# Patient Record
Sex: Female | Born: 1975 | Race: White | Hispanic: No | Marital: Married | Smoking: Never smoker
Health system: Southern US, Community
[De-identification: ages and names within clinical notes are randomized; demographics above are authoritative.]

## PROBLEM LIST (undated history)

## (undated) DIAGNOSIS — J349 Unspecified disorder of nose and nasal sinuses: Secondary | ICD-10-CM

## (undated) DIAGNOSIS — R112 Nausea with vomiting, unspecified: Secondary | ICD-10-CM

## (undated) DIAGNOSIS — R519 Headache, unspecified: Secondary | ICD-10-CM

## (undated) DIAGNOSIS — F419 Anxiety disorder, unspecified: Secondary | ICD-10-CM

## (undated) DIAGNOSIS — Z9889 Other specified postprocedural states: Secondary | ICD-10-CM

## (undated) DIAGNOSIS — R42 Dizziness and giddiness: Secondary | ICD-10-CM

## (undated) DIAGNOSIS — N200 Calculus of kidney: Secondary | ICD-10-CM

## (undated) DIAGNOSIS — K219 Gastro-esophageal reflux disease without esophagitis: Secondary | ICD-10-CM

## (undated) DIAGNOSIS — R55 Syncope and collapse: Secondary | ICD-10-CM

## (undated) DIAGNOSIS — R51 Headache: Secondary | ICD-10-CM

## (undated) DIAGNOSIS — K625 Hemorrhage of anus and rectum: Secondary | ICD-10-CM

## (undated) HISTORY — DX: Headache: R51

## (undated) HISTORY — DX: Syncope and collapse: R55

## (undated) HISTORY — DX: Dizziness and giddiness: R42

## (undated) HISTORY — PX: DILATION AND CURETTAGE OF UTERUS: SHX78

## (undated) HISTORY — DX: Nausea with vomiting, unspecified: R11.2

## (undated) HISTORY — DX: Unspecified disorder of nose and nasal sinuses: J34.9

## (undated) HISTORY — PX: APPENDECTOMY: SHX54

## (undated) HISTORY — DX: Gastro-esophageal reflux disease without esophagitis: K21.9

## (undated) HISTORY — DX: Anxiety disorder, unspecified: F41.9

## (undated) HISTORY — DX: Headache, unspecified: R51.9

## (undated) HISTORY — DX: Hemorrhage of anus and rectum: K62.5

---

## 2001-11-18 ENCOUNTER — Encounter: Payer: Self-pay | Admitting: Emergency Medicine

## 2001-11-18 ENCOUNTER — Emergency Department (HOSPITAL_COMMUNITY): Admission: EM | Admit: 2001-11-18 | Discharge: 2001-11-18 | Payer: Self-pay | Admitting: Emergency Medicine

## 2002-03-21 ENCOUNTER — Ambulatory Visit (HOSPITAL_COMMUNITY): Admission: AD | Admit: 2002-03-21 | Discharge: 2002-03-21 | Payer: Self-pay | Admitting: Obstetrics and Gynecology

## 2002-03-22 ENCOUNTER — Encounter (INDEPENDENT_AMBULATORY_CARE_PROVIDER_SITE_OTHER): Payer: Self-pay

## 2002-03-22 ENCOUNTER — Encounter: Payer: Self-pay | Admitting: Obstetrics and Gynecology

## 2002-09-02 ENCOUNTER — Inpatient Hospital Stay (HOSPITAL_COMMUNITY): Admission: AD | Admit: 2002-09-02 | Discharge: 2002-09-02 | Payer: Self-pay | Admitting: Obstetrics and Gynecology

## 2002-09-03 ENCOUNTER — Encounter: Payer: Self-pay | Admitting: Obstetrics and Gynecology

## 2002-10-04 ENCOUNTER — Observation Stay (HOSPITAL_COMMUNITY): Admission: AD | Admit: 2002-10-04 | Discharge: 2002-10-05 | Payer: Self-pay | Admitting: Obstetrics and Gynecology

## 2002-10-05 ENCOUNTER — Other Ambulatory Visit: Admission: RE | Admit: 2002-10-05 | Discharge: 2002-10-05 | Payer: Self-pay | Admitting: Obstetrics and Gynecology

## 2002-11-14 ENCOUNTER — Ambulatory Visit (HOSPITAL_COMMUNITY): Admission: RE | Admit: 2002-11-14 | Discharge: 2002-11-14 | Payer: Self-pay | Admitting: Obstetrics and Gynecology

## 2002-11-14 ENCOUNTER — Encounter: Payer: Self-pay | Admitting: Obstetrics and Gynecology

## 2002-12-12 ENCOUNTER — Ambulatory Visit (HOSPITAL_COMMUNITY): Admission: RE | Admit: 2002-12-12 | Discharge: 2002-12-12 | Payer: Self-pay | Admitting: Obstetrics and Gynecology

## 2002-12-12 ENCOUNTER — Encounter: Payer: Self-pay | Admitting: Obstetrics and Gynecology

## 2003-04-06 ENCOUNTER — Inpatient Hospital Stay (HOSPITAL_COMMUNITY): Admission: AD | Admit: 2003-04-06 | Discharge: 2003-04-08 | Payer: Self-pay | Admitting: Obstetrics and Gynecology

## 2003-08-30 ENCOUNTER — Ambulatory Visit (HOSPITAL_COMMUNITY): Admission: RE | Admit: 2003-08-30 | Discharge: 2003-08-30 | Payer: Self-pay | Admitting: Family Medicine

## 2004-06-04 ENCOUNTER — Ambulatory Visit (HOSPITAL_COMMUNITY): Admission: RE | Admit: 2004-06-04 | Discharge: 2004-06-04 | Payer: Self-pay | Admitting: Otolaryngology

## 2006-05-21 ENCOUNTER — Emergency Department (HOSPITAL_COMMUNITY): Admission: EM | Admit: 2006-05-21 | Discharge: 2006-05-21 | Payer: Self-pay | Admitting: Emergency Medicine

## 2006-05-25 ENCOUNTER — Encounter (HOSPITAL_COMMUNITY): Admission: RE | Admit: 2006-05-25 | Discharge: 2006-06-24 | Payer: Self-pay | Admitting: Internal Medicine

## 2007-03-30 ENCOUNTER — Ambulatory Visit: Payer: Self-pay | Admitting: Internal Medicine

## 2007-03-31 ENCOUNTER — Ambulatory Visit (HOSPITAL_COMMUNITY): Admission: RE | Admit: 2007-03-31 | Discharge: 2007-03-31 | Payer: Self-pay | Admitting: Internal Medicine

## 2007-04-20 ENCOUNTER — Ambulatory Visit: Payer: Self-pay | Admitting: Internal Medicine

## 2007-10-07 DIAGNOSIS — K219 Gastro-esophageal reflux disease without esophagitis: Secondary | ICD-10-CM

## 2007-10-07 DIAGNOSIS — F319 Bipolar disorder, unspecified: Secondary | ICD-10-CM

## 2007-10-07 DIAGNOSIS — R197 Diarrhea, unspecified: Secondary | ICD-10-CM

## 2007-10-10 DIAGNOSIS — R1012 Left upper quadrant pain: Secondary | ICD-10-CM

## 2008-03-22 ENCOUNTER — Emergency Department (HOSPITAL_COMMUNITY): Admission: EM | Admit: 2008-03-22 | Discharge: 2008-03-23 | Payer: Self-pay | Admitting: Emergency Medicine

## 2010-01-03 ENCOUNTER — Inpatient Hospital Stay (HOSPITAL_COMMUNITY): Admission: AD | Admit: 2010-01-03 | Discharge: 2010-01-03 | Payer: Self-pay | Admitting: Obstetrics and Gynecology

## 2010-02-20 ENCOUNTER — Inpatient Hospital Stay (HOSPITAL_COMMUNITY)
Admission: AD | Admit: 2010-02-20 | Discharge: 2010-02-20 | Payer: Self-pay | Source: Home / Self Care | Attending: Obstetrics and Gynecology | Admitting: Obstetrics and Gynecology

## 2010-03-05 ENCOUNTER — Emergency Department (HOSPITAL_COMMUNITY)
Admission: EM | Admit: 2010-03-05 | Discharge: 2010-03-05 | Payer: Self-pay | Source: Home / Self Care | Admitting: Emergency Medicine

## 2010-03-30 ENCOUNTER — Encounter: Payer: Self-pay | Admitting: Gastroenterology

## 2010-03-30 ENCOUNTER — Encounter: Payer: Self-pay | Admitting: Otolaryngology

## 2010-05-19 LAB — BASIC METABOLIC PANEL
BUN: 5 mg/dL — ABNORMAL LOW (ref 6–23)
Calcium: 9 mg/dL (ref 8.4–10.5)
GFR calc non Af Amer: >60 mL/min (ref >60)
Potassium: 3.9 mEq/L (ref 3.5–5.1)
Sodium: 135 mEq/L (ref 135–145)

## 2010-05-19 LAB — URINALYSIS, ROUTINE W REFLEX MICROSCOPIC
Nitrite: NEGATIVE
Nitrite: NEGATIVE
Specific Gravity, Urine: 1.025 (ref 1.005–1.030)
Specific Gravity, Urine: 1.02 (ref 1.005–1.030)
Urobilinogen, UA: 0.2 mg/dL (ref 0.0–1.0)
pH: 6.5 (ref 5.0–8.0)

## 2010-05-19 LAB — CBC
Hemoglobin: 12.8 g/dL (ref 12.0–15.0)
MCHC: 34.9 g/dL (ref 30.0–36.0)
Platelets: 210 10*3/uL (ref 150–400)
RBC: 4.42 MIL/uL (ref 3.87–5.11)

## 2010-05-19 LAB — DIFFERENTIAL
Basophils Relative: 0 % (ref 0–1)
Monocytes Relative: 6 % (ref 3–12)
Neutro Abs: 5.7 10*3/uL (ref 1.7–7.7)
Neutrophils Relative %: 70 % (ref 43–77)

## 2010-05-21 LAB — URINALYSIS, ROUTINE W REFLEX MICROSCOPIC
Hgb urine dipstick: NEGATIVE
Nitrite: NEGATIVE
Protein, ur: NEGATIVE mg/dL
Specific Gravity, Urine: 1.02 (ref 1.005–1.030)
Urobilinogen, UA: 0.2 mg/dL (ref 0.0–1.0)

## 2010-05-21 LAB — POCT PREGNANCY, URINE

## 2010-06-23 LAB — PREGNANCY, URINE: Preg Test, Ur: NEGATIVE

## 2010-06-23 LAB — URINALYSIS, ROUTINE W REFLEX MICROSCOPIC
Bilirubin Urine: NEGATIVE
Hgb urine dipstick: NEGATIVE
Nitrite: NEGATIVE
Protein, ur: NEGATIVE mg/dL
Specific Gravity, Urine: 1.02 (ref 1.005–1.030)
Urobilinogen, UA: 0.2 mg/dL (ref 0.0–1.0)

## 2010-07-21 ENCOUNTER — Inpatient Hospital Stay (HOSPITAL_COMMUNITY): Payer: BC Managed Care – PPO

## 2010-07-21 ENCOUNTER — Inpatient Hospital Stay (HOSPITAL_COMMUNITY)
Admission: AD | Admit: 2010-07-21 | Discharge: 2010-07-21 | Disposition: A | Discharge status: Home / Self Care | Payer: BC Managed Care – PPO | Source: Ambulatory Visit | Attending: Obstetrics and Gynecology | Admitting: Obstetrics and Gynecology

## 2010-07-21 DIAGNOSIS — R109 Unspecified abdominal pain: Secondary | ICD-10-CM

## 2010-07-21 DIAGNOSIS — O9989 Other specified diseases and conditions complicating pregnancy, childbirth and the puerperium: Secondary | ICD-10-CM

## 2010-07-21 DIAGNOSIS — W1809XA Striking against other object with subsequent fall, initial encounter: Secondary | ICD-10-CM | POA: Insufficient documentation

## 2010-07-21 DIAGNOSIS — O99891 Other specified diseases and conditions complicating pregnancy: Secondary | ICD-10-CM | POA: Insufficient documentation

## 2010-07-21 LAB — URINALYSIS, ROUTINE W REFLEX MICROSCOPIC
Bilirubin Urine: NEGATIVE
Glucose, UA: NEGATIVE mg/dL
Hgb urine dipstick: NEGATIVE
Ketones, ur: NEGATIVE mg/dL
Protein, ur: NEGATIVE mg/dL
Urobilinogen, UA: 0.2 mg/dL (ref 0.0–1.0)

## 2010-07-21 LAB — CBC
Hemoglobin: 10.8 g/dL — ABNORMAL LOW (ref 12.0–15.0)
MCH: 26.5 pg (ref 26.0–34.0)
MCV: 82.6 fL (ref 78.0–100.0)
Platelets: 189 10*3/uL (ref 150–400)
RBC: 4.08 MIL/uL (ref 3.87–5.11)
WBC: 8.2 10*3/uL (ref 4.0–10.5)

## 2010-07-22 NOTE — Assessment & Plan Note (Signed)
NAME:  [name], [name]                  CHART#:  [id number]   DATE:  03/30/2007                       DOB:  [birth year]   REASON FOR CONSULTATION:  Abdominal pain, chronic diarrhea.   PHYSICIAN REQUESTING CONSULTATION:  Kirk Ruths, M.D.   PHYSICIAN COSIGNING NOTE:  Roetta Sessions, M.D.   HISTORY OF PRESENT ILLNESS:  [name] is a 35 year old Caucasian female who  presents today for further evaluation of abdominal pain.  She also has  chronic diarrhea.  She states over the last one year she has had  worsening bowel movements.  She is having three and four loose stools a  day, sometimes more.  She complains of nocturnal diarrhea as well.  Every time she eats she gets bloated.  Certain foods seem to trigger it.  She has abdominal discomfort and fecal urgency.  Sometimes she has  diarrhea almost immediately postprandially.  She has had mucus in the  stool, but denies any blood or melena.  She notes that if she consumes  any alcoholic beverages, she will have a lot of abdominal pain and  diarrhea.  She really has not consumed any alcohol in over a year  because of this.  She has never been a heavy drinker however.  If she  eats greasy foods, the symptoms seem to be worse.  This is also  associated with post prandial left upper quadrant abdominal pain, which  may last for 30 minutes at a time and is intermittent and colicky.   Workup this far included an abdominal ultrasound last year, which  revealed a 9 mm splenic cyst.  She had a HIDA scan, which was  unremarkable.  She has not had any recent labs, but she did have a  normal TSH in August 2008, normal CBC in March 2008.  Her LFT's were  normal except for an ALT of 70.  Her amylase 20, lipase less than 10.  She denies any heartburn but does state she has a lot of indigestion.  She has tried Nexium, Tagamet and Prilosec OTC for abdominal discomfort  without any relief.  She never has any solid stools.  She says as long  as she can  remember, she has always had abdominal issues and loose  stools.  It really has been worse in the last one year.   CURRENT MEDICATIONS:  1. Xanax occasionally, approximately once or twice a month.  2. Tylenol p.r.n.  3. Tums t.i.d. p.r.n.   ALLERGIES:  No known drug allergies.   PAST MEDICAL HISTORY:  1. Gastroesophageal reflux disease.  2. She states she has been diagnosed with bipolar disorder      approximately four years ago after her daughter was born.  She has      seen a psychiatrist.  She was on Depakote and Lithium for years,      but has been off for about one year and seems to be doing well.      Medications were weaned because of all of her GI issues.  3. She has had an appendectomy and D&C.   FAMILY HISTORY:  Mother is 21, has hypertension, hypercholesterolemia.  Father is 92 and healthy.  She has a sister with IBS.  Her maternal aunt  was reportedly diagnosed with Crohn's disease around the age of 10.  However,  was eventually given diagnosis of ulcerative colitis instead.  In her early 20's she developed colorectal cancer and has required a  colostomy.  She had some sort of complications in her mid 20's requiring  a liver transplant.  She ended up with kidney failure related to her  medications.  This  required one kidney transplant and is in the process  of being worked up for a second kidney transplant at this time.   SOCIAL HISTORY:  She is married.  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  She has never been a smoker.  Occasional alcohol use,  but really none in the last one year.   REVIEW OF SYSTEMS:  See HPI for GI.   CONSTITUTIONAL:  She reports a 100 pound weight gain in about eight  months a couple of years ago when she started taking antidepressants and  Depakote.  Denies any recent weight loss.  CARDIOPULMONARY:  Denies chest pain or shortness of breath.  GENITOURINARY:  Denies any dysuria, hematuria.   PHYSICAL EXAMINATION:  Weight:   275.  Height:  5 feet 7 inches.  Temperature:  98.  Blood pressure:  102/80.  Pulse:  60,  GENERAL:  Pleasant, morbidly obese Caucasian female in no acute  distress.  SKIN:  Warm and dry.  No jaundice.  HEENT:  Sclerae nonicteric.  Oropharyngeal:  Mucosa moist and pink.  No  lesions, erythema  or exudate.  No lymphadenopathy or thyromegaly.  CHEST:  Lungs clear to auscultation.  CARDIAC:  Reveals regular rate and rhythm.  Normal S1 and S2.  No  murmurs, rubs or gallops.  ABDOMEN:  Positive bowel sounds.  Obese but symmetrical.  Soft.  She has  mild lower abdominal tenderness more so on the right lower quadrant to  deep palpation.  No rebound, tenderness or guarding.  No abdominal  bruits or hernias.  No hepatosplenomegaly or masses appreciated but  limited due to body habitus.  LOWER EXTREMITIES:  No edema.   On February 11, 2007 a urine culture was negative.   IMPRESSION:  [name] is a 35 year old lady with multiple gastrointestinal  symptoms.  She complains mostly of approximately a one year history of  chronic diarrhea with nocturnal diarrhea as well.  This is associated  with lower abdominal discomfort and pain.  Symptoms are often  postprandial.  Cannot exclude possibility of irritable bowel syndrome,  however nocturnal diarrhea would be a red flag.  She also has a vague  left upper quadrant abdominal pain postprandially, which may be due to  colonic spasm.  I doubt secondary to pancreatitis.  Gall bladder  appeared normal on the ultrasound last year.  She had mildly elevated  transaminases, which is most likely due to fatty liver but will be  further evaluated.  Previously did have thrombocytopenia during a workup  in the emergency department.  We rechecked this as well.  Family history  is quite intriguing with the aunt who had a history of ulcerative  colitis and colorectal cancer at early age and a liver transplantation  as well.   PLAN:  1. CBC, C-MET, amylase, lipase,  celiac disease, metabolic panel.  2. Hemoccult stool x3.  3. CT abdomen and pelvis with IV and oral contrast.  4. Irritable bowel syndrome literature provided.  5. IBS Advantage 1 daily #30.  Samples provided.  6. If CT and labs are unremarkable, would consider pursuing      colonoscopy as  next step.   I would like to thank Dr. Yetta Numbers for allowing [place] to take part in  the care of this patient.       Tana Coast, P.A.  Electronically Signed    ______________________________  Onnie Boer, M.D.    LL/MEDQ  D:  03/30/2007  T:  03/30/2007  Job:  [id number]   cc:   Kirk Ruths, M.D.

## 2010-07-25 NOTE — H&P (Signed)
NAME:  [name], [name]                           ACCOUNT NO.:  [id number]   MEDICAL RECORD NO.:  [id number]                   PATIENT TYPE:  OBV   LOCATION:  9129                                 FACILITY:  [facility]   PHYSICIAN:  Zenaida Niece, M.D.             DATE OF BIRTH:  07-Oct-1975   DATE OF ADMISSION:  10/04/2002  DATE OF DISCHARGE:                                HISTORY & PHYSICAL   CHIEF COMPLAINT:  Nausea and vomiting with pregnancy.   HISTORY OF PRESENT ILLNESS:  This is a 35 year old white female gravida 2,  para 0-0-1-0 who is approximately [redacted] weeks pregnant with a due date of  April 12, 2002, by a nine week ultrasound who calls with the complaint of  persistent nausea and vomiting, unable to keep down fluids.  She has had  nausea and vomiting off and one which has been treated with Zofran and  Phenergan.  She says the Phenergan pills are not helping at all and at that  point she was not taking Zofran and she felt like she was significantly  dehydrated.  She was sent to maternity admissions for IV fluids.  At the end  of her IV fluids, she did feel better; however, she discussed the situation  with Dr. Ambrose Mantle and they agreed to observe her overnight and continue IV  fluids.  Prenatal care has also been complicated by a visit to maternity  admissions on September 03, 2002, for nausea and vomiting and right upper  quadrant pain that resolved with IV fluids and antiemetics as well as  Pepcid.   MEDICATIONS:  1. Zofran p.r.n.  2. Phenergan p.r.n.  3. Luvox.   ALLERGIES:  No known drug allergies.   PAST OBSTETRICAL HISTORY:  One spontaneous abortion.   PAST MEDICAL HISTORY:  1. History of obsessive compulsive disorder.  2. Obesity.   PAST SURGICAL HISTORY:  1. D&C.  2. Appendectomy.   SOCIAL HISTORY:  The patient is married and denies alcohol, tobacco, or drug  use.   PHYSICAL EXAMINATION:  VITAL SIGNS:  She was afebrile with stable vital  signs.  NECK:  Supple  without lymphadenopathy or thyromegaly.  LUNGS:  Clear to auscultation.  CARDIOVASCULAR:  Regular rate and rhythm without murmur.  ABDOMEN:  Soft, nontender and nondistended without palpable masses.  PELVIC:  Examination is deferred.    ASSESSMENT:  Intrauterine pregnancy at approximately 12 weeks with  persistent nausea and vomiting of pregnancy and mild dehydration.  The  patient has had IV fluids and feels that she would benefit from continued  observation.   PLAN:  Admit the patient for observation and continue IV fluids and  antiemetics and reevaluate in the morning.  Zenaida Niece, M.D.    TDM/MEDQ  D:  10/05/2002  T:  10/05/2002  Job:  [id number]

## 2010-07-25 NOTE — Op Note (Signed)
   NAME:  [name], [name] NO.:  [id number]   MEDICAL RECORD NO.:  [id number]                   PATIENT TYPE:  AMB   LOCATION:  [facility]                                 FACILITY:  [facility]   PHYSICIAN:  Zenaida Niece, M.D.             DATE OF BIRTH:  Aug 24, 1975   DATE OF PROCEDURE:  03/22/2002  DATE OF DISCHARGE:                                 OPERATIVE REPORT   PREOPERATIVE DIAGNOSIS:  Incomplete abortion.   POSTOPERATIVE DIAGNOSIS:  Incomplete abortion.   PROCEDURE:  Dilatation and curettage.   SURGEON:  Zenaida Niece, M.D.   ANESTHESIA:  Monitored anesthesia care and paracervical block.   ESTIMATED BLOOD LOSS:  50 cc.   FINDINGS:  A moderate amount of products of conception obtained.   PROCEDURE IN DETAIL:  The patient was taken to the operating room and placed  in a dorsal supine position.  She was given IV sedation and placed in mobile  stirrups. The perineum and vagina were prepped and draped in the usual  sterile fashion and her bladder drained with a red rubber catheter.  A  bivalved speculum was inserted into the vagina and the anterior cervix  grasped with a single tooth tenaculum.  A paracervical block was then  performed with a total of 16 cc of 2% lidocaine plain.  The uterus was  sounded to 8 cm.  The cervix was immediately dilated to a size 31 dilator.  Suction curettage was performed with a size 8 curved curette, which returned  moderate products of conception.  Sharp curettage revealed good uterine cry  in all quadrants and no further tissue. Suction curettage revealed a small  amount of blood.  The single tooth tenaculum was removed and bleeding  controlled with pressure.  All instruments were then removed from the  vagina.  The patient tolerated the procedure well and was taken to the  recovery room in a stable condition.  Counts were correct.                                               Zenaida Niece, M.D.    TDM/MEDQ  D:  03/22/2002  T:  03/22/2002  Job:  [id number]

## 2010-07-25 NOTE — Discharge Summary (Signed)
NAME:  [name], [name]                           ACCOUNT NO.:  [id number]   MEDICAL RECORD NO.:  [id number]                   PATIENT TYPE:  INP   LOCATION:  9145                                 [facility]ACILITY:  [facility]   PHYSICIAN:  Malachi Pro. Ambrose Mantle, M.D.              DATE OF BIRTH:  Apr 04, 1975   DATE OF ADMISSION:  04/06/2003  DATE OF DISCHARGE:                                 DISCHARGE SUMMARY   REASON FOR ADMISSION:  This is a white female Para 0-0-1-0, Gravida II, last  period Jul 10, 2002, [facility] April 13, 2003 by ultrasound admitted with  ruptured membranes.  Blood group and type O positive.  Negative antibody.  Non-reactive serology.  Rubella non-immune. Hepatitis B surface antigen  negative.  GC and Chlamydia negative.  AFB borderline increased at 2.45  multiples of the media.  An ultrasound was normal at 18 and 22 weeks.  One  hour Glucola 122.  Group B Strep negative.  Vaginal ultrasound on September 12, 2002:  Crown rumpling at 2.79 cm, 9 weeks 4 days, [facility] April 13, 2003.  She  was treated with Phenergan, Zofran and Reglan in early pregnancy for nausea  and vomiting.  She also had Xanax, Benadryl and Ambien in early pregnancy.  She used ferrous sulfate for anemia and Darvocet for pain.  She had ruptured  membranes on the day of admission at approximately 7 a.m.  She came to  maternity admission and ruptured membranes was confirmed.  An ultrasound on  February 20, 2003 with size greater than dates showed an estimated fetal  weight of 2394 grams, 95th percentile, 32 weeks.   PAST MEDICAL HISTORY:  No known allergies.   PAST SURGICAL HISTORY:  1. In 1992 she had an appendectomy.   ILLNESSES:  1. Obsessive compulsive disorder.  2. Migraines.   HABITS:  Alcohol, tobacco and drugs - none.   FAMILY HISTORY:  Mother with thyroid dysfunction and migraines.  Maternal  grandfather with high blood pressure.  Paternal grandfather with diabetes.   PHYSICAL EXAMINATION ON ADMISSION:  VITAL  SIGNS:  Normal except for a pulse  of 115.  HEART:  Normal sinus tachycardia with no murmurs.  LUNGS:  Clear to percussion and auscultation.  ABDOMEN:  Soft.  Fundal height had been 40 cm on April 01, 2003.  Fetal  heart tones were normal.  There were no decelerations.  PELVIC:  Cervix was 2 to 3 cm, 70%, vertex and -2.   HOSPITAL COURSE:  The patient was placed on Pitocin at 6:15 p.m. and she had  received an epidural at 3 p.m. with the cervix 4 to 5 cm.  At 4:45 p.m., the  nurse felt the cervix was 4 cm.  Pitocin was at 14 milliunits a minute and  at 6:15 p.m. the cervix was 5 to 6 cm.  The patient became fully dilated and  pushed well, but had  variable decelerations with good recovery.  The vertex  reached the perineum and the patient delivered spontaneously OA over a left  sulcus, right vaginal perineal and bilateral labial lacerations a living  female weighing 8 pounds 7 ounces, Apgars of 9 at one and 9 at five minutes.  Dr. Ambrose Mantle was in attendance. The placenta was intact.  I could not reach  the fundus of the uterus.  The rectum was negative. All lacerations were  repaired with 3-0 Vicryl.  Blood loss was about 500 mL.  There was one loop  of nuchal cord.  Postpartum the patient did well and was discharged on the  second postpartum day.   LABORATORY DATA:  TSH done because of a persistent tachycardia was 2.581.  Initial hemoglobin was 10.8 with a follow-up of 9.6, white count 9700 with a  follow-up of 15,700 and a normal platelet count.  Non-reactive serology.   Over the last eight hours the patient's pulses have been recorded as 92 and  94.   FINAL DIAGNOSES:  1. Intrauterine pregnancy at 39 weeks, delivered OA.   PROCEDURES:  1. Spontaneous delivery, OA.  2. Repair of vaginal, perineal and labial lacerations.   FINAL CONDITION:  Improved.   DISCHARGE INSTRUCTIONS:  Regular discharge instruction booklet.   FOLLOW UP:  The patient is advised to return to the office  in six weeks with  follow-up examination.  She is given a printed discharge instruction  booklet.   DISCHARGE MEDICATIONS:  1. Prescription for Percocet 5/325 mg, 20 tablets, one every four to six     hours as needed for pain.                                               Malachi Pro. Ambrose Mantle, M.D.    TFH/MEDQ  D:  04/08/2003  T:  04/08/2003  Job:  [id number]

## 2010-07-25 NOTE — Discharge Summary (Signed)
   NAME:  [name], [name]                           ACCOUNT NO.:  [id number]   MEDICAL RECORD NO.:  [id number]                   PATIENT TYPE:  OBV   LOCATION:  9129                                 [facility]ILITY:  [facility]   PHYSICIAN:  Zenaida Niece, M.D.             DATE OF BIRTH:  Sep 03, 1975   DATE OF ADMISSION:  10/04/2002  DATE OF DISCHARGE:  10/05/2002                                 DISCHARGE SUMMARY   ADMISSION DIAGNOSES:  1. Intrauterine pregnancy at approximately 12 weeks.  2. Nausea and vomiting with pregnancy with dehydration.   DISCHARGE DIAGNOSES:  1. Intrauterine pregnancy at approximately 12 weeks.  2. Nausea and vomiting with pregnancy with dehydration.   PROCEDURE:  Intravenous fluids and antiemetics.   HISTORY AND PHYSICAL:  Please see chart for full history and physical.  Briefly this is a 35 year old white female, gravida 2, para 0-0-1-0 with an  EGA of approximately 12 weeks who presents with persistent nausea and  vomiting despite prescription medications at home.  Prenatal care has  otherwise been uncomplicated, and she is due for her new OB workup at the  office.   PHYSICAL EXAMINATION:  Unremarkable.   HOSPITAL COURSE:  The patient received IV fluids and maternity admission,  but then discussed with Dr. Ambrose Mantle and they agreed to monitor her overnight.  Overnight she continued to receive IV fluids and phenergan suppositories.  Phenergan suppositories were significantly helpful.  On the morning on  10/05/2002 she was able to tolerate food and keep down liquids and felt that  she would be okay with Phenergan suppositories.   DISCHARGE DIET:  As tolerated.   DISCHARGE ACTIVITIES:  As tolerated.   FOLLOWUP:  Followup later on the afternoon of discharge for her OB workup.   MEDICATIONS:  Phenergan suppositories 25 mg #20 1 per rectum q.6h. p.r.n.  with two refills.                                                Zenaida Niece, M.D.    TDM/MEDQ  D:   10/05/2002  T:  10/05/2002  Job:  [id number]

## 2010-08-03 ENCOUNTER — Inpatient Hospital Stay (HOSPITAL_COMMUNITY)
Admission: AD | Admit: 2010-08-03 | Discharge: 2010-08-03 | Disposition: A | Discharge status: Home / Self Care | Payer: BC Managed Care – PPO | Source: Ambulatory Visit | Attending: Obstetrics and Gynecology | Admitting: Obstetrics and Gynecology

## 2010-08-03 DIAGNOSIS — O99891 Other specified diseases and conditions complicating pregnancy: Secondary | ICD-10-CM | POA: Insufficient documentation

## 2010-08-03 DIAGNOSIS — R197 Diarrhea, unspecified: Secondary | ICD-10-CM

## 2010-08-03 DIAGNOSIS — O9989 Other specified diseases and conditions complicating pregnancy, childbirth and the puerperium: Secondary | ICD-10-CM

## 2010-08-03 LAB — URINALYSIS, ROUTINE W REFLEX MICROSCOPIC
Bilirubin Urine: NEGATIVE
Glucose, UA: NEGATIVE mg/dL
Ketones, ur: NEGATIVE mg/dL
Nitrite: NEGATIVE
Specific Gravity, Urine: 1.01 (ref 1.005–1.030)
pH: 7.5 (ref 5.0–8.0)

## 2010-08-12 ENCOUNTER — Inpatient Hospital Stay (HOSPITAL_COMMUNITY)
Admission: AD | Admit: 2010-08-12 | Discharge: 2010-08-12 | Disposition: A | Discharge status: Home / Self Care | Payer: BC Managed Care – PPO | Source: Ambulatory Visit | Attending: Obstetrics and Gynecology | Admitting: Obstetrics and Gynecology

## 2010-08-12 DIAGNOSIS — O479 False labor, unspecified: Secondary | ICD-10-CM | POA: Insufficient documentation

## 2010-08-12 DIAGNOSIS — O368190 Decreased fetal movements, unspecified trimester, not applicable or unspecified: Secondary | ICD-10-CM | POA: Insufficient documentation

## 2010-08-13 ENCOUNTER — Inpatient Hospital Stay (HOSPITAL_COMMUNITY)
Admission: AD | Admit: 2010-08-13 | Discharge: 2010-08-14 | Disposition: A | Discharge status: Home / Self Care | Payer: BC Managed Care – PPO | Source: Ambulatory Visit | Attending: Obstetrics and Gynecology | Admitting: Obstetrics and Gynecology

## 2010-08-13 DIAGNOSIS — R109 Unspecified abdominal pain: Secondary | ICD-10-CM | POA: Insufficient documentation

## 2010-08-13 DIAGNOSIS — O99891 Other specified diseases and conditions complicating pregnancy: Secondary | ICD-10-CM | POA: Insufficient documentation

## 2010-08-13 DIAGNOSIS — R51 Headache: Secondary | ICD-10-CM | POA: Insufficient documentation

## 2010-08-19 ENCOUNTER — Inpatient Hospital Stay (HOSPITAL_COMMUNITY)
Admission: AD | Admit: 2010-08-19 | Payer: BC Managed Care – PPO | Source: Ambulatory Visit | Admitting: Obstetrics and Gynecology

## 2010-08-19 ENCOUNTER — Encounter (HOSPITAL_COMMUNITY): Payer: Self-pay | Admitting: *Deleted

## 2010-08-20 ENCOUNTER — Inpatient Hospital Stay (HOSPITAL_COMMUNITY)
Admission: RE | Admit: 2010-08-20 | Discharge: 2010-08-22 | DRG: 372 | Disposition: A | Discharge status: Home / Self Care | Payer: BC Managed Care – PPO | Source: Ambulatory Visit | Attending: Obstetrics and Gynecology | Admitting: Obstetrics and Gynecology

## 2010-08-20 DIAGNOSIS — O3660X0 Maternal care for excessive fetal growth, unspecified trimester, not applicable or unspecified: Principal | ICD-10-CM | POA: Diagnosis present

## 2010-08-20 LAB — CBC
HCT: 36.3 % (ref 36.0–46.0)
Hemoglobin: 11.8 g/dL — ABNORMAL LOW (ref 12.0–15.0)
MCHC: 32.5 g/dL (ref 30.0–36.0)
RBC: 4.49 MIL/uL (ref 3.87–5.11)

## 2010-08-20 LAB — RPR: RPR Ser Ql: NONREACTIVE

## 2010-08-21 LAB — CBC
HCT: 31 % — ABNORMAL LOW (ref 36.0–46.0)
Hemoglobin: 10.1 g/dL — ABNORMAL LOW (ref 12.0–15.0)
RBC: 3.86 MIL/uL — ABNORMAL LOW (ref 3.87–5.11)

## 2010-08-21 LAB — ABO/RH: ABO/RH(D): O POS

## 2010-11-05 ENCOUNTER — Encounter (HOSPITAL_COMMUNITY): Payer: Self-pay

## 2010-11-05 ENCOUNTER — Emergency Department (HOSPITAL_COMMUNITY)
Admission: EM | Admit: 2010-11-05 | Discharge: 2010-11-05 | Disposition: A | Discharge status: Home / Self Care | Payer: BC Managed Care – PPO | Attending: Emergency Medicine | Admitting: Emergency Medicine

## 2010-11-05 DIAGNOSIS — R000 Tachycardia, unspecified: Secondary | ICD-10-CM | POA: Insufficient documentation

## 2010-11-05 DIAGNOSIS — B9789 Other viral agents as the cause of diseases classified elsewhere: Secondary | ICD-10-CM | POA: Insufficient documentation

## 2010-11-05 DIAGNOSIS — R509 Fever, unspecified: Secondary | ICD-10-CM | POA: Insufficient documentation

## 2010-11-05 DIAGNOSIS — Z87442 Personal history of urinary calculi: Secondary | ICD-10-CM | POA: Insufficient documentation

## 2010-11-05 DIAGNOSIS — B349 Viral infection, unspecified: Secondary | ICD-10-CM

## 2010-11-05 DIAGNOSIS — R51 Headache: Secondary | ICD-10-CM

## 2010-11-05 HISTORY — DX: Calculus of kidney: N20.0

## 2010-11-05 LAB — URINALYSIS, ROUTINE W REFLEX MICROSCOPIC
Glucose, UA: NEGATIVE mg/dL
Hgb urine dipstick: NEGATIVE
Leukocytes, UA: NEGATIVE
Specific Gravity, Urine: 1.02 (ref 1.005–1.030)
pH: 6 (ref 5.0–8.0)

## 2010-11-05 LAB — PREGNANCY, URINE: Preg Test, Ur: NEGATIVE

## 2010-11-05 LAB — POCT PREGNANCY, URINE: Preg Test, Ur: NEGATIVE

## 2010-11-05 MED ORDER — OXYCODONE-ACETAMINOPHEN 5-325 MG PO TABS: 1.0000 | ORAL_TABLET | ORAL | Status: AC | PRN

## 2010-11-05 MED ORDER — IBUPROFEN 800 MG PO TABS: 800.0000 mg | ORAL_TABLET | Freq: Three times a day (TID) | ORAL | Status: AC

## 2010-11-05 MED ORDER — SODIUM CHLORIDE 0.9 % IV BOLUS (SEPSIS)
1000.0000 mL | Freq: Once | INTRAVENOUS | Status: AC
  Administered 2010-11-05: 1000 mL via INTRAVENOUS

## 2010-11-05 MED ORDER — ONDANSETRON 4 MG PO TBDP: 4.0000 mg | ORAL_TABLET | Freq: Three times a day (TID) | ORAL | Status: AC | PRN

## 2010-11-05 MED ORDER — ONDANSETRON HCL 4 MG/2ML IJ SOLN
4.0000 mg | Freq: Once | INTRAMUSCULAR | Status: AC
  Administered 2010-11-05: 4 mg via INTRAVENOUS
  Filled 2010-11-05: qty 2

## 2010-11-05 MED ORDER — KETOROLAC TROMETHAMINE 30 MG/ML IJ SOLN
30.0000 mg | Freq: Once | INTRAMUSCULAR | Status: AC
  Administered 2010-11-05: 30 mg via INTRAVENOUS
  Filled 2010-11-05: qty 1

## 2010-11-05 MED ORDER — OXYCODONE-ACETAMINOPHEN 5-325 MG PO TABS
2.0000 | ORAL_TABLET | Freq: Once | ORAL | Status: AC
  Administered 2010-11-05: 2 via ORAL
  Filled 2010-11-05: qty 2

## 2010-11-05 NOTE — ED Notes (Signed)
C/o rt lower back pain for 3 days along with urinary frequency and new onset of fever today.

## 2010-11-05 NOTE — ED Provider Notes (Signed)
History   Scribed for Vida Roller, MD, the patient was seen in room APA08/APA08. This chart was scribed by Clarita Crane. This patient's care was started at 11:48AM.   CSN: [id number] Arrival date & time: 11/05/2010 11:39 AM  Chief Complaint  Patient presents with  . Back Pain  . Nausea   HPI [name] is a 35 y.o. female who presents to the Emergency Department complaining of constant lower back pain to bilateral sides of back onset 3 days ago and persistent since with associated nausea, multiple episodes of vomiting, sinus pressure, decreased appetite and fever measured 102.7 at its highest. Patient rates back pain an 8/10 currently and states it is aggravated with movement and lifting. Denies cough, sore throat, chest pain, abdominal pain, dysuria, hematuria. Patient also notes she was bitten by a mosquito several days ago prior to onset of symptoms. H/o UTI's.   Past Medical History  Diagnosis Date  . Kidney stones     Past Surgical History  Procedure Date  . Appendectomy   . Dilation and curettage of uterus     No family history on file.  History  Substance Use Topics  . Smoking status: Never Smoker   . Smokeless tobacco: Not on file  . Alcohol Use: No    OB History    Grav Para Term Preterm Abortions TAB SAB Ect Mult Living   1               Review of Systems 10 Systems reviewed and are negative for acute change except as noted in the HPI.  Physical Exam  BP 137/68  Pulse 138  Temp(Src) 100.3 F (37.9 C) (Oral)  Resp 18  Ht 5\' 7"  (1.702 m)  Wt 225 lb (102.059 kg)  BMI 35.24 kg/m2  SpO2 98%  LMP 11/01/2010  Breastfeeding? Unknown  Physical Exam  Nursing note and vitals reviewed. Constitutional: She is oriented to person, place, and time. She appears well-developed and well-nourished. No distress.  HENT:  Head: Normocephalic and atraumatic.  Mouth/Throat: Oropharynx is clear and moist.       Frontal sinus tenderness.   Eyes: EOM are normal.    Neck: Neck supple.       No meningeal signs.   Cardiovascular: Regular rhythm.  Tachycardia present.  Exam reveals no gallop and no friction rub.   No murmur heard.      DP pulses intact.   Pulmonary/Chest: Effort normal and breath sounds normal. She has no wheezes.  Abdominal: Soft. Bowel sounds are normal. She exhibits no distension. There is tenderness (mild) in the suprapubic area. There is no CVA tenderness.  Musculoskeletal: Normal range of motion. She exhibits no edema.       Generalized muscular tenderness. No midline spinal tenderness.   Lymphadenopathy:    She has no cervical adenopathy.  Neurological: She is alert and oriented to person, place, and time. No sensory deficit.  Skin: Skin is warm and dry.  Psychiatric: She has a normal mood and affect. Her behavior is normal.    ED Course  Procedures  1:11PM- Patient informed of lab results and likely cause of back pain and associated symptoms. Informed of intent to d/c home. Patient agrees with plan set forth at this time.   MDM Likely viral syndrome - no neck stiffness.   Lab Results Results for orders placed during the hospital encounter of 11/05/10  URINALYSIS, ROUTINE W REFLEX MICROSCOPIC      Component Value Range  Color, Urine YELLOW  YELLOW    Appearance CLEAR  CLEAR    Specific Gravity, Urine 1.020  1.005 - 1.030    pH 6.0  5.0 - 8.0    Glucose, UA NEGATIVE  NEGATIVE (mg/dL)   Hgb urine dipstick NEGATIVE  NEGATIVE    Bilirubin Urine NEGATIVE  NEGATIVE    Ketones, ur NEGATIVE  NEGATIVE (mg/dL)   Protein, ur NEGATIVE  NEGATIVE (mg/dL)   Urobilinogen, UA 0.2  0.0 - 1.0 (mg/dL)   Nitrite NEGATIVE  NEGATIVE    Leukocytes, UA NEGATIVE  NEGATIVE   PREGNANCY, URINE      Component Value Range   Preg Test, Ur NEGATIVE    POCT PREGNANCY, URINE      Component Value Range   Preg Test, Ur NEGATIVE     Patient states that nausea is gone, pain is improved but still has mild headache and back pain. No neck pain.  Pulse has reduced to 105 and temperature is down to 99.2. Blood pressure is normal. Patient is overall well appearing with a urinalysis showing no signs of infection and she is not pregnant. Likely has a viral syndrome. I will give her precautions for worsening symptoms and indications for return Percocet given prior to discharge for ongoing headache.  I personally performed the services described in this documentation, which was scribed in my presence. The recorded information has been reviewed and considered. Vida Roller, MD    Vida Roller, MD 11/05/10 [phone]

## 2010-11-05 NOTE — ED Notes (Signed)
Patient reports nausea again with dizziness with ambulation to bathroom. EDP made aware, orders given. Patient instructed per EDP that she should not drive home, that she should call for ride. Patient verbalized understanding.

## 2010-11-05 NOTE — ED Notes (Signed)
Patient is resting comfortably. 

## 2010-11-05 NOTE — ED Notes (Signed)
Pt reports lower back pain x 3 or 4 days.  REports nausea with vomiting x 2 today and fever last night.

## 2010-11-09 ENCOUNTER — Encounter (HOSPITAL_COMMUNITY): Payer: Self-pay | Admitting: *Deleted

## 2010-11-09 ENCOUNTER — Emergency Department (HOSPITAL_COMMUNITY)
Admission: EM | Admit: 2010-11-09 | Discharge: 2010-11-09 | Disposition: A | Discharge status: Home / Self Care | Payer: BC Managed Care – PPO | Attending: Emergency Medicine | Admitting: Emergency Medicine

## 2010-11-09 ENCOUNTER — Emergency Department (HOSPITAL_COMMUNITY): Payer: BC Managed Care – PPO

## 2010-11-09 DIAGNOSIS — B349 Viral infection, unspecified: Secondary | ICD-10-CM

## 2010-11-09 DIAGNOSIS — R1110 Vomiting, unspecified: Secondary | ICD-10-CM | POA: Insufficient documentation

## 2010-11-09 DIAGNOSIS — R51 Headache: Secondary | ICD-10-CM | POA: Insufficient documentation

## 2010-11-09 DIAGNOSIS — R509 Fever, unspecified: Secondary | ICD-10-CM | POA: Insufficient documentation

## 2010-11-09 DIAGNOSIS — R197 Diarrhea, unspecified: Secondary | ICD-10-CM | POA: Insufficient documentation

## 2010-11-09 DIAGNOSIS — R112 Nausea with vomiting, unspecified: Secondary | ICD-10-CM | POA: Insufficient documentation

## 2010-11-09 LAB — CBC
MCH: 26.1 pg (ref 26.0–34.0)
Platelets: 254 10*3/uL (ref 150–400)
RBC: 4.82 MIL/uL (ref 3.87–5.11)
RDW: 14 % (ref 11.5–15.5)
WBC: 4.7 10*3/uL (ref 4.0–10.5)

## 2010-11-09 LAB — DIFFERENTIAL
Basophils Absolute: 0 10*3/uL (ref 0.0–0.1)
Eosinophils Absolute: 0 10*3/uL (ref 0.0–0.7)
Eosinophils Relative: 1 % (ref 0–5)
Lymphs Abs: 1.4 10*3/uL (ref 0.7–4.0)
Neutrophils Relative %: 65 % (ref 43–77)

## 2010-11-09 LAB — BASIC METABOLIC PANEL
Calcium: 9.1 mg/dL (ref 8.4–10.5)
GFR calc Af Amer: >60 mL/min (ref >60)
GFR calc non Af Amer: >60 mL/min (ref >60)
Glucose, Bld: 99 mg/dL (ref 70–99)
Potassium: 3.5 mEq/L (ref 3.5–5.1)
Sodium: 140 mEq/L (ref 135–145)

## 2010-11-09 LAB — URINALYSIS, ROUTINE W REFLEX MICROSCOPIC
Hgb urine dipstick: NEGATIVE
Leukocytes, UA: NEGATIVE
Nitrite: NEGATIVE
Protein, ur: NEGATIVE mg/dL
Specific Gravity, Urine: 1.025 (ref 1.005–1.030)
Urobilinogen, UA: 0.2 mg/dL (ref 0.0–1.0)

## 2010-11-09 NOTE — ED Notes (Signed)
Headache and diarrhea since Wednesday. Pt also c/o abdominal pain and nausea. Pt states that she has not been able to eat or drink since Wednesday and feels like she is dehydrated.

## 2010-11-09 NOTE — ED Notes (Signed)
Dr. Zackowski at bedside  

## 2010-11-09 NOTE — ED Provider Notes (Signed)
Scribed for Bridget Jakes, MD, the patient was seen in room APA09/APA09. This chart was scribed by [employer]. The patient's care started at 10:48  CSN: [id number] Arrival date & time: 11/09/2010 10:44 AM  Chief Complaint  Patient presents with  . Headache   HPI [name] is a 35 y.o. female who presents to the Emergency Department complaining of headache, onset Tuesday 11/04/2010. Associated symptoms include fever, diarrhea, facial pressure and vomiting. Patient localized headache to the top of her head. Denies blood in stool or vomit. Reports a change in appetite and denies a history of migraines. PCP is Dr Phillips Odor. There are no other associated symptoms and no other alleviating or aggravating factors.    Past Medical History  Diagnosis Date  . Kidney stones    MEDICATIONS:  Previous Medications   ACETAMINOPHEN (TYLENOL) 500 MG TABLET    Take 1,000 mg by mouth every 6 (six) hours as needed. Headache    HYDROCODONE-ACETAMINOPHEN (VICODIN) 5-500 MG PER TABLET    Take 0.5 tablets by mouth every 6 (six) hours as needed. Back Pain    IBUPROFEN (ADVIL,MOTRIN) 800 MG TABLET    Take 1 tablet (800 mg total) by mouth 3 (three) times daily.   LORATADINE (CLARITIN) 10 MG TABLET    Take 10 mg by mouth daily.     NUTRITIONAL SUPPLEMENTS (WELLNESS ESSENTIALS PO)    Take 1 tablet by mouth daily.     ONDANSETRON (ZOFRAN ODT) 4 MG DISINTEGRATING TABLET    Take 1 tablet (4 mg total) by mouth every 8 (eight) hours as needed for nausea.   OXYCODONE-ACETAMINOPHEN (PERCOCET) 5-325 MG PER TABLET    Take 1 tablet by mouth every 4 (four) hours as needed for pain. May take 2 tablets PO q 6 hours for severe pain - Do not take with Tylenol as this tablet already contains tylenol     ALLERGIES:  Allergies as of 11/09/2010 - Review Complete 11/09/2010  Allergen Reaction Noted  . Promethazine Other (See Comments) 11/05/2010      Past Surgical History  Procedure Date  . Appendectomy   . Dilation and  curettage of uterus     History reviewed. No pertinent family history.  History  Substance Use Topics  . Smoking status: Never Smoker   . Smokeless tobacco: Not on file  . Alcohol Use: No    OB History    Grav Para Term Preterm Abortions TAB SAB Ect Mult Living   1               Review of Systems  Constitutional: Positive for fever.  Gastrointestinal: Positive for nausea, vomiting and diarrhea. Negative for blood in stool.  Neurological: Positive for headaches.  All other systems reviewed and are negative.    Physical Exam  BP 121/63  Pulse 93  Temp(Src) 98.5 F (36.9 C) (Oral)  Resp 16  Ht 5\' 7"  (1.702 m)  Wt 250 lb 1 oz (113.428 kg)  BMI 39.17 kg/m2  SpO2 98%  LMP 11/01/2010  Breastfeeding? No  Physical Exam  Nursing note and vitals reviewed. Constitutional: She is oriented to person, place, and time. She appears well-developed and well-nourished. No distress.  HENT:  Head: Normocephalic and atraumatic.  Mouth/Throat: Oropharynx is clear and moist. No oropharyngeal exudate.  Eyes: Conjunctivae and EOM are normal. Pupils are equal, round, and reactive to light.  Neck: Normal range of motion.  Cardiovascular: Normal rate, regular rhythm and normal heart sounds.   No murmur heard.  Pulmonary/Chest: Effort normal and breath sounds normal. No respiratory distress. She has no wheezes. She has no rales.  Abdominal: Soft. Bowel sounds are normal. She exhibits no distension. There is no tenderness. There is no rebound and no guarding.  Musculoskeletal: Normal range of motion. She exhibits no edema and no tenderness.  Neurological: She is alert and oriented to person, place, and time. No cranial nerve deficit.  Skin: Skin is warm and dry. No rash noted. She is not diaphoretic. No erythema.  Psychiatric: She has a normal mood and affect. Her behavior is normal.     ED Course  Procedures  OTHER DATA REVIEWED: Nursing notes, vital signs, and past medical records  reviewed.    DIAGNOSTIC STUDIES: Oxygen Saturation is 98% on room air, normal by my interpretation.    LABS / RADIOLOGY:  Results for orders placed during the hospital encounter of 11/09/10  CBC      Component Value Range   WBC 4.7  4.0 - 10.5 (K/uL)   RBC 4.82  3.87 - 5.11 (MIL/uL)   Hemoglobin 12.6  12.0 - 15.0 (g/dL)   HCT 56.2  13.0 - 86.5 (%)   MCV 79.7  78.0 - 100.0 (fL)   MCH 26.1  26.0 - 34.0 (pg)   MCHC 32.8  30.0 - 36.0 (g/dL)   RDW 78.4  69.6 - 29.5 (%)   Platelets 254  150 - 400 (K/uL)  DIFFERENTIAL      Component Value Range   Neutrophils Relative 65  43 - 77 (%)   Neutro Abs 3.0  1.7 - 7.7 (K/uL)   Lymphocytes Relative 29  12 - 46 (%)   Lymphs Abs 1.4  0.7 - 4.0 (K/uL)   Monocytes Relative 5  3 - 12 (%)   Monocytes Absolute 0.2  0.1 - 1.0 (K/uL)   Eosinophils Relative 1  0 - 5 (%)   Eosinophils Absolute 0.0  0.0 - 0.7 (K/uL)   Basophils Relative 0  0 - 1 (%)   Basophils Absolute 0.0  0.0 - 0.1 (K/uL)  BASIC METABOLIC PANEL      Component Value Range   Sodium 140  135 - 145 (mEq/L)   Potassium 3.5  3.5 - 5.1 (mEq/L)   Chloride 106  96 - 112 (mEq/L)   CO2 25  19 - 32 (mEq/L)   Glucose, Bld 99  70 - 99 (mg/dL)   BUN 10  6 - 23 (mg/dL)   Creatinine, Ser 2.84  0.50 - 1.10 (mg/dL)   Calcium 9.1  8.4 - 13.2 (mg/dL)   GFR calc non Af Amer >60  >60 (mL/min)   GFR calc Af Amer >60  >60 (mL/min)  URINALYSIS, ROUTINE W REFLEX MICROSCOPIC      Component Value Range   Color, Urine YELLOW  YELLOW    Appearance CLOUDY (*) CLEAR    Specific Gravity, Urine 1.025  1.005 - 1.030    pH 6.5  5.0 - 8.0    Glucose, UA NEGATIVE  NEGATIVE (mg/dL)   Hgb urine dipstick NEGATIVE  NEGATIVE    Bilirubin Urine NEGATIVE  NEGATIVE    Ketones, ur TRACE (*) NEGATIVE (mg/dL)   Protein, ur NEGATIVE  NEGATIVE (mg/dL)   Urobilinogen, UA 0.2  0.0 - 1.0 (mg/dL)   Nitrite NEGATIVE  NEGATIVE    Leukocytes, UA NEGATIVE  NEGATIVE     Ct Head Wo Contrast  11/09/2010  *RADIOLOGY REPORT*   Clinical Data: Headache  CT HEAD WITHOUT CONTRAST  Technique:  Contiguous axial images were obtained from the base of the skull through the vertex without contrast.  Comparison: None.  Findings: No skull fracture is noted.  Paranasal sinuses and mastoid air cells are unremarkable.  No intracranial hemorrhage, mass effect or midline shift.  No hydrocephalus.  No acute infarction.  No mass lesion is noted on this unenhanced scan.  No intra or extra-axial fluid collection.  The gray and white matter differentiation is preserved.  IMPRESSION: No acute intracranial abnormality.  Original Report Authenticated By: Natasha Mead, M.D.    ED COURSE / COORDINATION OF CARE: 11:15 - EDMD examined patient and ordered the following Orders Placed This Encounter  Procedures  . CT Head Wo Contrast  . CBC  . Differential  . Basic metabolic panel  . Urinalysis with microscopic  11:23 - EDMD discussed lab and Radiology results with patient.    MDM:   NOT TOXIC WITH ONSET OF VOMITIGN AND NOW PERSISTENT DIARRHEA SUPECT VIRUS. HEAD CT NEG FOR SINUSITIS. CERTIANLY NOT BACTERIAL MENNIGITIS.   IMPRESSION: Diagnoses that have been ruled out:  Diagnoses that are still under consideration:  Final diagnoses:    PLAN:   The patient is to return the emergency department if there is any worsening of symptoms. I have reviewed the discharge instructions with the PATIENT   CONDITION ON DISCHARGE: STABLE  MEDICATIONS GIVEN IN THE E.D. Medications - No data to display  DISCHARGE MEDICATIONS: New Prescriptions   No medications on file    SCRIBE ATTESTATION:  I personally performed the services described in this documentation, which was scribed in my presence. The recorded information has been reviewed and considered. Bridget Jakes, MD       Bridget Jakes, MD 11/09/10 1200

## 2010-11-10 ENCOUNTER — Emergency Department (HOSPITAL_COMMUNITY): Payer: BC Managed Care – PPO

## 2010-11-10 ENCOUNTER — Emergency Department (HOSPITAL_COMMUNITY)
Admission: EM | Admit: 2010-11-10 | Discharge: 2010-11-10 | Disposition: A | Discharge status: Home / Self Care | Payer: BC Managed Care – PPO | Attending: Emergency Medicine | Admitting: Emergency Medicine

## 2010-11-10 DIAGNOSIS — R51 Headache: Secondary | ICD-10-CM | POA: Insufficient documentation

## 2010-11-10 DIAGNOSIS — F329 Major depressive disorder, single episode, unspecified: Secondary | ICD-10-CM | POA: Insufficient documentation

## 2010-11-10 DIAGNOSIS — F3289 Other specified depressive episodes: Secondary | ICD-10-CM | POA: Insufficient documentation

## 2010-12-06 ENCOUNTER — Emergency Department (HOSPITAL_COMMUNITY)
Admission: EM | Admit: 2010-12-06 | Discharge: 2010-12-06 | Disposition: A | Discharge status: Home / Self Care | Payer: BC Managed Care – PPO | Attending: Emergency Medicine | Admitting: Emergency Medicine

## 2010-12-06 ENCOUNTER — Other Ambulatory Visit: Payer: Self-pay

## 2010-12-06 ENCOUNTER — Encounter (HOSPITAL_COMMUNITY): Payer: Self-pay | Admitting: *Deleted

## 2010-12-06 DIAGNOSIS — Z79899 Other long term (current) drug therapy: Secondary | ICD-10-CM | POA: Insufficient documentation

## 2010-12-06 DIAGNOSIS — R197 Diarrhea, unspecified: Secondary | ICD-10-CM | POA: Insufficient documentation

## 2010-12-06 DIAGNOSIS — R002 Palpitations: Secondary | ICD-10-CM | POA: Insufficient documentation

## 2010-12-06 DIAGNOSIS — R51 Headache: Secondary | ICD-10-CM | POA: Insufficient documentation

## 2010-12-06 DIAGNOSIS — R5381 Other malaise: Secondary | ICD-10-CM | POA: Insufficient documentation

## 2010-12-06 DIAGNOSIS — R079 Chest pain, unspecified: Secondary | ICD-10-CM | POA: Insufficient documentation

## 2010-12-06 DIAGNOSIS — R42 Dizziness and giddiness: Secondary | ICD-10-CM | POA: Insufficient documentation

## 2010-12-06 LAB — CBC
MCV: 81.6 fL (ref 78.0–100.0)
Platelets: 263 10*3/uL (ref 150–400)
RBC: 4.72 MIL/uL (ref 3.87–5.11)
RDW: 14 % (ref 11.5–15.5)
WBC: 5.6 10*3/uL (ref 4.0–10.5)

## 2010-12-06 LAB — BASIC METABOLIC PANEL
CO2: 27 mEq/L (ref 19–32)
Chloride: 103 mEq/L (ref 96–112)
Creatinine, Ser: 0.8 mg/dL (ref 0.50–1.10)
GFR calc Af Amer: >60 mL/min (ref >60)
Sodium: 138 mEq/L (ref 135–145)

## 2010-12-06 LAB — TSH: TSH: 1.197 u[IU]/mL (ref 0.350–4.500)

## 2010-12-06 MED ORDER — HYDROCODONE-ACETAMINOPHEN 5-325 MG PO TABS
1.0000 | ORAL_TABLET | Freq: Once | ORAL | Status: AC
  Administered 2010-12-06: 1 via ORAL
  Filled 2010-12-06: qty 1

## 2010-12-06 MED ORDER — OXYCODONE-ACETAMINOPHEN 5-325 MG PO TABS
1.0000 | ORAL_TABLET | Freq: Once | ORAL | Status: DC
  Filled 2010-12-06: qty 1

## 2010-12-06 NOTE — ED Provider Notes (Signed)
History     CSN: [id number] Arrival date & time: 12/06/2010 11:59 AM  Chief Complaint  Patient presents with  . Chest Pain    (Consider location/radiation/quality/duration/timing/severity/associated sxs/prior treatment) HPI Comments: Pt states that feeling her heart racing/fluttering started just over a week ago.  Almost always present first thing in the morning, associated w/ HA (back of neck around to forehead), diaphoresis, occ dizziness, and occ tingling down left arm, NOT associated w/ chest pain or pressure, N/V, syncope, SOB) and then happens throughout the day, often better at night. Nothing seems to bring episodes on or make them go away, just happen randomly.  Tries to lay down during them but this doesn't seem to change their course. Saw her PCP on Thursday who saw some irregular beat on EKG and thought maybe she heard a murmur and referred pt to cards.  Pt has an appt w/ [facility] this coming Monday (in 2 days) in the afternoon.  Pt reports her mother having h/o hypothyroidism and heart palpitations for which she is on atenolol. Does not think her mother has a fib. Has also had some fatigue and decreased appetite since this started. No N/V, some diarrhea. Of note, pt did give birth 3.5 months ago. Is not breast feeding. No longer taking PNV, just started Vitamin B complex ~2-3 weeks ago b/c she was feeling tired.   Patient is a 35 y.o. female presenting with palpitations. The history is provided by the patient. No language interpreter was used.  Palpitations  This is a new problem. The current episode started more than 1 week ago. The problem occurs daily. The problem has been gradually worsening. Associated symptoms include diaphoresis, malaise/fatigue, headaches and dizziness. Pertinent negatives include no fever, no numbness, no chest pain, no chest pressure, no claudication, no exertional chest pressure, no near-syncope, no orthopnea, no PND, no syncope, no abdominal pain, no nausea, no  vomiting, no back pain, no leg pain, no lower extremity edema, no weakness, no cough and no shortness of breath. She has tried deep relaxation, bed rest and breathing exercises for the symptoms. The treatment provided no relief. Risk factors include obesity and family history. Her past medical history does not include anemia, heart disease, hyperthyroidism or valve disorder.    Past Medical History  Diagnosis Date  . Kidney stones     Past Surgical History  Procedure Date  . Appendectomy   . Dilation and curettage of uterus     History reviewed. No pertinent family history.  History  Substance Use Topics  . Smoking status: Never Smoker   . Smokeless tobacco: Not on file  . Alcohol Use: No    OB History    Grav Para Term Preterm Abortions TAB SAB Ect Mult Living   2 2        2       Review of Systems  Constitutional: Positive for malaise/fatigue, diaphoresis, activity change and appetite change. Negative for fever and chills.  HENT: Negative for ear pain, congestion, rhinorrhea, sneezing, neck pain, neck stiffness and postnasal drip.   Eyes: Negative for visual disturbance.  Respiratory: Negative for apnea, cough, choking, chest tightness and shortness of breath.   Cardiovascular: Positive for palpitations. Negative for chest pain, orthopnea, claudication, leg swelling, syncope, PND and near-syncope.  Gastrointestinal: Positive for diarrhea. Negative for nausea, vomiting, abdominal pain and abdominal distention.  Genitourinary: Negative for dysuria and flank pain.  Musculoskeletal: Negative for myalgias and back pain.  Skin: Negative for pallor and rash.  Neurological:  Positive for dizziness and headaches. Negative for syncope, weakness and numbness.  Hematological: Negative for adenopathy.    Allergies  Percocet and Promethazine  Home Medications   Current Outpatient Rx  Name Route Sig Dispense Refill  . ACETAMINOPHEN 500 MG PO TABS Oral Take 1,000 mg by mouth every 6  (six) hours as needed. Headache     . BC HEADACHE POWDER PO Oral Take 1 Package by mouth 2 (two) times daily as needed. For headaches     . B COMPLEX-C PO TABS Oral Take 1 tablet by mouth daily.      Marland Kitchen HYDROCODONE-ACETAMINOPHEN 5-500 MG PO TABS Oral Take 0.5 tablets by mouth every 6 (six) hours as needed. Back Pain    . LORATADINE 10 MG PO TABS Oral Take 10 mg by mouth daily.      Joan Flores ESSENTIALS PO Oral Take 1 tablet by mouth daily.        BP 101/72  Pulse 85  Resp 18  Ht 5\' 7"  (1.702 m)  Wt 239 lb (108.41 kg)  BMI 37.43 kg/m2  SpO2 99%  LMP 11/26/2010  Breastfeeding? Unknown  Physical Exam  Constitutional: She is oriented to person, place, and time. She appears well-developed and well-nourished. No distress.  HENT:  Head: Normocephalic and atraumatic.  Mouth/Throat: No oropharyngeal exudate.  Eyes: Conjunctivae and EOM are normal. Pupils are equal, round, and reactive to light.  Neck: Normal range of motion. Neck supple. No thyromegaly present.  Cardiovascular: Normal rate, regular rhythm and normal heart sounds.   No murmur heard. Pulmonary/Chest: Effort normal and breath sounds normal. No respiratory distress. She has no wheezes. She exhibits no tenderness.  Abdominal: Soft. Bowel sounds are normal. She exhibits no distension. There is no tenderness.  Musculoskeletal: Normal range of motion. She exhibits no edema and no tenderness.  Lymphadenopathy:    She has no cervical adenopathy.  Neurological: She is alert and oriented to person, place, and time.  Skin: Skin is warm and dry. No rash noted. No pallor.  Psychiatric: She has a normal mood and affect.    ED Course  Procedures (including critical care time)   Labs Reviewed  CBC  BASIC METABOLIC PANEL  TSH   No results found.   No diagnosis found.    MDM  Likely not a life threatening arrythmia. EKG done when pt not having palpitations. No palpitations during my time in the room (>10 minutes). No murmur  appreciated on exam. Ddx includes PACs vs PVCs vs other benign arrythmia vs anemia vs hypothyroidism.  Will check BMET for electrolyte abnormalities, CBC for hemoglobin, TSH for thyroid. Pt already w/ appt w/ [facility] in 2 days, pt advised to keep this appt for possible need for Holter monitoring.   Orthostatic VS WNL, CBC and BMET normal. TSH pending. No obvious organic cause for palpitations. No episodes while in the ED. Pt is safe to f/u w/ [facility] in 2 days as previously scheduled.  Will have PCP f/u TSH.      Date: 12/06/2010  Rate: 75  Rhythm: normal sinus rhythm  QRS Axis: normal  Intervals: normal  ST/T Wave abnormalities: normal  Conduction Disutrbances:none  Narrative Interpretation: normal sinus EKG, no apparent life threatening event, evaluated by Dr. Bebe Shaggy.  Old EKG Reviewed: none available     Angelly Spearing Resident 12/06/10 1401

## 2010-12-06 NOTE — ED Provider Notes (Addendum)
Medical screening examination/treatment/procedure(s) were conducted as a shared visit with resident and myself.  I personally evaluated the patient during the encounter  I have reviewed EKG and agree with findings documented Pt well appearing, no distress, no CP/syncope reported Already has f/u arranged  Joya Gaskins, MD 12/06/10 1225  Joya Gaskins, MD 12/06/10 [phone]

## 2010-12-06 NOTE — ED Notes (Signed)
Pt a/ox4. Resp even and unlabored. NAD at this time. D/C instructions reviewed with pt. Pt verbalized understanding. Pt ambulated with steady gate to POV. 

## 2010-12-06 NOTE — ED Notes (Signed)
Pt refused Percocet. Pt states "It makes me nauseated". Pt did not list Percocet as an intolerance during triage. Dr Bebe Shaggy and Dr Fara Boros notified. New orders received and carried out.

## 2010-12-06 NOTE — ED Notes (Signed)
Pt c/o persistent c/p on left side of chest. Pt states she occasionally has numbness in her left hand and arm. Pt states she was seen by her pcp and told she has something going on with her heart and was referred to cardiologist.

## 2010-12-07 NOTE — ED Provider Notes (Signed)
Medical screening examination/treatment/procedure(s) were conducted as a shared visit with resident and myself.  I personally evaluated the patient during the encounter  I personally reviewed ekg and agree with findings   Joya Gaskins, MD 12/07/10 [phone]

## 2010-12-08 HISTORY — PX: CHOLECYSTECTOMY: SHX55

## 2010-12-20 ENCOUNTER — Emergency Department (HOSPITAL_COMMUNITY): Payer: BC Managed Care – PPO

## 2010-12-20 ENCOUNTER — Emergency Department (HOSPITAL_COMMUNITY)
Admission: EM | Admit: 2010-12-20 | Discharge: 2010-12-20 | Disposition: A | Discharge status: Home / Self Care | Payer: BC Managed Care – PPO | Attending: Emergency Medicine | Admitting: Emergency Medicine

## 2010-12-20 DIAGNOSIS — R002 Palpitations: Secondary | ICD-10-CM | POA: Insufficient documentation

## 2010-12-20 DIAGNOSIS — F3289 Other specified depressive episodes: Secondary | ICD-10-CM | POA: Insufficient documentation

## 2010-12-20 DIAGNOSIS — R42 Dizziness and giddiness: Secondary | ICD-10-CM | POA: Insufficient documentation

## 2010-12-20 DIAGNOSIS — M940 Chondrocostal junction syndrome [Tietze]: Secondary | ICD-10-CM | POA: Insufficient documentation

## 2010-12-20 DIAGNOSIS — Z79899 Other long term (current) drug therapy: Secondary | ICD-10-CM | POA: Insufficient documentation

## 2010-12-20 DIAGNOSIS — F329 Major depressive disorder, single episode, unspecified: Secondary | ICD-10-CM | POA: Insufficient documentation

## 2010-12-20 DIAGNOSIS — R0789 Other chest pain: Secondary | ICD-10-CM | POA: Insufficient documentation

## 2010-12-20 LAB — BASIC METABOLIC PANEL
CO2: 24 mEq/L (ref 19–32)
Chloride: 103 mEq/L (ref 96–112)
GFR calc Af Amer: >90 mL/min (ref >90)
Potassium: 3.6 mEq/L (ref 3.5–5.1)
Sodium: 138 mEq/L (ref 135–145)

## 2010-12-20 LAB — DIFFERENTIAL
Basophils Absolute: 0 10*3/uL (ref 0.0–0.1)
Basophils Relative: 0 % (ref 0–1)
Eosinophils Absolute: 0 10*3/uL (ref 0.0–0.7)
Monocytes Absolute: 0.4 10*3/uL (ref 0.1–1.0)
Monocytes Relative: 6 % (ref 3–12)
Neutro Abs: 4.9 10*3/uL (ref 1.7–7.7)
Neutrophils Relative %: 68 % (ref 43–77)

## 2010-12-20 LAB — CBC
MCV: 79.7 fL (ref 78.0–100.0)
Platelets: 260 10*3/uL (ref 150–400)
RBC: 4.83 MIL/uL (ref 3.87–5.11)
RDW: 13.9 % (ref 11.5–15.5)
WBC: 7.1 10*3/uL (ref 4.0–10.5)

## 2010-12-20 LAB — POCT I-STAT TROPONIN I: Troponin i, poc: 0 ng/mL (ref 0.00–0.08)

## 2010-12-21 ENCOUNTER — Emergency Department (HOSPITAL_COMMUNITY)
Admission: EM | Admit: 2010-12-21 | Discharge: 2010-12-21 | Disposition: A | Discharge status: Home / Self Care | Payer: BC Managed Care – PPO | Attending: Emergency Medicine | Admitting: Emergency Medicine

## 2010-12-21 ENCOUNTER — Other Ambulatory Visit: Payer: Self-pay

## 2010-12-21 ENCOUNTER — Encounter (HOSPITAL_COMMUNITY): Payer: Self-pay | Admitting: *Deleted

## 2010-12-21 DIAGNOSIS — R109 Unspecified abdominal pain: Secondary | ICD-10-CM | POA: Insufficient documentation

## 2010-12-21 DIAGNOSIS — R000 Tachycardia, unspecified: Secondary | ICD-10-CM | POA: Insufficient documentation

## 2010-12-21 DIAGNOSIS — Z87442 Personal history of urinary calculi: Secondary | ICD-10-CM | POA: Insufficient documentation

## 2010-12-21 DIAGNOSIS — R079 Chest pain, unspecified: Secondary | ICD-10-CM | POA: Insufficient documentation

## 2010-12-21 DIAGNOSIS — R61 Generalized hyperhidrosis: Secondary | ICD-10-CM | POA: Insufficient documentation

## 2010-12-21 LAB — TROPONIN I: Troponin I: <0.3 ng/mL (ref <0.30)

## 2010-12-21 NOTE — ED Notes (Signed)
Pt states intermittent, squeezing pain to left chest which began yesterday. Seen at [facility] ER yesterday and dx with costochondritis. Pt states pain continues and now is having pain to left shoulder and neck.

## 2010-12-21 NOTE — ED Provider Notes (Signed)
History  Scribed for Dr. Rubin Payor, the patient was seen in room APA12. The chart was scribed by Gilman Schmidt. The patients care was started at 1815. CSN: [id number] Arrival date & time: 12/21/2010  5:53 PM  Chief Complaint  Patient presents with  . Chest Pain   HPI [name] is a 35 y.o. female who presents to the Emergenc'y Department complaining of intermittent chest pain. Pt was seen at [facility] ED yesterday and dx with costochondritis. Pt states "squeezing" pain continues and is now radiating to left shoulder and neck. Pain is exacerbated by laying down. Additionally notes abdominal pain, palpitations and diaphoresis. Denies any cough. There are no other associated symptoms and no other alleviating or aggravating factors.    Past Medical History  Diagnosis Date  . Kidney stones     Past Surgical History  Procedure Date  . Appendectomy   . Dilation and curettage of uterus     No family history on file.  History  Substance Use Topics  . Smoking status: Never Smoker   . Smokeless tobacco: Not on file  . Alcohol Use: No    OB History    Grav Para Term Preterm Abortions TAB SAB Ect Mult Living   2 2        2       Review of Systems  Constitutional: Positive for diaphoresis.  Respiratory: Negative for cough.   Cardiovascular: Positive for chest pain and palpitations.  Gastrointestinal: Positive for abdominal pain.  Musculoskeletal:       Neck Pain Shoulder Pain     Allergies  Percocet and Promethazine  Home Medications   Current Outpatient Rx  Name Route Sig Dispense Refill  . ACETAMINOPHEN 500 MG PO TABS Oral Take 1,000 mg by mouth every 6 (six) hours as needed. Headache     . BC HEADACHE POWDER PO Oral Take 1 Package by mouth 2 (two) times daily as needed. For headaches     . B COMPLEX-C PO TABS Oral Take 1 tablet by mouth daily.      Marland Kitchen HYDROCODONE-ACETAMINOPHEN 5-500 MG PO TABS Oral Take 0.5 tablets by mouth every 6 (six) hours as needed. Back Pain    .  LORATADINE 10 MG PO TABS Oral Take 10 mg by mouth daily.      Joan Flores ESSENTIALS PO Oral Take 1 tablet by mouth daily.        BP 120/80  Pulse 95  Temp(Src) 98.5 F (36.9 C) (Oral)  Resp 20  Ht 5\' 7"  (1.702 m)  Wt 240 lb (108.863 kg)  BMI 37.59 kg/m2  SpO2 97%  LMP 11/26/2010  Breastfeeding? Unknown  Physical Exam  Constitutional: She is oriented to person, place, and time. She appears well-developed and well-nourished.  Non-toxic appearance. She does not have a sickly appearance.  HENT:  Head: Normocephalic and atraumatic.  Eyes: Conjunctivae, EOM and lids are normal. Pupils are equal, round, and reactive to light. No scleral icterus.  Neck: Trachea normal and normal range of motion. Neck supple.  Cardiovascular: Normal rate, regular rhythm and normal heart sounds.   Pulmonary/Chest: Effort normal and breath sounds normal.       Left anterior chest wall tenderness  Abdominal: Soft. Normal appearance and bowel sounds are normal. She exhibits no mass. There is no tenderness. There is no rebound, no guarding and no CVA tenderness.  Musculoskeletal: Normal range of motion.  Neurological: She is alert and oriented to person, place, and time. She has normal strength.  Skin: Skin is warm, dry and intact. No rash noted.    ED Course  Procedures  DIAGNOSTIC STUDIES:    Date: 12/21/2010  Rate: 83  Rhythm: normal sinus rhythm  QRS Axis: normal  Intervals: normal  ST/T Wave abnormalities: normal  Conduction Disutrbances:none  Narrative Interpretation:   Old EKG Reviewed: unchanged   COORDINATION OF CARE: 1815:  - Patient evaluated by ED physician, Troponin, EKG ordered  Dg Chest 2 View  12/20/2010  *RADIOLOGY REPORT*  Clinical Data: Chest pain and tightness, palpitations, dizziness  CHEST - 2 VIEW  Comparison: None  Findings: Upper-normal size of cardiac silhouette. Mediastinal contours and pulmonary vascularity normal. Lungs clear. No pleural effusion or pneumothorax.  Bones unremarkable.  IMPRESSION: No acute abnormalities.  Original Report Authenticated By: Lollie Marrow, M.D.     MDM  Patient has had left-sided chest pain since yesterday but for a few days before that to. She was seen at [facility] yesterday for the same thing. She states it is worse with moving or laying back. No cough. No dyspnea. No fevers. No recent travel. I doubt cardiac cause of this. I doubt abdominal cause of this it does not appear to be PE. Patient has cardiology followup. She will be discharged.   I personally performed the services described in this documentation, which was scribed in my presence. The recorded information has been reviewed and considered. Juliet Rude. Rubin Payor, MD      Juliet Rude. Rubin Payor, MD 12/21/10 1946

## 2010-12-22 ENCOUNTER — Emergency Department (HOSPITAL_COMMUNITY): Payer: BC Managed Care – PPO

## 2010-12-22 ENCOUNTER — Emergency Department (HOSPITAL_COMMUNITY)
Admission: EM | Admit: 2010-12-22 | Discharge: 2010-12-22 | Disposition: A | Discharge status: Home / Self Care | Payer: BC Managed Care – PPO | Attending: Emergency Medicine | Admitting: Emergency Medicine

## 2010-12-22 DIAGNOSIS — F329 Major depressive disorder, single episode, unspecified: Secondary | ICD-10-CM | POA: Insufficient documentation

## 2010-12-22 DIAGNOSIS — R109 Unspecified abdominal pain: Secondary | ICD-10-CM | POA: Insufficient documentation

## 2010-12-22 DIAGNOSIS — K8020 Calculus of gallbladder without cholecystitis without obstruction: Secondary | ICD-10-CM | POA: Insufficient documentation

## 2010-12-22 DIAGNOSIS — R079 Chest pain, unspecified: Secondary | ICD-10-CM | POA: Insufficient documentation

## 2010-12-22 DIAGNOSIS — N390 Urinary tract infection, site not specified: Secondary | ICD-10-CM | POA: Insufficient documentation

## 2010-12-22 DIAGNOSIS — F3289 Other specified depressive episodes: Secondary | ICD-10-CM | POA: Insufficient documentation

## 2010-12-22 LAB — URINALYSIS, ROUTINE W REFLEX MICROSCOPIC
Glucose, UA: NEGATIVE mg/dL
Nitrite: NEGATIVE
Specific Gravity, Urine: 1.024 (ref 1.005–1.030)
pH: 6.5 (ref 5.0–8.0)

## 2010-12-22 LAB — CBC
Hemoglobin: 13 g/dL (ref 12.0–15.0)
MCH: 26.7 pg (ref 26.0–34.0)
MCHC: 33.2 g/dL (ref 30.0–36.0)
Platelets: 262 10*3/uL (ref 150–400)
RBC: 4.87 MIL/uL (ref 3.87–5.11)

## 2010-12-22 LAB — COMPREHENSIVE METABOLIC PANEL
AST: 14 U/L (ref 0–37)
Albumin: 4.1 g/dL (ref 3.5–5.2)
BUN: 10 mg/dL (ref 6–23)
Chloride: 105 mEq/L (ref 96–112)
Creatinine, Ser: 0.77 mg/dL (ref 0.50–1.10)
Potassium: 4.3 mEq/L (ref 3.5–5.1)
Total Protein: 7.2 g/dL (ref 6.0–8.3)

## 2010-12-22 LAB — D-DIMER, QUANTITATIVE: D-Dimer, Quant: 0.33 ug/mL-FEU (ref 0.00–0.48)

## 2010-12-22 LAB — POCT I-STAT TROPONIN I: Troponin i, poc: 0 ng/mL (ref 0.00–0.08)

## 2010-12-22 LAB — DIFFERENTIAL
Basophils Absolute: 0 10*3/uL (ref 0.0–0.1)
Basophils Relative: 0 % (ref 0–1)
Eosinophils Absolute: 0 10*3/uL (ref 0.0–0.7)
Monocytes Relative: 5 % (ref 3–12)
Neutro Abs: 4.8 10*3/uL (ref 1.7–7.7)
Neutrophils Relative %: 72 % (ref 43–77)

## 2010-12-22 LAB — URINE MICROSCOPIC-ADD ON

## 2010-12-22 LAB — LIPASE, BLOOD: Lipase: 16 U/L (ref 11–59)

## 2010-12-22 LAB — POCT PREGNANCY, URINE: Preg Test, Ur: NEGATIVE

## 2010-12-23 LAB — URINE CULTURE

## 2010-12-24 ENCOUNTER — Inpatient Hospital Stay (HOSPITAL_COMMUNITY)
Admission: AD | Admit: 2010-12-24 | Discharge: 2010-12-27 | DRG: 494 | Disposition: A | Discharge status: Home / Self Care | Payer: BC Managed Care – PPO | Source: Ambulatory Visit | Attending: Surgery | Admitting: Surgery

## 2010-12-24 ENCOUNTER — Encounter (INDEPENDENT_AMBULATORY_CARE_PROVIDER_SITE_OTHER): Payer: Self-pay | Admitting: Surgery

## 2010-12-24 ENCOUNTER — Telehealth (INDEPENDENT_AMBULATORY_CARE_PROVIDER_SITE_OTHER): Payer: Self-pay

## 2010-12-24 ENCOUNTER — Inpatient Hospital Stay (HOSPITAL_COMMUNITY): Payer: BC Managed Care – PPO

## 2010-12-24 ENCOUNTER — Ambulatory Visit (INDEPENDENT_AMBULATORY_CARE_PROVIDER_SITE_OTHER): Payer: BC Managed Care – PPO | Admitting: Surgery

## 2010-12-24 VITALS — BP 144/94 | HR 72 | Temp 97.8°F | Resp 16 | Ht 67.0 in | Wt 240.2 lb

## 2010-12-24 DIAGNOSIS — K8010 Calculus of gallbladder with chronic cholecystitis without obstruction: Secondary | ICD-10-CM

## 2010-12-24 DIAGNOSIS — N200 Calculus of kidney: Secondary | ICD-10-CM | POA: Diagnosis present

## 2010-12-24 DIAGNOSIS — R6883 Chills (without fever): Secondary | ICD-10-CM | POA: Insufficient documentation

## 2010-12-24 DIAGNOSIS — R1011 Right upper quadrant pain: Secondary | ICD-10-CM

## 2010-12-24 DIAGNOSIS — Z87442 Personal history of urinary calculi: Secondary | ICD-10-CM

## 2010-12-24 LAB — COMPREHENSIVE METABOLIC PANEL
ALT: 27 U/L (ref 0–35)
Albumin: 3.4 g/dL — ABNORMAL LOW (ref 3.5–5.2)
Calcium: 8.6 mg/dL (ref 8.4–10.5)
GFR calc Af Amer: >90 mL/min (ref >90)
Glucose, Bld: 80 mg/dL (ref 70–99)
Sodium: 137 mEq/L (ref 135–145)
Total Protein: 6.4 g/dL (ref 6.0–8.3)

## 2010-12-24 LAB — URINE MICROSCOPIC-ADD ON

## 2010-12-24 LAB — CBC
Hemoglobin: 12.2 g/dL (ref 12.0–15.0)
MCH: 26.5 pg (ref 26.0–34.0)
MCHC: 32.6 g/dL (ref 30.0–36.0)
RDW: 13.9 % (ref 11.5–15.5)

## 2010-12-24 LAB — URINALYSIS, ROUTINE W REFLEX MICROSCOPIC
Bilirubin Urine: NEGATIVE
Leukocytes, UA: NEGATIVE
Nitrite: NEGATIVE
Specific Gravity, Urine: 1.028 (ref 1.005–1.030)
Urobilinogen, UA: 1 mg/dL (ref 0.0–1.0)
pH: 6.5 (ref 5.0–8.0)

## 2010-12-24 NOTE — Telephone Encounter (Signed)
Patient called in and said she talked to someone this morning and was given an urgent office appointment this afternoon. She was wondering if she had anything sooner because she was in a lot of pain. I told her no, that was the soonest open appointment. She wondered if she should go to hospital. I told her she should if she felt her pain got worse or to where she couldn't bare it. She is keeping her appointment for now.

## 2010-12-24 NOTE — Progress Notes (Signed)
Subjective:     Patient ID: [name], female   DOB: [birth year], 35 y.o.   MRN: [id number]  HPI  Right upper quadrant abdominal pain nausea and vomiting. Please see dictated history and physical on the Hospital system Review of Systems     Objective:   Physical Exam    Please see history and physical on the Hospital system from admission history and physical Assessment:     Right upper quadrant abdominal pain nausea and vomiting    Plan:     Patient is being admitted to the hospital.

## 2010-12-25 ENCOUNTER — Other Ambulatory Visit (INDEPENDENT_AMBULATORY_CARE_PROVIDER_SITE_OTHER): Payer: Self-pay | Admitting: Surgery

## 2010-12-25 ENCOUNTER — Inpatient Hospital Stay (HOSPITAL_COMMUNITY): Payer: BC Managed Care – PPO

## 2010-12-26 LAB — URINALYSIS, ROUTINE W REFLEX MICROSCOPIC
Bilirubin Urine: NEGATIVE
Glucose, UA: NEGATIVE mg/dL
Ketones, ur: NEGATIVE mg/dL
Nitrite: NEGATIVE
Protein, ur: NEGATIVE mg/dL
Specific Gravity, Urine: 1.006 (ref 1.005–1.030)

## 2010-12-26 LAB — URINE MICROSCOPIC-ADD ON

## 2010-12-27 LAB — COMPREHENSIVE METABOLIC PANEL
ALT: 60 U/L — ABNORMAL HIGH (ref 0–35)
Alkaline Phosphatase: 87 U/L (ref 39–117)
CO2: 29 mEq/L (ref 19–32)
GFR calc Af Amer: >90 mL/min (ref >90)
GFR calc non Af Amer: 88 mL/min — ABNORMAL LOW (ref >90)
Glucose, Bld: 89 mg/dL (ref 70–99)
Potassium: 3.2 mEq/L — ABNORMAL LOW (ref 3.5–5.1)
Sodium: 139 mEq/L (ref 135–145)
Total Bilirubin: 0.4 mg/dL (ref 0.3–1.2)

## 2010-12-27 LAB — CBC
Hemoglobin: 11.2 g/dL — ABNORMAL LOW (ref 12.0–15.0)
RBC: 4.22 MIL/uL (ref 3.87–5.11)

## 2010-12-27 NOTE — Op Note (Signed)
[name], [name] NO.:  [id number]  MEDICAL RECORD NO.:  [id number]  LOCATION:  1528                         FACILITY:  [facility]  PHYSICIAN:  Wilmon Arms. Corliss Skains, M.D. DATE OF BIRTH:  08-Jun-1975  DATE OF PROCEDURE:  12/25/2010 DATE OF DISCHARGE:                              OPERATIVE REPORT   PREOPERATIVE DIAGNOSIS:  Cholecystitis.  POSTOP DIAGNOSIS:  Cholecystitis.  PROCEDURE:  Laparoscopic cholecystectomy with intraoperative cholangiogram.  SURGEON:  Wilmon Arms. Treasure Ingrum, M.D.  ANESTHESIA:  General.  INDICATIONS:  This is a 35 year old female who presents 4 months' postpartum with several days of severe right upper quadrant abdominal pain, nausea, and vomiting.  She was found to have a small gallstone on ultrasound, but no sign of cholecystitis.  She also has kidney stones, but this pain feels much different than her typical kidney stone pain. She presents now for cholecystectomy.  DESCRIPTION OF PROCEDURE:  The patient was brought to the operating room, placed in the supine position on the operating room table.  After an adequate level of general anesthesia was obtained, the patient's abdomen was prepped with ChloraPrep and draped in sterile fashion.  Time- out was taken to ensure the proper patient and proper procedure.  We infiltrated with infiltrated the area below the umbilicus with 0.25% Marcaine with epinephrine.  We made a transverse incision through her old scar.  Dissection was carried down the fascia.  We opened the fascia vertically.  We entered the peritoneal cavity bluntly.  A stay suture of 0-Vicryl was placed around the fascial opening.  The Hasson cannula was inserted secured to stay suture.  Pneumoperitoneum was obtained by insufflating CO2 maintaining maximal pressure of 15 mmHg.  The laparoscope was inserted and the patient was positioned in reverse Trendelenburg tilted to her left.  An 11 mm port was placed in the subxiphoid position.   Two 5 mm ports placed in the right upper quadrant. The gallbladder was fairly distended, but had minimal thickening and inflammation.  We opened the peritoneum around the hilum of gallbladder. We bluntly dissected around the cystic duct and cystic artery.  The cystic duct was ligated with a clip distally.  The cystic artery was ligated with 3 clips.  A small opening was created on the cystic duct. A Cook cholangiogram catheter was inserted through a stab incision and threaded in the cystic duct.  A cholangiogram was then obtained, which showed good flow posturing distally in biliary tree with no sign of filling defect.  Contrast flowed easily into the duodenum.  The catheter was removed and the cystic duct was ligated clips and divided.  The cystic artery was divided.  The gallbladder was then dissected free from the liver.  The gallbladder was placed in an EndoCatch sac.  We thoroughly cauterized the gallbladder fossa for hemostasis.  We packed Surgicel snow into the gallbladder fossa.  We then removed the gallbladder EndoCatch sac through the umbilical port site.  We closed the umbilical fascia with the purse-string suture.  We then irrigated and suctioned dry the right upper quadrant.  Hemostasis was good. Pneumoperitoneum was then released as removed the trocars.  4-0 Monocryl was used to close  skin incisions.  Steri- Strips and clean dressings were applied.  The patient was extubated and brought to recovery room in stable condition.  All sponge, instrument, and needle counts correct.     Wilmon Arms. Corliss Skains, M.D.     MKT/MEDQ  D:  12/25/2010  T:  12/26/2010  Job:  [id number]  Electronically Signed by Manus Rudd M.D. on 12/27/2010 03:29:41 PM

## 2010-12-29 ENCOUNTER — Ambulatory Visit (INDEPENDENT_AMBULATORY_CARE_PROVIDER_SITE_OTHER): Payer: BC Managed Care – PPO | Admitting: General Surgery

## 2010-12-31 ENCOUNTER — Emergency Department (HOSPITAL_COMMUNITY): Payer: BC Managed Care – PPO

## 2010-12-31 ENCOUNTER — Emergency Department (HOSPITAL_COMMUNITY)
Admission: EM | Admit: 2010-12-31 | Discharge: 2010-12-31 | Disposition: A | Discharge status: Home / Self Care | Payer: BC Managed Care – PPO | Attending: Emergency Medicine | Admitting: Emergency Medicine

## 2010-12-31 DIAGNOSIS — M7989 Other specified soft tissue disorders: Secondary | ICD-10-CM | POA: Insufficient documentation

## 2010-12-31 DIAGNOSIS — M79609 Pain in unspecified limb: Secondary | ICD-10-CM | POA: Insufficient documentation

## 2010-12-31 DIAGNOSIS — F329 Major depressive disorder, single episode, unspecified: Secondary | ICD-10-CM | POA: Insufficient documentation

## 2010-12-31 DIAGNOSIS — R0609 Other forms of dyspnea: Secondary | ICD-10-CM | POA: Insufficient documentation

## 2010-12-31 DIAGNOSIS — E669 Obesity, unspecified: Secondary | ICD-10-CM | POA: Insufficient documentation

## 2010-12-31 DIAGNOSIS — F411 Generalized anxiety disorder: Secondary | ICD-10-CM | POA: Insufficient documentation

## 2010-12-31 DIAGNOSIS — F3289 Other specified depressive episodes: Secondary | ICD-10-CM | POA: Insufficient documentation

## 2010-12-31 DIAGNOSIS — R0989 Other specified symptoms and signs involving the circulatory and respiratory systems: Secondary | ICD-10-CM | POA: Insufficient documentation

## 2010-12-31 LAB — POCT I-STAT, CHEM 8
Glucose, Bld: 83 mg/dL (ref 70–99)
HCT: 42 % (ref 36.0–46.0)
Hemoglobin: 14.3 g/dL (ref 12.0–15.0)
Potassium: 3.9 mEq/L (ref 3.5–5.1)
Sodium: 138 mEq/L (ref 135–145)

## 2010-12-31 LAB — CBC
HCT: 40.2 % (ref 36.0–46.0)
MCHC: 32.6 g/dL (ref 30.0–36.0)
RDW: 13.8 % (ref 11.5–15.5)

## 2010-12-31 LAB — DIFFERENTIAL
Basophils Absolute: 0 10*3/uL (ref 0.0–0.1)
Basophils Relative: 0 % (ref 0–1)
Eosinophils Relative: 1 % (ref 0–5)
Monocytes Absolute: 0.5 10*3/uL (ref 0.1–1.0)

## 2010-12-31 MED ORDER — IOHEXOL 300 MG/ML  SOLN
100.0000 mL | Freq: Once | INTRAMUSCULAR | Status: AC | PRN
  Administered 2010-12-31: 100 mL via INTRAVENOUS

## 2011-01-01 ENCOUNTER — Telehealth (INDEPENDENT_AMBULATORY_CARE_PROVIDER_SITE_OTHER): Payer: Self-pay | Admitting: General Surgery

## 2011-01-01 NOTE — H&P (Signed)
[name], [name] NO.:  [id number]  MEDICAL RECORD NO.:  [id number]  LOCATION:  1528                         FACILITY:  [facility]  PHYSICIAN:  [name], M.D. DATE OF BIRTH:  04/08/1975  DATE OF ADMISSION:  12/24/2010 DATE OF DISCHARGE:                             HISTORY & PHYSICAL   CHIEF COMPLAINT:  Right upper quadrant abdominal pain, nausea, and vomiting.  HISTORY:  This is a 35 year old female referred by the emergency department.  She has been having a several-month history of pain in her chest that now goes to her right upper quadrant and back.  She has had nausea and vomiting with this.  It appears worse with fatty meals. Today, it has even gone to the left upper quadrant.  She has been in and out of emergency department several times and has not been admitted. She has also found recently to have urinary tract infection.  She reports some fevers and occasional chills.  Her bowel movements have been normal.  PAST MEDICAL HISTORY:  Significant for history of kidney stones.  She has also had an appendectomy and dilation and curettage.  MEDICATIONS:  Please see universal medical reconciliation form.  She is on no chronic medications.  FAMILY HISTORY:  Significant for hypertension.  SOCIALLY:  She does not smoke and does not drink alcohol.  REVIEW OF SYSTEMS:  Listed as above, otherwise is unremarkable.  She has no problems from a cardiopulmonary standpoint.  PHYSICAL EXAMINATION:  GENERAL:  This is an ill-appearing female in obvious discomfort. VITAL SIGNS:  Temperature 97.8, pulse 72, respiratory rate 16, blood pressure is 144/94. EYES:  Anicteric.  Pupils reactive bilaterally. ENT:  External ears and nose normal.  Hearing is normal.  Oropharynx is clear. NECK:  Supple.  Trachea is midline.  No thyromegaly. LUNGS:  Clear to auscultation bilaterally with normal respiratory effort. CARDIOVASCULAR:  Regular rate and rhythm.  There are no  murmurs.  No peripheral edema. ABDOMEN:  Soft.  There is tenderness with guarding in the right upper quadrant.  The rest of the abdomen is soft and nontender.  There are no hernias.  There is no organomegaly.  EXTREMITIES:  Warm and well perfused.  No edema, clubbing, or cyanosis.  Peripheral pulses are intact in all 4 extremities. SKIN:  Shows no rashes.  No jaundice.  DATA REVIEW:  The patient's ultrasound from October 15th shows a possible 8 mm gallbladder stone.  There is no gallbladder wall thickening.  The bile ducts are normal.  There is no pericholecystic fluid.  I have her laboratory data, which shows she has a normal white blood count.  Normal liver function tests and normal creatinine and normal lipase.  Her urinalysis showed a large amount of hemoglobin, small amount of bilirubin, negative nitrates, positive leukocyte esterase, 7-10 white blood cells, too numerous to count red blood cells, and many bacteria.  She does report she was on her period during this collection.  IMPRESSION:  This is a patient with right upper quadrant pain, nausea, and vomiting.  I suspect this may be biliary in origin, although given her urinalysis it is difficult to tell all of her symptoms do  seem consistent with gallbladder.  At this point, she is going to be admitted to the hospital.  We will check another urinalysis, repeat her laboratory data.  I will also get a CAT scan noncontrasted of the abdomen and pelvis to rule out  nephrolithiasis.  She then may need either a laparoscopic cholecystectomy versus HIDA scan if the diagnosis remains in doubt.     [name], M.D.     DB/MEDQ  D:  12/24/2010  T:  12/24/2010  Job:  [id number]  Electronically Signed by [name] M.D. on 01/01/2011 12:35:51 PM

## 2011-01-01 NOTE — Telephone Encounter (Signed)
MS Gluth CALLED TO SAY SHE HAD SWOLLEN "LYMPH NODES UNDER ARM AND ACROSS CHEST" PER PT'S MOTHER. SHE HAD EXPERIENCED CHEST PAIN YESTERDAY AND WENT TO EMERGENCY ROOM. CT ANGIOGRAM WAS DONE AND LAB WORK. SHE ALSO STATED SHE FEELS WEAK, WITH SOME NAUSEA. THE PAIN SHE EXPERIENCED BEFORE SURGERY WAS BETTER AFTER GALLBLADDER WAS REMOVED. SHE CALLED TO SEE IF SHE COULD BE SEEN SOON. I REVIEWED SCAN AND LABS WITH DR. D. NEWMAN AND HE RECOMMENDED SHE BE SEEN IN URGENT OFFICE TOMORROW. PT NOTIFIED OF APPT WITH DR. Elveria Rising TOMORROW. IF SHE FEELS WORSE AGAIN SHE WILL GO TO ER.

## 2011-01-02 ENCOUNTER — Other Ambulatory Visit: Payer: Self-pay | Admitting: Family Medicine

## 2011-01-02 ENCOUNTER — Other Ambulatory Visit (HOSPITAL_COMMUNITY): Payer: Self-pay | Admitting: Family Medicine

## 2011-01-02 ENCOUNTER — Encounter (INDEPENDENT_AMBULATORY_CARE_PROVIDER_SITE_OTHER): Payer: BC Managed Care – PPO | Admitting: General Surgery

## 2011-01-02 DIAGNOSIS — N644 Mastodynia: Secondary | ICD-10-CM

## 2011-01-04 ENCOUNTER — Emergency Department (HOSPITAL_COMMUNITY): Payer: BC Managed Care – PPO

## 2011-01-04 ENCOUNTER — Emergency Department (HOSPITAL_COMMUNITY)
Admission: EM | Admit: 2011-01-04 | Discharge: 2011-01-04 | Disposition: A | Discharge status: Home / Self Care | Payer: BC Managed Care – PPO | Attending: Emergency Medicine | Admitting: Emergency Medicine

## 2011-01-04 DIAGNOSIS — Y836 Removal of other organ (partial) (total) as the cause of abnormal reaction of the patient, or of later complication, without mention of misadventure at the time of the procedure: Secondary | ICD-10-CM | POA: Insufficient documentation

## 2011-01-04 DIAGNOSIS — T8140XA Infection following a procedure, unspecified, initial encounter: Secondary | ICD-10-CM | POA: Insufficient documentation

## 2011-01-04 DIAGNOSIS — IMO0001 Reserved for inherently not codable concepts without codable children: Secondary | ICD-10-CM | POA: Insufficient documentation

## 2011-01-04 DIAGNOSIS — R079 Chest pain, unspecified: Secondary | ICD-10-CM | POA: Insufficient documentation

## 2011-01-04 LAB — DIFFERENTIAL
Basophils Absolute: 0 10*3/uL (ref 0.0–0.1)
Basophils Relative: 0 % (ref 0–1)
Eosinophils Absolute: 0.1 10*3/uL (ref 0.0–0.7)
Eosinophils Relative: 2 % (ref 0–5)
Lymphocytes Relative: 28 % (ref 12–46)
Lymphs Abs: 1.6 10*3/uL (ref 0.7–4.0)
Monocytes Absolute: 0.4 10*3/uL (ref 0.1–1.0)
Monocytes Relative: 7 % (ref 3–12)
Neutro Abs: 3.5 10*3/uL (ref 1.7–7.7)
Neutrophils Relative %: 62 % (ref 43–77)

## 2011-01-04 LAB — CBC
Platelets: 267 10*3/uL (ref 150–400)
RBC: 4.95 MIL/uL (ref 3.87–5.11)
WBC: 5.6 10*3/uL (ref 4.0–10.5)

## 2011-01-04 LAB — POCT I-STAT, CHEM 8
BUN: 8 mg/dL (ref 6–23)
Calcium, Ion: 1.19 mmol/L (ref 1.12–1.32)
Chloride: 102 mEq/L (ref 96–112)
Creatinine, Ser: 0.9 mg/dL (ref 0.50–1.10)
Glucose, Bld: 83 mg/dL (ref 70–99)
HCT: 43 % (ref 36.0–46.0)
Hemoglobin: 14.6 g/dL (ref 12.0–15.0)
Potassium: 3.4 mEq/L — ABNORMAL LOW (ref 3.5–5.1)
Sodium: 139 mEq/L (ref 135–145)
TCO2: 24 mmol/L (ref 0–100)

## 2011-01-06 ENCOUNTER — Inpatient Hospital Stay: Admission: RE | Admit: 2011-01-06 | Payer: BC Managed Care – PPO | Source: Ambulatory Visit

## 2011-01-06 ENCOUNTER — Encounter (INDEPENDENT_AMBULATORY_CARE_PROVIDER_SITE_OTHER): Payer: Self-pay | Admitting: General Surgery

## 2011-01-06 ENCOUNTER — Ambulatory Visit (INDEPENDENT_AMBULATORY_CARE_PROVIDER_SITE_OTHER): Payer: BC Managed Care – PPO | Admitting: General Surgery

## 2011-01-06 ENCOUNTER — Ambulatory Visit
Admission: RE | Admit: 2011-01-06 | Discharge: 2011-01-06 | Disposition: A | Discharge status: Home / Self Care | Payer: BC Managed Care – PPO | Source: Ambulatory Visit | Attending: General Surgery | Admitting: General Surgery

## 2011-01-06 VITALS — BP 136/84 | HR 72 | Temp 98.3°F | Resp 18 | Ht 67.0 in | Wt 235.6 lb

## 2011-01-06 DIAGNOSIS — Z9889 Other specified postprocedural states: Secondary | ICD-10-CM

## 2011-01-06 DIAGNOSIS — G8918 Other acute postprocedural pain: Secondary | ICD-10-CM

## 2011-01-06 DIAGNOSIS — K8010 Calculus of gallbladder with chronic cholecystitis without obstruction: Secondary | ICD-10-CM

## 2011-01-06 LAB — COMPREHENSIVE METABOLIC PANEL
AST: 12 U/L (ref 0–37)
Albumin: 4.5 g/dL (ref 3.5–5.2)
BUN: 9 mg/dL (ref 6–23)
Calcium: 9.5 mg/dL (ref 8.4–10.5)
Chloride: 101 mEq/L (ref 96–112)
Glucose, Bld: 78 mg/dL (ref 70–99)
Potassium: 3.7 mEq/L (ref 3.5–5.3)
Sodium: 134 mEq/L — ABNORMAL LOW (ref 135–145)
Total Protein: 6.8 g/dL (ref 6.0–8.3)

## 2011-01-06 LAB — CBC WITH DIFFERENTIAL/PLATELET
HCT: 39.6 % (ref 36.0–46.0)
Hemoglobin: 13.2 g/dL (ref 12.0–15.0)
Lymphocytes Relative: 34 % (ref 12–46)
Monocytes Absolute: 0.4 10*3/uL (ref 0.1–1.0)
Monocytes Relative: 7 % (ref 3–12)
Neutro Abs: 3.2 10*3/uL (ref 1.7–7.7)
RBC: 5 MIL/uL (ref 3.87–5.11)
WBC: 5.9 10*3/uL (ref 4.0–10.5)

## 2011-01-06 MED ORDER — IOHEXOL 300 MG/ML  SOLN
100.0000 mL | Freq: Once | INTRAMUSCULAR | Status: AC | PRN
  Administered 2011-01-06: 100 mL via INTRAVENOUS

## 2011-01-06 NOTE — Progress Notes (Signed)
[name] 06/26/1975 [id number] 01/06/2011   [name] is a 35 y.o. female who had a laparoscopic cholecystectomy with intraoperative cholangiogram.  The pathology report confirmed chronic cholecystitis and cholelithiasis.  The patient reports she has not been feeling well over the last several days.  She is having a decrease in her appetite and some nausea.  She complains of mid chest pain.  She complains that her lymph nodes are swollen and she has generalized malaise.  She states her bowel movements are mostly diarrhea and "bright yellow" in color.  She denies any fevers, but admits to chills.  She was seen by her PCP who recommended follow-up with the surgeons.  She saw the ED secondary to these complaints.  Her labs were normal along with an abdominal and chest x-ray.  Physical examination - Incisions appear well-healed except her umbilical incision that has slightly opened with a minimal amount of serous drainage.  Her abdomen is mildly tender diffusely, EXCEPT in her right upper quadrant.  He has somewhat hypoactive bowel sounds, ND.  No peritoneal signs or hernias  Impression:  s/p laparoscopic cholecystectomy with IOC 2. Post-op pain  Plan:  We will send her for CMET, CBC with diff, and a CT of her abdomen/Pelvis to rule out any post operative complications.  If this is all negative, I recommended to the patient she return to her PCP for further work-up as her symptoms are not common with post-op complications.  We will call her with her results once they have come in.  At this point, she will not need follow-up if all is negative; however, if she has any complications further plans will be made after her diagnostic tests.

## 2011-01-06 NOTE — Patient Instructions (Signed)
We will call you tomorrow with lab and CT scan results

## 2011-01-07 ENCOUNTER — Telehealth (INDEPENDENT_AMBULATORY_CARE_PROVIDER_SITE_OTHER): Payer: Self-pay

## 2011-01-07 NOTE — Telephone Encounter (Signed)
Called patient with lab and Ct results. Told her they were normal and to follow up with PCP.

## 2011-01-09 ENCOUNTER — Ambulatory Visit
Admission: RE | Admit: 2011-01-09 | Discharge: 2011-01-09 | Disposition: A | Discharge status: Home / Self Care | Payer: BC Managed Care – PPO | Source: Ambulatory Visit | Attending: Family Medicine | Admitting: Family Medicine

## 2011-01-09 ENCOUNTER — Other Ambulatory Visit: Payer: BC Managed Care – PPO

## 2011-01-09 DIAGNOSIS — N644 Mastodynia: Secondary | ICD-10-CM

## 2011-01-13 NOTE — Discharge Summary (Signed)
  NAMECYRSTAL, [name] NO.:  [id number]  MEDICAL RECORD NO.:  [id number]  LOCATION:  1528                         FACILITY:  [facility]  PHYSICIAN:  Wilmon Arms. Corliss Skains, M.D. DATE OF BIRTH:  Oct 08, 1975  DATE OF ADMISSION:  12/24/2010 DATE OF DISCHARGE:  12/27/2010                              DISCHARGE SUMMARY   ADMISSION DIAGNOSIS:  Cholecystitis.  DISCHARGE DIAGNOSIS:  Cholecystitis.  PROCEDURE:  Laparoscopic cholecystectomy with intraoperative cholangiogram, December 25, 2010, Dr. Manus Rudd.  BRIEF HISTORY:  The patient is a 35 year old female who was postpartum 7 days of severe right upper quadrant pain, nausea, and vomiting.  She was found to have small gallstone on ultrasound, but no signs of cholecystitis.  She also has kidney stones, but this pain feels much different than her typical kidney stone.  She was evaluated after admission by Dr. Magnus Ivan and Dr. Lynann Bologna saw her on the morning of December 25, 2010 and offered cholecystectomy.  The risks and benefits were discussed.  Informed consent was obtained.  She was taken to the OR on December 25, 2010,  and underwent laparoscopic cholecystectomy.  She tolerated the procedure well.  She was mobilized over the next 24 hours and was subsequently discharged on December 27, 2010.  At this point, incisions were healing well.  She was eating a regular diet making good progress.  DISCHARGE MEDICATIONS: 1. She was given hydrocodone APAP 5/325 p.r.n. for pain. 2. Ibuprofen 600 mg every 6 hours p.r.n. for pain. 3. Aspirin 81 mg daily. 4. Claritin 1 tablet daily. 5. Tylenol p.r.n. for pain. 6. Xanax 0.25 mg as needed.  FOLLOWUP:  She will follow up in 2 weeks in our office.  CONDITION ON DISCHARGE:  Improved.     Eber Hong, P.A.   ______________________________ Wilmon Arms. Fynn Adel, M.D.    WDJ/MEDQ  D:  01/08/2011  T:  01/08/2011  Job:  [id number]  Electronically Signed by Sherrie George P.A. on  01/09/2011 06:51:24 PM Electronically Signed by Manus Rudd M.D. on 01/13/2011 09:40:54 PM

## 2011-01-14 ENCOUNTER — Other Ambulatory Visit: Payer: BC Managed Care – PPO

## 2011-01-28 ENCOUNTER — Encounter (HOSPITAL_COMMUNITY): Payer: BC Managed Care – PPO

## 2011-02-08 ENCOUNTER — Emergency Department (HOSPITAL_COMMUNITY): Payer: BC Managed Care – PPO

## 2011-02-08 ENCOUNTER — Encounter (HOSPITAL_COMMUNITY): Payer: Self-pay | Admitting: *Deleted

## 2011-02-08 ENCOUNTER — Emergency Department (HOSPITAL_COMMUNITY)
Admission: EM | Admit: 2011-02-08 | Discharge: 2011-02-08 | Disposition: A | Discharge status: Home / Self Care | Payer: BC Managed Care – PPO | Attending: Emergency Medicine | Admitting: Emergency Medicine

## 2011-02-08 DIAGNOSIS — Z87442 Personal history of urinary calculi: Secondary | ICD-10-CM | POA: Insufficient documentation

## 2011-02-08 DIAGNOSIS — J40 Bronchitis, not specified as acute or chronic: Secondary | ICD-10-CM | POA: Insufficient documentation

## 2011-02-08 LAB — BASIC METABOLIC PANEL
BUN: 10 mg/dL (ref 6–23)
CO2: 27 mEq/L (ref 19–32)
Calcium: 9.2 mg/dL (ref 8.4–10.5)
Glucose, Bld: 97 mg/dL (ref 70–99)
Potassium: 3.7 mEq/L (ref 3.5–5.1)
Sodium: 137 mEq/L (ref 135–145)

## 2011-02-08 LAB — CBC
HCT: 38.1 % (ref 36.0–46.0)
Hemoglobin: 12.6 g/dL (ref 12.0–15.0)
MCH: 27 pg (ref 26.0–34.0)
MCHC: 33.1 g/dL (ref 30.0–36.0)
RBC: 4.66 MIL/uL (ref 3.87–5.11)

## 2011-02-08 MED ORDER — PREDNISONE 10 MG PO TABS: ORAL_TABLET | ORAL | Status: DC

## 2011-02-08 MED ORDER — HYDROCOD POLST-CHLORPHEN POLST 10-8 MG/5ML PO LQCR: 5.0000 mL | Freq: Two times a day (BID) | ORAL | Status: DC | PRN
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
## 2011-02-09 ENCOUNTER — Emergency Department (HOSPITAL_COMMUNITY)
Admission: EM | Admit: 2011-02-09 | Discharge: 2011-02-09 | Disposition: A | Discharge status: Home / Self Care | Payer: BC Managed Care – PPO | Attending: Emergency Medicine | Admitting: Emergency Medicine

## 2011-02-09 ENCOUNTER — Emergency Department (HOSPITAL_COMMUNITY): Payer: BC Managed Care – PPO

## 2011-02-09 ENCOUNTER — Other Ambulatory Visit: Payer: Self-pay

## 2011-02-09 ENCOUNTER — Encounter (HOSPITAL_COMMUNITY): Payer: Self-pay | Admitting: *Deleted

## 2011-02-09 DIAGNOSIS — R109 Unspecified abdominal pain: Secondary | ICD-10-CM | POA: Insufficient documentation

## 2011-02-09 DIAGNOSIS — R51 Headache: Secondary | ICD-10-CM | POA: Insufficient documentation

## 2011-02-09 DIAGNOSIS — R42 Dizziness and giddiness: Secondary | ICD-10-CM | POA: Insufficient documentation

## 2011-02-09 DIAGNOSIS — IMO0001 Reserved for inherently not codable concepts without codable children: Secondary | ICD-10-CM | POA: Insufficient documentation

## 2011-02-09 DIAGNOSIS — R05 Cough: Secondary | ICD-10-CM

## 2011-02-09 DIAGNOSIS — R112 Nausea with vomiting, unspecified: Secondary | ICD-10-CM | POA: Insufficient documentation

## 2011-02-09 DIAGNOSIS — R059 Cough, unspecified: Secondary | ICD-10-CM | POA: Insufficient documentation

## 2011-02-09 DIAGNOSIS — R509 Fever, unspecified: Secondary | ICD-10-CM | POA: Insufficient documentation

## 2011-02-09 DIAGNOSIS — R079 Chest pain, unspecified: Secondary | ICD-10-CM | POA: Insufficient documentation

## 2011-02-09 DIAGNOSIS — Z87442 Personal history of urinary calculi: Secondary | ICD-10-CM | POA: Insufficient documentation

## 2011-02-09 LAB — COMPREHENSIVE METABOLIC PANEL
Alkaline Phosphatase: 85 U/L (ref 39–117)
BUN: 9 mg/dL (ref 6–23)
Chloride: 103 mEq/L (ref 96–112)
Creatinine, Ser: 0.92 mg/dL (ref 0.50–1.10)
GFR calc Af Amer: >90 mL/min (ref >90)
Glucose, Bld: 99 mg/dL (ref 70–99)
Potassium: 3.4 mEq/L — ABNORMAL LOW (ref 3.5–5.1)
Total Bilirubin: 0.2 mg/dL — ABNORMAL LOW (ref 0.3–1.2)

## 2011-02-09 LAB — URINALYSIS, ROUTINE W REFLEX MICROSCOPIC
Bilirubin Urine: NEGATIVE
Leukocytes, UA: NEGATIVE
Nitrite: NEGATIVE
Specific Gravity, Urine: 1.025 (ref 1.005–1.030)
Urobilinogen, UA: 0.2 mg/dL (ref 0.0–1.0)

## 2011-02-09 LAB — RAPID STREP SCREEN (MED CTR MEBANE ONLY): Streptococcus, Group A Screen (Direct): NEGATIVE

## 2011-02-09 LAB — DIFFERENTIAL
Basophils Absolute: 0 10*3/uL (ref 0.0–0.1)
Basophils Relative: 0 % (ref 0–1)
Neutro Abs: 3.3 10*3/uL (ref 1.7–7.7)
Neutrophils Relative %: 81 % — ABNORMAL HIGH (ref 43–77)

## 2011-02-09 LAB — URINE MICROSCOPIC-ADD ON

## 2011-02-09 LAB — CBC
MCHC: 33.3 g/dL (ref 30.0–36.0)
Platelets: 217 10*3/uL (ref 150–400)
RDW: 13.9 % (ref 11.5–15.5)
WBC: 4.1 10*3/uL (ref 4.0–10.5)

## 2011-02-09 LAB — LIPASE, BLOOD: Lipase: 12 U/L (ref 11–59)

## 2011-02-09 LAB — POCT I-STAT TROPONIN I: Troponin i, poc: 0 ng/mL (ref 0.00–0.08)

## 2011-02-09 MED ORDER — ONDANSETRON HCL 4 MG PO TABS: 4.0000 mg | ORAL_TABLET | Freq: Four times a day (QID) | ORAL | Status: AC | PRN

## 2011-02-09 MED ORDER — SODIUM CHLORIDE 0.9 % IV SOLN
INTRAVENOUS | Status: DC
  Administered 2011-02-09: 13:00:00 via INTRAVENOUS

## 2011-02-09 MED ORDER — ONDANSETRON HCL 4 MG/2ML IJ SOLN
4.0000 mg | INTRAMUSCULAR | Status: DC | PRN
  Administered 2011-02-09: 4 mg via INTRAVENOUS
  Filled 2011-02-09: qty 2

## 2011-02-09 MED ORDER — POTASSIUM CHLORIDE 20 MEQ PO PACK
40.0000 meq | PACK | Freq: Once | ORAL | Status: AC
  Administered 2011-02-09: 40 meq via ORAL
  Filled 2011-02-09: qty 2

## 2011-02-09 MED ORDER — SODIUM CHLORIDE 0.9 % IV BOLUS (SEPSIS)
1000.0000 mL | Freq: Once | INTRAVENOUS | Status: AC
  Administered 2011-02-09: 1000 mL via INTRAVENOUS

## 2011-02-09 MED ORDER — KETOROLAC TROMETHAMINE 30 MG/ML IJ SOLN
30.0000 mg | Freq: Once | INTRAMUSCULAR | Status: AC
  Administered 2011-02-09: 30 mg via INTRAVENOUS
  Filled 2011-02-09: qty 1

## 2011-02-09 NOTE — ED Notes (Signed)
Patient transported to X-ray 

## 2011-02-09 NOTE — ED Provider Notes (Signed)
History     CSN: [id number] Arrival date & time: 02/09/2011 12:38 PM   Chief Complaint  Patient presents with  . ?syncope/ vomiting/ fever    HPI Pt was seen at 1325.  Per pt, c/o gradual onset and persistence of constant generalized body aches/fatigue, cough, subjective home fevers/chills, vague generalized abd "pain," as well as multiple episodes of N/V since yesterday.  States she has also felt lightheaded and "thinks I might have passed out a little bit" because she "feels so weak" from vomiting. Denies diarrhea, no CP/SOB, no back pain, no black or blood in stools or emesis, no rash.      Past Medical History  Diagnosis Date  . Kidney stones   . Chills   . Night sweats   . Fever   . Abdominal pain   . Nausea & vomiting   . Rectal bleeding   . Dizziness   . Weakness   . Syncope   . Generalized headaches   . Sinus problem   . Poor appetite     Past Surgical History  Procedure Date  . Appendectomy   . Dilation and curettage of uterus   . Cholecystectomy     Family History  Problem Relation Age of Onset  . Hypertension Mother   . Hypertension Father     History  Substance Use Topics  . Smoking status: Never Smoker   . Smokeless tobacco: Never Used  . Alcohol Use: No    OB History    Grav Para Term Preterm Abortions TAB SAB Ect Mult Living   2 2        2       Review of Systems ROS: Statement: All systems negative except as marked or noted in the HPI; Constitutional: +subjective home fevers/chills. +generalized body aches/fatigue; ; Eyes: Negative for eye pain, redness and discharge. ; ; ENMT: Negative for ear pain, hoarseness, nasal congestion, sinus pressure and sore throat. ; ; Cardiovascular: Negative for chest pain, palpitations, diaphoresis, dyspnea and peripheral edema. ; ; Respiratory: +cough.  Negative for wheezing and stridor. ; ; Gastrointestinal: +N/V.  Negative for diarrhea and abdominal pain, blood in stool, hematemesis, jaundice and rectal  bleeding. . ; ; Genitourinary: Negative for dysuria, flank pain and hematuria. ; ; Musculoskeletal: Negative for back pain and neck pain. Negative for swelling and trauma.; ; Skin: Negative for pruritus, rash, abrasions, blisters, bruising and skin lesion.; ; Neuro: +lightheadedness.  Negative for headache, and neck stiffness. Negative for altered level of consciousness , altered mental status, extremity weakness, paresthesias, involuntary movement, seizure.     Allergies  Percocet; Promethazine; and Phenergan  Home Medications   Current Outpatient Rx  Name Route Sig Dispense Refill  . ACETAMINOPHEN 500 MG PO TABS Oral Take 1,000 mg by mouth every 6 (six) hours as needed. Headache     . HYDROCOD POLST-CHLORPHEN POLST 10-8 MG/5ML PO LQCR Oral Take 5 mLs by mouth every 12 (twelve) hours as needed. 120 mL 0  . LORATADINE 10 MG PO TABS Oral Take 10 mg by mouth daily.     Marygrace Drought WOMENS FORMULA PO Oral Take 1 tablet by mouth daily.      Marland Kitchen PREDNISONE 10 MG PO TABS  5,4,3,2,1 - take with food 15 tablet 0  . ASPIRIN EC 81 MG PO TBEC Oral Take 81 mg by mouth as needed. For wellness    . LORAZEPAM 0.5 MG PO TABS Oral Take 0.5 mg by mouth every 8 (eight) hours as needed.  For anxiety       BP 113/80  Pulse 113  Temp(Src) 99.2 F (37.3 C) (Oral)  Resp 18  Ht 5\' 7"  (1.702 m)  Wt 230 lb (104.327 kg)  BMI 36.02 kg/m2  SpO2 97%  LMP 02/09/2011  Physical Exam 1330: Physical examination:  Nursing notes reviewed; Vital signs and O2 SAT reviewed;  Constitutional: Well developed, Well nourished, Well hydrated, In no acute distress; Head:  Normocephalic, atraumatic; Eyes: EOMI, PERRL, No scleral icterus; ENMT: +clear fluid levels behind TM's bilat.  +edemetous nasal turbinates bilat with clear rhinorrhea. Mouth and pharynx normal, Mucous membranes moist; Neck: Supple, Full range of motion, No lymphadenopathy; Cardiovascular: Regular rate and rhythm, No murmur, rub, or gallop; Respiratory: Breath sounds  clear & equal bilaterally, No rales, rhonchi, wheezes, or rub, Normal respiratory effort/excursion; Chest: Nontender, Movement normal; Abdomen: Soft, Nontender, Nondistended, Normal bowel sounds; Extremities: Pulses normal, No tenderness, No edema, No calf edema or asymmetry.; Neuro: AA&Ox3, Major CN grossly intact. No facial droop, speech clear.  No gross focal motor or sensory deficits in extremities.; Skin: Color normal, Warm, Dry, no rash, no petechiae.    ED Course  Procedures   MDM  MDM Reviewed: nursing note and vitals Reviewed previous: ECG Interpretation: ECG, labs and x-ray    Date: 02/09/2011  Rate: 99  Rhythm: normal sinus rhythm  QRS Axis: normal  Intervals: normal  ST/T Wave abnormalities: normal  Conduction Disutrbances:none  Narrative Interpretation:   Old EKG Reviewed: unchanged; no significant changes from previous EKG dated 01/04/2011.  Results for orders placed during the hospital encounter of 02/09/11  COMPREHENSIVE METABOLIC PANEL      Component Value Range   Sodium 138  135 - 145 (mEq/L)   Potassium 3.4 (*) 3.5 - 5.1 (mEq/L)   Chloride 103  96 - 112 (mEq/L)   CO2 26  19 - 32 (mEq/L)   Glucose, Bld 99  70 - 99 (mg/dL)   BUN 9  6 - 23 (mg/dL)   Creatinine, Ser 1.61  0.50 - 1.10 (mg/dL)   Calcium 9.5  8.4 - 09.6 (mg/dL)   Total Protein 7.4  6.0 - 8.3 (g/dL)   Albumin 3.8  3.5 - 5.2 (g/dL)   AST 14  0 - 37 (U/L)   ALT 19  0 - 35 (U/L)   Alkaline Phosphatase 85  39 - 117 (U/L)   Total Bilirubin 0.2 (*) 0.3 - 1.2 (mg/dL)   GFR calc non Af Amer 80 (*) >90 (mL/min)   GFR calc Af Amer >90  >90 (mL/min)  LIPASE, BLOOD      Component Value Range   Lipase 12  11 - 59 (U/L)  CBC      Component Value Range   WBC 4.1  4.0 - 10.5 (K/uL)   RBC 4.90  3.87 - 5.11 (MIL/uL)   Hemoglobin 13.3  12.0 - 15.0 (g/dL)   HCT 04.5  40.9 - 81.1 (%)   MCV 81.6  78.0 - 100.0 (fL)   MCH 27.1  26.0 - 34.0 (pg)   MCHC 33.3  30.0 - 36.0 (g/dL)   RDW 91.4  78.2 - 95.6 (%)    Platelets 217  150 - 400 (K/uL)  DIFFERENTIAL      Component Value Range   Neutrophils Relative 81 (*) 43 - 77 (%)   Neutro Abs 3.3  1.7 - 7.7 (K/uL)   Lymphocytes Relative 12  12 - 46 (%)   Lymphs Abs 0.5 (*) 0.7 - 4.0 (K/uL)  Monocytes Relative 7  3 - 12 (%)   Monocytes Absolute 0.3  0.1 - 1.0 (K/uL)   Eosinophils Relative 0  0 - 5 (%)   Eosinophils Absolute 0.0  0.0 - 0.7 (K/uL)   Basophils Relative 0  0 - 1 (%)   Basophils Absolute 0.0  0.0 - 0.1 (K/uL)  URINALYSIS, ROUTINE W REFLEX MICROSCOPIC      Component Value Range   Color, Urine YELLOW  YELLOW    APPearance CLEAR  CLEAR    Specific Gravity, Urine 1.025  1.005 - 1.030    pH 6.5  5.0 - 8.0    Glucose, UA NEGATIVE  NEGATIVE (mg/dL)   Hgb urine dipstick MODERATE (*) NEGATIVE    Bilirubin Urine NEGATIVE  NEGATIVE    Ketones, ur NEGATIVE  NEGATIVE (mg/dL)   Protein, ur NEGATIVE  NEGATIVE (mg/dL)   Urobilinogen, UA 0.2  0.0 - 1.0 (mg/dL)   Nitrite NEGATIVE  NEGATIVE    Leukocytes, UA NEGATIVE  NEGATIVE   RAPID STREP SCREEN      Component Value Range   Streptococcus, Group A Screen (Direct) NEGATIVE  NEGATIVE   POCT I-STAT TROPONIN I      Component Value Range   Troponin i, poc 0.00  0.00 - 0.08 (ng/mL)   Comment 3           POCT PREGNANCY, URINE      Component Value Range   Preg Test, Ur NEGATIVE    URINE MICROSCOPIC-ADD ON      Component Value Range   Squamous Epithelial / LPF FEW (*) RARE    WBC, UA 3-6  <3 (WBC/hpf)   RBC / HPF 0-2  <3 (RBC/hpf)   Bacteria, UA MANY (*) RARE     Ct Head Wo Contrast  02/09/2011  *RADIOLOGY REPORT*  Clinical Data: Syncope.  Headache.  Dizziness.  CT HEAD WITHOUT CONTRAST  Technique:  Contiguous axial images were obtained from the base of the skull through the vertex without contrast.  Comparison: 11/10/2010  Findings: The brain has a normal appearance without evidence of malformation, atrophy, old or acute infarction, mass lesion, hemorrhage, hydrocephalus or extra-axial collection.   The calvarium is unremarkable.  Sinuses, middle ears and mastoids are clear.  IMPRESSION: Normal examination.  Note that this is the third normal head CT in 3 months.  Original Report Authenticated By: Thomasenia Sales, M.D.   Dg Abd Acute W/chest  02/09/2011  *RADIOLOGY REPORT*  Clinical Data: Chest pain.  Abdominal pain.  Fever.  Rule out small bowel obstruction or free intraperitoneal air.  ACUTE ABDOMEN SERIES (ABDOMEN 2 VIEW & CHEST 1 VIEW)  Comparison: CT of 01/06/2011.  Plain film chest of 02/08/2011.  Findings: Frontal view of the chest demonstrates midline trachea. Normal heart size and mediastinal contours. No pleural effusion or pneumothorax.  Clear lungs.  Abominal films demonstrate no free intraperitoneal air or significant air fluid levels.  Minimal S-shaped thoracolumbar spine curvature.  Cholecystectomy clips.  No bowel distention. No abnormal abdominal calcifications.   No appendicolith.  Distal gas.  IMPRESSION: No acute findings.  Original Report Authenticated By: Consuello Bossier, M.D.     5:14 PM:  Has not vomited while in ED.  Has tol PO well.  Not orthostatic.  Appears viral illness at this time.  Pt denies dysuria, UC pending.  Potassium repleted PO.  Dx testing d/w pt.  Questions answered.  Verb understanding, agreeable to d/c home with outpt f/u.  Emara Lichter Allison Quarry, DO 02/11/11 [phone]

## 2011-02-09 NOTE — ED Notes (Signed)
Pt states she was seen here yesterday and dx with bronchitis. Vomiting began after visit yesterday at 1730. Also states fever and possible syncopal episode. Pt states " I fell, may have passed out from throwing up so much" Pt states unknown LOC.

## 2011-02-09 NOTE — ED Provider Notes (Signed)
History     CSN: [id number] Arrival date & time: 02/08/2011  9:08 AM   First MD Initiated Contact with Patient 02/08/11 1111      Chief Complaint  Patient presents with  . Cough    (Consider location/radiation/quality/duration/timing/severity/associated sxs/prior treatment) Patient is a 35 y.o. female presenting with cough. The history is provided by the patient.  Cough This is a new problem. The current episode started yesterday. The problem occurs constantly. The problem has been gradually worsening. The cough is non-productive. There has been no fever. Associated symptoms include rhinorrhea, sore throat and shortness of breath. Pertinent negatives include no chest pain, no chills and no wheezing. Treatments tried: acetaminophen. The treatment provided no relief. She is not a smoker. Her past medical history is significant for bronchitis. Past medical history comments: sinusitis.    Past Medical History  Diagnosis Date  . Kidney stones   . Chills   . Night sweats   . Fever   . Abdominal pain   . Nausea & vomiting   . Rectal bleeding   . Dizziness   . Weakness   . Syncope   . Generalized headaches   . Sinus problem   . Poor appetite     Past Surgical History  Procedure Date  . Appendectomy   . Dilation and curettage of uterus   . Cholecystectomy     Family History  Problem Relation Age of Onset  . Hypertension Mother   . Hypertension Father     History  Substance Use Topics  . Smoking status: Never Smoker   . Smokeless tobacco: Never Used  . Alcohol Use: No    OB History    Grav Para Term Preterm Abortions TAB SAB Ect Mult Living   2 2        2       Review of Systems  Constitutional: Negative for chills and activity change.       All ROS Neg except as noted in HPI  HENT: Positive for sore throat and rhinorrhea. Negative for nosebleeds and neck pain.   Eyes: Negative for photophobia and discharge.  Respiratory: Positive for cough and shortness of  breath. Negative for wheezing.   Cardiovascular: Negative for chest pain and palpitations.  Gastrointestinal: Negative for abdominal pain and blood in stool.  Genitourinary: Negative for dysuria, frequency and hematuria.  Musculoskeletal: Negative for back pain and arthralgias.  Skin: Negative.   Neurological: Negative for dizziness, seizures and speech difficulty.  Psychiatric/Behavioral: Negative for hallucinations and confusion.    Allergies  Percocet; Promethazine; and Phenergan  Home Medications   Current Outpatient Rx  Name Route Sig Dispense Refill  . ACETAMINOPHEN 500 MG PO TABS Oral Take 1,000 mg by mouth every 6 (six) hours as needed. Headache     . ASPIRIN EC 81 MG PO TBEC Oral Take 81 mg by mouth as needed. For wellness    . LORATADINE 10 MG PO TABS Oral Take 10 mg by mouth daily.     Marland Kitchen LORAZEPAM 0.5 MG PO TABS Oral Take 0.5 mg by mouth every 8 (eight) hours as needed. For anxiety     . ONE-A-DAY WOMENS FORMULA PO Oral Take 1 tablet by mouth daily.      Marland Kitchen HYDROCOD POLST-CHLORPHEN POLST 10-8 MG/5ML PO LQCR Oral Take 5 mLs by mouth every 12 (twelve) hours as needed. 120 mL 0  . PREDNISONE 10 MG PO TABS  5,4,3,2,1 - take with food 15 tablet 0    BP  134/64  Pulse 102  Temp(Src) 99.1 F (37.3 C) (Oral)  Resp 20  Ht 5\' 7"  (1.702 m)  Wt 230 lb (104.327 kg)  BMI 36.02 kg/m2  SpO2 99%  LMP 01/15/2011  Physical Exam  Nursing note and vitals reviewed. Constitutional: She is oriented to person, place, and time. She appears well-developed and well-nourished.  Non-toxic appearance.  HENT:  Head: Normocephalic.  Right Ear: Tympanic membrane and external ear normal.  Left Ear: Tympanic membrane and external ear normal.  Eyes: EOM and lids are normal. Pupils are equal, round, and reactive to light.  Neck: Normal range of motion. Neck supple. Carotid bruit is not present.  Cardiovascular: Normal rate, regular rhythm, normal heart sounds, intact distal pulses and normal pulses.    Pulmonary/Chest: Breath sounds normal. No respiratory distress. She has no wheezes.       Few scattered rhonchi  Abdominal: Soft. Bowel sounds are normal. There is no tenderness. There is no guarding.  Musculoskeletal: Normal range of motion.  Lymphadenopathy:       Head (right side): No submandibular adenopathy present.       Head (left side): No submandibular adenopathy present.    She has no cervical adenopathy.  Neurological: She is alert and oriented to person, place, and time. She has normal strength. No cranial nerve deficit or sensory deficit.  Skin: Skin is warm and dry.  Psychiatric: She has a normal mood and affect. Her speech is normal.    ED Course  Procedures (including critical care time)  Labs Reviewed  BASIC METABOLIC PANEL - Abnormal; Notable for the following:    GFR calc non Af Amer 88 (*)    All other components within normal limits  CBC  LAB REPORT - SCANNED   Dg Chest 2 View  02/08/2011  *RADIOLOGY REPORT*  Clinical Data: Cough, chills, congestion.  Chest pain.  CHEST - 2 VIEW  Comparison: 12/15/2010  Findings: Heart and mediastinal contours are within normal limits. No focal opacities or effusions.  No acute bony abnormality.  IMPRESSION: No active cardiopulmonary disease.  Original Report Authenticated By: Cyndie Chime, M.D.   Ct Head Wo Contrast  02/09/2011  *RADIOLOGY REPORT*  Clinical Data: Syncope.  Headache.  Dizziness.  CT HEAD WITHOUT CONTRAST  Technique:  Contiguous axial images were obtained from the base of the skull through the vertex without contrast.  Comparison: 11/10/2010  Findings: The brain has a normal appearance without evidence of malformation, atrophy, old or acute infarction, mass lesion, hemorrhage, hydrocephalus or extra-axial collection.  The calvarium is unremarkable.  Sinuses, middle ears and mastoids are clear.  IMPRESSION: Normal examination.  Note that this is the third normal head CT in 3 months.  Original Report Authenticated  By: Thomasenia Sales, M.D.   Dg Abd Acute W/chest  02/09/2011  *RADIOLOGY REPORT*  Clinical Data: Chest pain.  Abdominal pain.  Fever.  Rule out small bowel obstruction or free intraperitoneal air.  ACUTE ABDOMEN SERIES (ABDOMEN 2 VIEW & CHEST 1 VIEW)  Comparison: CT of 01/06/2011.  Plain film chest of 02/08/2011.  Findings: Frontal view of the chest demonstrates midline trachea. Normal heart size and mediastinal contours. No pleural effusion or pneumothorax.  Clear lungs.  Abominal films demonstrate no free intraperitoneal air or significant air fluid levels.  Minimal S-shaped thoracolumbar spine curvature.  Cholecystectomy clips.  No bowel distention. No abnormal abdominal calcifications.   No appendicolith.  Distal gas.  IMPRESSION: No acute findings.  Original Report Authenticated By: Karn Cassis.  TALBOT, M.D.     1. Bronchitis       MDM  Pulse ox 98% on RA. WNL. Chest xray nonacute. Pt speaks in complete sentences. Color good. No fever in ED. Explained to pt. She may have had irritation from the [place] last night, but no acute problem at this time. Safe to go home.        Kathie Dike, [place] 02/09/11 [phone]

## 2011-02-09 NOTE — ED Notes (Signed)
Pt reports being seen here on yesterday and was diagnosed bronchitis. Pt c/o of body aches. Pt states that she has abdominal pain from vomiting all day yesterday. Pt reports nausea and vomiting at this time. Breath sounds coarse in the bases and clear through out. Positive bowel sounds in all quadrants, abdomen soft and non distended. Pt denies any genitourinary complaints at this time.

## 2011-02-10 LAB — URINE CULTURE: Culture: NO GROWTH

## 2011-02-10 NOTE — ED Provider Notes (Signed)
Medical screening examination/treatment/procedure(s) were performed by non-physician practitioner and as supervising physician I was immediately available for consultation/collaboration.  Flint Melter, MD 02/10/11 [phone]

## 2011-02-16 ENCOUNTER — Encounter: Payer: Self-pay | Admitting: Gastroenterology

## 2011-02-16 ENCOUNTER — Ambulatory Visit (INDEPENDENT_AMBULATORY_CARE_PROVIDER_SITE_OTHER): Payer: BC Managed Care – PPO | Admitting: Gastroenterology

## 2011-02-16 ENCOUNTER — Encounter (HOSPITAL_COMMUNITY): Payer: Self-pay | Admitting: Pharmacy Technician

## 2011-02-16 VITALS — BP 100/70 | HR 72 | Temp 97.6°F | Ht 67.0 in | Wt 232.6 lb

## 2011-02-16 DIAGNOSIS — K625 Hemorrhage of anus and rectum: Secondary | ICD-10-CM

## 2011-02-16 DIAGNOSIS — R197 Diarrhea, unspecified: Secondary | ICD-10-CM

## 2011-02-16 DIAGNOSIS — R7401 Elevation of levels of liver transaminase levels: Secondary | ICD-10-CM | POA: Insufficient documentation

## 2011-02-16 DIAGNOSIS — R1032 Left lower quadrant pain: Secondary | ICD-10-CM | POA: Insufficient documentation

## 2011-02-16 DIAGNOSIS — R112 Nausea with vomiting, unspecified: Secondary | ICD-10-CM | POA: Insufficient documentation

## 2011-02-16 LAB — FERRITIN: Ferritin: 29 ng/mL (ref 10–291)

## 2011-02-16 MED ORDER — DICYCLOMINE HCL 10 MG PO CAPS: 10.0000 mg | ORAL_CAPSULE | ORAL | Status: DC | PRN

## 2011-02-16 MED ORDER — DEXLANSOPRAZOLE 60 MG PO CPDR: 60.0000 mg | DELAYED_RELEASE_CAPSULE | Freq: Every day | ORAL | Status: AC

## 2011-02-16 NOTE — Assessment & Plan Note (Signed)
Intermittently elevated transaminases. Present in 2009 as well. Maternal aunt with h/o UC/PSC. ?fatty liver given obesity but liver okay on u/s and CT. Check viral markers, lfts, iron, ferritin, ceruloplasmin.

## 2011-02-16 NOTE — Assessment & Plan Note (Signed)
Acute on chronic diarrhea. Not mentioned above, mother recently diagnosed with C.Diff although she has not had contact with the patient. Symptoms worse over the past couple of weeks. Patient reports negative C. difficile done through the [facility] ER last night. O+P ordered but results not available. Patient reports significant abd pain, rectal bleeding. Needs colonoscopy with terminal ileoscopy. Maternal aunt h/o UC required colectomy.  I have discussed the risks, alternatives, benefits with regards to but not limited to the risk of reaction to medication, bleeding, infection, perforation and the patient is agreeable to proceed. Written consent to be obtained.  Will recheck CDiff and stool culture. Patient had negative celiac panel in 2009, consider small bowel biopsy.

## 2011-02-16 NOTE — Patient Instructions (Signed)
Please begin Dexilant one before breakfast daily. If you are not able to eat, take it with liquid. Have your lab work done. We have scheduled you for a colonoscopy and egd with Dr. Jena Gauss. See separate instructions. Please try to drink at least one tablespoon of liquid every 15 minutes if you are actively vomiting, This will help maintain hydration. Stick to bland diet right now.

## 2011-02-16 NOTE — Progress Notes (Addendum)
Primary Care Physician:  Colette Ribas, MD  Primary Gastroenterologist:  Roetta Sessions, MD   Chief Complaint  Patient presents with  . Abdominal Pain  . Nausea  . Diarrhea    HPI:  [name] is a 35 y.o. female here for further evaluation of abdominal pain, nausea, vomiting, diarrhea. We last saw the patient in 2009 when she presented with complaints of chronic diarrhea, lower abdominal pain, left upper quadrant pain, mildly elevated transaminases. He scheduled her for colonoscopy at that time but she ended up not having this done stating that she started having improvement in her symptoms. She had negative celiac disease antibody panel in 2009.  She states over the last several years she had done reasonably well. She gave birth to her second child back in June 2012. Her pregnancy was overall uneventful. She reports having to go to ED multiple times prior to October for right upper quadrant abdominal pain vomiting and diarrhea. She was ultimately diagnosed with gallstones and had her gallbladder out in October. She had cholelithiasis and chronic cholecystitis. Symptoms little different then prior to gallbladder surgery. Never really well since GB surgery. Chronically has had more soft to loose stools, but usually once daily. Now having 4-5 loose stools daily. Describes her stools as fatty with grease in them. No heartburn, reflux. Nausea worse with meals/liquids. After eat nausea severe, within one hour diarrhea. Dry heaves. Bronchitis recent (given cipro but could not take it due to vomiting).  Mid-abdomen pain constant, worse with meals. BM 4-5 loose stools with severe abdominal cramps, with intermittent brbpr. No melena. She reports 30 pound weight loss since 10/12, unintentional. She weighed 240 pounds on October 17 to Dr. Eliberto Ivory office. Weighs 232 today.   Cipro only two days, otherwise last antibiotics were in September. C Diff at [facility] negative per patient.  Current Outpatient  Prescriptions  Medication Sig Dispense Refill  . acetaminophen (TYLENOL) 500 MG tablet Take 1,000 mg by mouth every 6 (six) hours as needed. Headache       . LORazepam (ATIVAN) 0.5 MG tablet Take 0.5 mg by mouth every 8 (eight) hours as needed. For anxiety       . ondansetron (ZOFRAN) 4 MG tablet Take 1 tablet (4 mg total) by mouth every 6 (six) hours as needed for nausea.  6 tablet  0  .       Marland Kitchen        Allergies as of 02/16/2011 - Review Complete 02/16/2011  Allergen Reaction Noted  . Percocet (oxycodone-acetaminophen) Nausea And Vomiting 12/06/2010  . Promethazine Other (See Comments) 11/05/2010  . Reglan Palpitations 02/16/2011    Past Medical History  Diagnosis Date  . Kidney stones   . Abdominal pain   . Nausea & vomiting   . Rectal bleeding   . Dizziness   . Syncope   . Generalized headaches   . Sinus problem   . GERD (gastroesophageal reflux disease)   . Bipolar disorder     patient states she was misdiagnosed, has not been on meds in years    Past Surgical History  Procedure Date  . Appendectomy   . Dilation and curettage of uterus   . Cholecystectomy 12/2010    Family History  Problem Relation Age of Onset  . Hypertension Mother   . Hypertension Father   . Ulcerative colitis Maternal Aunt     UC, age 16. Colostomy for severe UC. Liver transplant mid-20s (some sort of complication/Staph infection. H/O Primary Sclerosing Cholangitis.  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   . Irritable bowel syndrome Sister     History   Social History  . Marital Status: Married    Spouse Name: N/A    Number of Children: 2  . Years of Education: N/A   Occupational History  .     Social History Main Topics  . Smoking status: Never Smoker   . Smokeless tobacco: Never Used  . Alcohol Use: No  . Drug Use: No  . Sexually Active: Not on file   Other Topics Concern  . Not on file   Social History Narrative   Two children age 33, age 64 months.       ROS:  General: Positive for anorexia, weight loss. Negative for fever, chills, fatigue, weakness. Eyes: Negative for vision changes.  ENT: Negative for hoarseness, difficulty swallowing , nasal congestion. CV: Negative for chest pain, angina, palpitations, dyspnea on exertion, peripheral edema.  Respiratory: Negative for dyspnea at rest, dyspnea on exertion, cough, sputum, wheezing.  GI: See history of present illness. GU:  Negative for dysuria, hematuria, urinary incontinence, urinary frequency, nocturnal urination.  MS: Negative for joint pain, low back pain.  Derm: Negative for rash or itching.  Neuro: Negative for weakness, abnormal sensation, seizure, frequent headaches, memory loss, confusion.  Psych: Negative for anxiety, depression, suicidal ideation, hallucinations.  Endo: Negative for unusual weight change.  Heme: Negative for bruising or bleeding. Allergy: Negative for rash or hives.    Physical Examination:  BP 100/70  Pulse 72  Temp(Src) 97.6 F (36.4 C) (Temporal)  Ht 5\' 7"  (1.702 m)  Wt 232 lb 9.6 oz (105.507 kg)  BMI 36.43 kg/m2  LMP 02/09/2011  Breastfeeding? No   General: Well-nourished, well-developed in no acute distress. Accompanied by mother. Patient is tearful. Head: Normocephalic, atraumatic.   Eyes: Conjunctiva pink, no icterus. Mouth: Oropharyngeal mucosa moist and pink , no lesions erythema or exudate. Neck: Supple without thyromegaly, masses, or lymphadenopathy.  Lungs: Clear to auscultation bilaterally.  Heart: Regular rate and rhythm, no murmurs rubs or gallops.  Abdomen: Bowel sounds are normal, mild left sided abdominal pain. Abdomen is obese and soft. No hepatosplenomegaly or masses, no abdominal bruits or    hernia , no rebound or guarding.   Rectal: Deferred to time of colonoscopy. Extremities: No lower extremity edema. No clubbing or deformities.  Neuro: Alert and oriented x 4 , grossly normal neurologically.  Skin: Warm and dry, no rash  or jaundice.   Psych: Alert and cooperative, normal mood and affect.  Labs: [facility] 02/15/2011: Sodium 142, potassium 3.6, BUN 9, creatinine 0.83, glucose 81, albumin 3.4, alkaline phosphatase 55, AST 34, ALT 68, H. pylori serology negative, lactic acid 1.4, white blood cell count 4.9, hemoglobin 14.8, platelets 196,000 urinalysis showed specific gravity 1.018, ketones 15, pH 7.0 otherwise negative C. difficile collected but results not available.  Lab Results  Component Value Date   ALT 19 02/09/2011   AST 14 02/09/2011   ALKPHOS 85 02/09/2011   BILITOT 0.2* 02/09/2011   Lab Results  Component Value Date   CREATININE 0.92 02/09/2011   BUN 9 02/09/2011   NA 138 02/09/2011   K 3.4* 02/09/2011   CL 103 02/09/2011   CO2 26 02/09/2011   Lab Results  Component Value Date   WBC 4.1 02/09/2011   HGB 13.3 02/09/2011   HCT 40.0 02/09/2011   MCV 81.6 02/09/2011   PLT 217 02/09/2011    Imaging Studies:  Dg Chest 2 View  02/08/2011  *RADIOLOGY REPORT*  Clinical Data: Cough, chills, congestion.  Chest pain.  CHEST - 2 VIEW  Comparison: 12/15/2010  Findings: Heart and mediastinal contours are within normal limits. No focal opacities or effusions.  No acute bony abnormality.  IMPRESSION: No active cardiopulmonary disease.  Original Report Authenticated By: Cyndie Chime, M.D.   Ct Head Wo Contrast  02/09/2011  *RADIOLOGY REPORT*  Clinical Data: Syncope.  Headache.  Dizziness.  CT HEAD WITHOUT CONTRAST  Technique:  Contiguous axial images were obtained from the base of the skull through the vertex without contrast.  Comparison: 11/10/2010  Findings: The brain has a normal appearance without evidence of malformation, atrophy, old or acute infarction, mass lesion, hemorrhage, hydrocephalus or extra-axial collection.  The calvarium is unremarkable.  Sinuses, middle ears and mastoids are clear.  IMPRESSION: Normal examination.  Note that this is the third normal head CT in 3  months.  Original Report Authenticated By: Thomasenia Sales, M.D.   Dg Abd Acute W/chest  02/09/2011  *RADIOLOGY REPORT*  Clinical Data: Chest pain.  Abdominal pain.  Fever.  Rule out small bowel obstruction or free intraperitoneal air.  ACUTE ABDOMEN SERIES (ABDOMEN 2 VIEW & CHEST 1 VIEW)  Comparison: CT of 01/06/2011.  Plain film chest of 02/08/2011.  Findings: Frontal view of the chest demonstrates midline trachea. Normal heart size and mediastinal contours. No pleural effusion or pneumothorax.  Clear lungs.  Abominal films demonstrate no free intraperitoneal air or significant air fluid levels.  Minimal S-shaped thoracolumbar spine curvature.  Cholecystectomy clips.  No bowel distention. No abnormal abdominal calcifications.   No appendicolith.  Distal gas.  IMPRESSION: No acute findings.  Original Report Authenticated By: Consuello Bossier, M.D.   CT A/P, 01/06/11 Impression: 1. Small nonspecific fluid collection within the cholecystectomy  bed status post recent cholecystectomy. No generalized ascites,  perihepatic fluid or biliary dilatation.  2. Expected postsurgical changes at the umbilicus without  demonstrated complication.  3. Nonobstructing renal calculi bilaterally.  4. No acute findings demonstrated.

## 2011-02-16 NOTE — Assessment & Plan Note (Addendum)
Intractable nausea with dry heaves. Diminished po intake. Recommend EGD. CT 10/12 unrevealing. ALT recently up, will recheck. Check HCG. Menses not "normal". Very light bleeding 02/09/11.  I have discussed the risks, alternatives, benefits with regards to but not limited to the risk of reaction to medication, bleeding, infection, perforation and the patient is agreeable to proceed. Written consent to be obtained.  Add Dexilant 60mg  daily.

## 2011-02-16 NOTE — Progress Notes (Signed)
Cc to PCP 

## 2011-02-17 ENCOUNTER — Encounter: Payer: Self-pay | Admitting: Internal Medicine

## 2011-02-17 ENCOUNTER — Telehealth: Payer: Self-pay

## 2011-02-17 LAB — HEPATIC FUNCTION PANEL
ALT: 62 U/L — ABNORMAL HIGH (ref 0–35)
AST: 25 U/L (ref 0–37)
Bilirubin, Direct: 0.1 mg/dL (ref 0.0–0.3)
Indirect Bilirubin: 0.4 mg/dL (ref 0.0–0.9)

## 2011-02-17 LAB — CERULOPLASMIN: Ceruloplasmin: 27 mg/dL (ref 20–60)

## 2011-02-17 LAB — HEPATITIS B SURFACE ANTIGEN: Hepatitis B Surface Ag: NEGATIVE

## 2011-02-17 LAB — IRON AND TIBC: UIBC: 321 ug/dL (ref 125–400)

## 2011-02-17 NOTE — Telephone Encounter (Signed)
Pt said her CT of abdomen was done on 02/13/2011 at [facility] in [place]. Said to call her at [phone] if you have questions.

## 2011-02-17 NOTE — Telephone Encounter (Signed)
Records received

## 2011-02-17 NOTE — Telephone Encounter (Signed)
Per Soledad Gerlach, results given to Tana Coast, PA.

## 2011-02-17 NOTE — Telephone Encounter (Signed)
I didn't even know she had one. Please call and get report.

## 2011-02-18 ENCOUNTER — Telehealth: Payer: Self-pay | Admitting: Gastroenterology

## 2011-02-18 NOTE — Telephone Encounter (Signed)
Pt called asking to reschedule her procedure from 12/13 to 12/18 due to work issues- this has been done

## 2011-02-19 ENCOUNTER — Telehealth: Payer: Self-pay | Admitting: Internal Medicine

## 2011-02-19 NOTE — Progress Notes (Signed)
Quick Note:  1. ALT slightly elevated at 62. Hepatitis B and C. serologies were negative. Other labs looking into her elevated ALT were unremarkable. No hemochromatosis, Wilson's disease. Suspect fatty liver. 2. Stool culture is pending. C. difficile was negative. 3. Iron saturation slightly low, ferritin low normal. Likely related to pregnancy earlier this year. Plan to get small bowel biopsy at time of upper endoscopy to rule out celiac. Celiac antibody panel was negative and 2009.  Procedures as planned. Recheck LFTs, iron and TIBC, ferritin in 3 months. Instructions for fatty liver: Recommend 1-2# weight loss per week until ideal body weight through exercise & diet. Low fat/cholesterol diet. Gradually increase exercise from 15 min daily up to 1 hr per day 5 days/week. Limit alcohol use.     ______

## 2011-02-19 NOTE — Telephone Encounter (Signed)
See result note.  

## 2011-02-19 NOTE — Telephone Encounter (Signed)
Routed to LSL 

## 2011-02-19 NOTE — Telephone Encounter (Signed)
Pt has called this morning wanting to get her lab results that she had done on Monday. Please call her at [phone] or [phone]

## 2011-02-19 NOTE — Progress Notes (Signed)
REVIEWED.  

## 2011-02-19 NOTE — Telephone Encounter (Signed)
Reviewed CT of the abdomen and pelvis done on 02/13/2011 at [facility]. Status post cholecystectomy with small residual fluid collection, likely small postoperative collection such as a seroma or lymphocele. 3 mm right kidney stone, nonobstructing. Tiny nonobstructive stone in the left kidney. Minimal focal fatty infiltration of the liver adjacent to falciform fissure. No definite acute intra-abdominal or intrapelvic abnormalities.

## 2011-02-20 LAB — STOOL CULTURE

## 2011-02-23 MED ORDER — SODIUM CHLORIDE 0.45 % IV SOLN: Freq: Once | INTRAVENOUS | Status: DC

## 2011-02-24 ENCOUNTER — Telehealth: Payer: Self-pay | Admitting: Gastroenterology

## 2011-02-24 NOTE — Telephone Encounter (Signed)
Pt called to reschedule her procedure ( son sick) it has been changed to 12/27-went over instructions and times -she voiced her understanding

## 2011-02-24 NOTE — Progress Notes (Signed)
Patient ID: [name], female   DOB: 1975-11-17, 35 y.o.   MRN: [id number] Pt called wanting test result. I gave her the results and she understood them with not question.

## 2011-02-25 NOTE — Progress Notes (Signed)
Quick Note:  Stool culture negative. Plans as previously outlined. ______

## 2011-03-05 ENCOUNTER — Encounter (HOSPITAL_COMMUNITY): Admission: RE | Payer: Self-pay | Source: Ambulatory Visit

## 2011-03-05 ENCOUNTER — Ambulatory Visit (HOSPITAL_COMMUNITY)
Admission: RE | Admit: 2011-03-05 | Payer: BC Managed Care – PPO | Source: Ambulatory Visit | Admitting: Internal Medicine

## 2011-03-05 SURGERY — COLONOSCOPY
Anesthesia: Moderate Sedation

## 2011-03-17 ENCOUNTER — Encounter (INDEPENDENT_AMBULATORY_CARE_PROVIDER_SITE_OTHER): Payer: Self-pay | Admitting: General Surgery

## 2011-03-25 ENCOUNTER — Telehealth: Payer: Self-pay | Admitting: Gastroenterology

## 2011-03-25 NOTE — Telephone Encounter (Signed)
Please see if patient ready to reschedule her EGD with SB biopsies and colonoscopy with terminal ileoscopy. She rescheduled and/or cancelled three times now.   If she is not going to reschedule procedures she can make an appt for f/u.

## 2011-03-25 NOTE — Telephone Encounter (Signed)
[name], make sure if patient does reschedule that you put on order for the small bowel bx and terminal ileoscopy as a reminder to RMR.  We will also need to update when patients last menstrual cycle was if she reschedules.

## 2011-03-25 NOTE — Telephone Encounter (Signed)
Called and left message on VM.

## 2011-03-26 NOTE — Telephone Encounter (Signed)
As discussed with Dr. Jena Gauss and Aggie Cosier, patient will need OV prior to rescheduling procedures.

## 2011-04-14 NOTE — Telephone Encounter (Signed)
Let's please send patient a letter and then close this chapter. Thanks.

## 2011-04-14 NOTE — Telephone Encounter (Signed)
Letter mailed to pt.  

## 2011-06-08 ENCOUNTER — Ambulatory Visit (HOSPITAL_COMMUNITY): Payer: BC Managed Care – PPO | Admitting: Psychiatry

## 2011-06-09 ENCOUNTER — Ambulatory Visit (HOSPITAL_COMMUNITY): Payer: BC Managed Care – PPO | Admitting: Psychiatry

## 2011-06-25 ENCOUNTER — Ambulatory Visit (INDEPENDENT_AMBULATORY_CARE_PROVIDER_SITE_OTHER): Payer: BC Managed Care – PPO | Admitting: Psychology

## 2011-06-25 DIAGNOSIS — F411 Generalized anxiety disorder: Secondary | ICD-10-CM

## 2011-06-25 DIAGNOSIS — F4001 Agoraphobia with panic disorder: Secondary | ICD-10-CM

## 2011-06-25 DIAGNOSIS — F419 Anxiety disorder, unspecified: Secondary | ICD-10-CM

## 2011-07-07 ENCOUNTER — Ambulatory Visit (INDEPENDENT_AMBULATORY_CARE_PROVIDER_SITE_OTHER): Payer: BC Managed Care – PPO | Admitting: Psychology

## 2011-07-07 DIAGNOSIS — F419 Anxiety disorder, unspecified: Secondary | ICD-10-CM

## 2011-07-07 DIAGNOSIS — F411 Generalized anxiety disorder: Secondary | ICD-10-CM

## 2011-07-07 DIAGNOSIS — F4001 Agoraphobia with panic disorder: Secondary | ICD-10-CM

## 2011-07-08 ENCOUNTER — Ambulatory Visit (HOSPITAL_COMMUNITY): Payer: Self-pay | Admitting: Psychology

## 2011-07-22 ENCOUNTER — Ambulatory Visit (HOSPITAL_COMMUNITY): Payer: Self-pay | Admitting: Psychology

## 2011-08-18 ENCOUNTER — Encounter (HOSPITAL_COMMUNITY): Payer: Self-pay | Admitting: Psychology

## 2011-08-18 ENCOUNTER — Ambulatory Visit (HOSPITAL_COMMUNITY): Payer: Self-pay | Admitting: Psychology

## 2011-08-18 NOTE — Progress Notes (Signed)
Patient:  [name]   DOB: 12/21/1975  MR Number: [id number]  Location: [facility]IOR[facility] [address] [place] [address] Dept: [phone]  Start: 1 PM End: 2 PM  Provider/Observer:     Hershal Coria PSYD  Chief Complaint:      Chief Complaint  Patient presents with  . Anxiety  . Panic Attack    Reason For Service:     The patient reports that she started having panic attacks during her pregnancy and that her son is now 67 months old. The patient reports that she is often getting them during her menstrual cycle and that not only does she have these panic attacks she also gets significant headaches. The patient reports that prior to her pregnancy she did not have problems with her menstrual cycle. The patient reports that she is major stressors right now have to do with her family situation. The patient reports that other stressors also the ability to trigger panic attacks various times. This is been going on for one year. The patient reports that when these events happen is very scary and she feels a the symptoms would never want go away and that she desperately wants her "cold-like." The patient was treated for depression back in 2006 and treated with Luvox for OCD and symptoms of depression.  Interventions Strategy:  Cognitive/behavioral psychotherapeutic interventions  Participation Level:   Active  Participation Quality:  Appropriate      Behavioral Observation:  Well Groomed, Alert, and Appropriate.   Current Psychosocial Factors: The patient reports that she's begun working on reducing some of the issues that might psychosocial features and stressors of her life.  Content of Session:   Reviewed current symptoms and continued work on therapeutic interventions for issues related to recurrent panic attacks.  Current Status:   The patient reports that she's been actively working on some  of the initial therapeutic goals we stayed including learning diaphragmatic breathing and other relaxation techniques and better understanding (of her panic attacks..  Patient Progress:   Stable  Target Goals:   Target goals include reducing frequency, intensity, and duration of panic attacks and reducing the overall level of anxiety including feelings of tension, fear, and worry.  Last Reviewed:   07/07/2011  Goals Addressed Today:    Today we worked on issues related to therapeutic interventions for panic attacks.  Impression/Diagnosis:   The patient has a history of prior depression back in 2006 as well as OCD and other anxiety symptoms. When she was pregnant with her son who is now nearly 32-year-old she began developing panic attacks and these have persisted with her menstrual cycles primarily as well as headaches.  Diagnosis:    Axis I:  1. Panic disorder with agoraphobia   2. Anxiety         Axis II: No diagnosis

## 2011-08-18 NOTE — Progress Notes (Signed)
Patient:  [name]   DOB: 1975-03-13  MR Number: [id number]  Location: [facility]HAV[facility] [address] [place] [address] Dept: [phone]  Start: 3 PM End: 4 PM  Provider/Observer:     Hershal Coria PSYD  Chief Complaint:      Chief Complaint  Patient presents with  . Anxiety  . Panic Attack    Reason For Service:     The patient reports that she started having panic attacks during her pregnancy and that her son is now 62 months old. The patient reports that she is often getting them during her menstrual cycle and that not only does she have these panic attacks she also gets significant headaches. The patient reports that prior to her pregnancy she did not have problems with her menstrual cycle. The patient reports that she is major stressors right now have to do with her family situation. The patient reports that other stressors also the ability to trigger panic attacks various times. This is been going on for one year. The patient reports that when these events happen is very scary and she feels a the symptoms would never want go away and that she desperately wants her "cold-like." The patient was treated for depression back in 2006 and treated with Luvox for OCD and symptoms of depression.  Interventions Strategy:  Cognitive/behavioral psychotherapeutic interventions  Participation Level:   Active  Participation Quality:  Appropriate      Behavioral Observation:  Well Groomed, Alert, and Appropriate.   Current Psychosocial Factors: The patient does report that she has a lot of stressors within the family right now and at these and to her difficulties.  Content of Session:   Reviewed current symptoms and continued work on therapeutic interventions for issues related to recurrent panic attacks.  Current Status:   The patient reports that she is continuing panic attacks during her  menstrual cycle as well as other times of high psychosocial stresses.  Patient Progress:   Stable  Target Goals:   Target goals include reducing frequency, intensity, and duration of panic attacks and reducing the overall level of anxiety including feelings of tension, fear, and worry.  Last Reviewed:   06/25/2011  Goals Addressed Today:    Today we worked on issues related to therapeutic interventions for panic attacks.  Impression/Diagnosis:   The patient has a history of prior depression back in 2006 as well as OCD and other anxiety symptoms. When she was pregnant with her son who is now nearly 52-year-old she began developing panic attacks and these have persisted with her menstrual cycles primarily as well as headaches.  Diagnosis:    Axis I:  1. Panic disorder with agoraphobia   2. Anxiety         Axis II: No diagnosis

## 2011-09-04 ENCOUNTER — Ambulatory Visit (INDEPENDENT_AMBULATORY_CARE_PROVIDER_SITE_OTHER): Payer: BC Managed Care – PPO | Admitting: Psychology

## 2011-09-04 DIAGNOSIS — F419 Anxiety disorder, unspecified: Secondary | ICD-10-CM

## 2011-09-04 DIAGNOSIS — F411 Generalized anxiety disorder: Secondary | ICD-10-CM

## 2011-09-04 DIAGNOSIS — F4001 Agoraphobia with panic disorder: Secondary | ICD-10-CM

## 2011-09-07 ENCOUNTER — Encounter (HOSPITAL_COMMUNITY): Payer: Self-pay | Admitting: Psychology

## 2011-09-07 NOTE — Progress Notes (Signed)
Patient:  [name]   DOB: Oct 11, 1975  MR Number: [id number]  Location: [facility] [address] [place] [address] Dept: [phone]  Start: 9 AM End: 10 AM  Provider/Observer:     Hershal Coria PSYD  Chief Complaint:      Chief Complaint  Patient presents with  . Anxiety  . Panic Attack    Reason For Service:     The patient reports that she started having panic attacks during her pregnancy and that her son is now 55 months old. The patient reports that she is often getting them during her menstrual cycle and that not only does she have these panic attacks she also gets significant headaches. The patient reports that prior to her pregnancy she did not have problems with her menstrual cycle. The patient reports that she is major stressors right now have to do with her family situation. The patient reports that other stressors also the ability to trigger panic attacks various times. This is been going on for one year. The patient reports that when these events happen is very scary and she feels a the symptoms would never want go away and that she desperately wants her "cold-like." The patient was treated for depression back in 2006 and treated with Luvox for OCD and symptoms of depression.  Interventions Strategy:  Cognitive/behavioral psychotherapeutic interventions  Participation Level:   Active  Participation Quality:  Appropriate      Behavioral Observation:  Well Groomed, Alert, and Appropriate.   Current Psychosocial Factors: The patient reports that she is doing much better with her overall functioning. She reports that she does feel like she is ready to try an SSRI medication and acknowledges that Luvox helped in the past but may like to try something different. Talk with her about going back and talking with her gynecologist about this as there is a strong association with her  menstrual cycle. 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  Content of Session:   Reviewed current symptoms and continued work on therapeutic interventions for issues related to recurrent panic attacks.  Current Status:   The patient reports that she's been actively working on some of the initial therapeutic goals we stayed including learning diaphragmatic breathing and other relaxation techniques and better understanding (of her panic attacks..  Patient Progress:   Stable  Target Goals:   Target goals include reducing frequency, intensity, and duration of panic attacks and reducing the overall level of anxiety including feelings of tension, fear, and worry.  Last Reviewed:   09/04/2011  Goals Addressed Today:    Today we worked on issues related to therapeutic interventions for panic attacks.  Impression/Diagnosis:   The patient has a history of prior depression back in 2006 as well as OCD and other anxiety symptoms. When she was pregnant with her son who is now nearly 6-year-old she began developing panic attacks and these have persisted with her menstrual cycles primarily as well as headaches.  Diagnosis:    Axis I:  1. Panic disorder with agoraphobia   2. Anxiety         Axis II: No diagnosis

## 2011-09-15 ENCOUNTER — Other Ambulatory Visit (HOSPITAL_COMMUNITY): Payer: Self-pay | Admitting: Family Medicine

## 2011-09-15 ENCOUNTER — Ambulatory Visit (HOSPITAL_COMMUNITY)
Admission: RE | Admit: 2011-09-15 | Discharge: 2011-09-15 | Disposition: A | Discharge status: Home / Self Care | Payer: BC Managed Care – PPO | Source: Ambulatory Visit | Attending: Family Medicine | Admitting: Family Medicine

## 2011-09-15 DIAGNOSIS — M79609 Pain in unspecified limb: Secondary | ICD-10-CM | POA: Insufficient documentation

## 2011-09-23 ENCOUNTER — Ambulatory Visit (INDEPENDENT_AMBULATORY_CARE_PROVIDER_SITE_OTHER): Payer: BC Managed Care – PPO | Admitting: Psychology

## 2011-09-23 ENCOUNTER — Ambulatory Visit (HOSPITAL_COMMUNITY): Payer: Self-pay | Admitting: Psychology

## 2011-09-23 DIAGNOSIS — F419 Anxiety disorder, unspecified: Secondary | ICD-10-CM

## 2011-09-23 DIAGNOSIS — F411 Generalized anxiety disorder: Secondary | ICD-10-CM

## 2011-09-23 DIAGNOSIS — F4001 Agoraphobia with panic disorder: Secondary | ICD-10-CM

## 2011-09-25 ENCOUNTER — Encounter (HOSPITAL_COMMUNITY): Payer: Self-pay | Admitting: Psychology

## 2011-09-25 NOTE — Progress Notes (Signed)
Patient:  [name]   DOB: 1976/02/08  MR Number: [id number]  Location: [facility] [address] [place] [address] Dept: [phone]  Start: 3 PM End: 4 PM  Provider/Observer:     Hershal Coria PSYD  Chief Complaint:      Chief Complaint  Patient presents with  . Anxiety  . Panic Attack  . Family Problem    Reason For Service:     The patient reports that she started having panic attacks during her pregnancy and that her son is now 78 months old. The patient reports that she is often getting them during her menstrual cycle and that not only does she have these panic attacks she also gets significant headaches. The patient reports that prior to her pregnancy she did not have problems with her menstrual cycle. The patient reports that she is major stressors right now have to do with her family situation. The patient reports that other stressors also the ability to trigger panic attacks various times. This is been going on for one year. The patient reports that when these events happen is very scary and she feels a the symptoms would never want go away and that she desperately wants her "old life back." The patient was treated for depression back in 2006 and treated with Luvox for OCD and symptoms of depression.  Interventions Strategy:  Cognitive/behavioral psychotherapeutic interventions  Participation Level:   Active  Participation Quality:  Appropriate      Behavioral Observation:  Well Groomed, Alert, and Appropriate.   Current Psychosocial Factors: The patient and her husband continue to have some significant problems. There've been a number of continued situations with phone calls coming up his restricted numbers on his worked on at odd hours and when the patient tries to discuss with her husband how this makes her feel he simply begins to get mad at her and yelled at her [employer].  We talked about the issue related to him getting very angry accused of something fruit was not true but also the fact that he will not address this with her and not make any efforts to try to get a different #4 his worked on continued. The lady that had been seen talking to her husband and then printed the patient on face look was on friend and by the patient and within a week or so this lady tried to re\re contact the patient which upset her greatly and she cannot understand why this woman keeps trying to make such a attempt at connection with her. Today, the patient came in with her husband and we had session with both of them..  Content of Session:   Reviewed current symptoms and continued work on therapeutic interventions for issues related to recurrent panic attacks.  Current Status:   The patient reports that she's been actively working on some of the initial therapeutic goals we stayed including learning diaphragmatic breathing and other relaxation techniques and better understanding (of her panic attacks..  Patient Progress:   Stable  Target Goals:   Target goals include reducing frequency, intensity, and duration of panic attacks and reducing the overall level of anxiety including feelings of tension, fear, and worry.  Last Reviewed:   09/23/2011  Goals Addressed Today:    Today we worked on issues related to therapeutic interventions for panic attacks.  Impression/Diagnosis:   The patient has a history of prior depression back in 2006 as  well as OCD and other anxiety symptoms. When she was pregnant with her son who is now nearly 36-year-old she began developing panic attacks and these have persisted with her menstrual cycles primarily as well as headaches.  Diagnosis:    Axis I:  1. Panic disorder with agoraphobia   2. Anxiety         Axis II: No diagnosis

## 2011-10-07 ENCOUNTER — Ambulatory Visit (HOSPITAL_COMMUNITY): Payer: Self-pay | Admitting: Psychology

## 2011-11-14 ENCOUNTER — Emergency Department (HOSPITAL_COMMUNITY): Payer: BC Managed Care – PPO

## 2011-11-14 ENCOUNTER — Emergency Department (HOSPITAL_COMMUNITY)
Admission: EM | Admit: 2011-11-14 | Discharge: 2011-11-14 | Disposition: A | Discharge status: Home / Self Care | Payer: BC Managed Care – PPO | Attending: Emergency Medicine | Admitting: Emergency Medicine

## 2011-11-14 ENCOUNTER — Encounter (HOSPITAL_COMMUNITY): Payer: Self-pay | Admitting: Emergency Medicine

## 2011-11-14 DIAGNOSIS — K219 Gastro-esophageal reflux disease without esophagitis: Secondary | ICD-10-CM | POA: Insufficient documentation

## 2011-11-14 DIAGNOSIS — Z9089 Acquired absence of other organs: Secondary | ICD-10-CM | POA: Insufficient documentation

## 2011-11-14 DIAGNOSIS — N200 Calculus of kidney: Secondary | ICD-10-CM | POA: Insufficient documentation

## 2011-11-14 DIAGNOSIS — N390 Urinary tract infection, site not specified: Secondary | ICD-10-CM

## 2011-11-14 DIAGNOSIS — F319 Bipolar disorder, unspecified: Secondary | ICD-10-CM | POA: Insufficient documentation

## 2011-11-14 DIAGNOSIS — M7989 Other specified soft tissue disorders: Secondary | ICD-10-CM | POA: Insufficient documentation

## 2011-11-14 LAB — POCT I-STAT, CHEM 8
BUN: 10 mg/dL (ref 6–23)
Chloride: 103 mEq/L (ref 96–112)
Creatinine, Ser: 1 mg/dL (ref 0.50–1.10)
Glucose, Bld: 88 mg/dL (ref 70–99)
Potassium: 3.3 mEq/L — ABNORMAL LOW (ref 3.5–5.1)

## 2011-11-14 LAB — URINALYSIS, ROUTINE W REFLEX MICROSCOPIC
Glucose, UA: NEGATIVE mg/dL
Leukocytes, UA: NEGATIVE
Nitrite: NEGATIVE
Protein, ur: NEGATIVE mg/dL
Urobilinogen, UA: 0.2 mg/dL (ref 0.0–1.0)

## 2011-11-14 MED ORDER — HYDROCODONE-ACETAMINOPHEN 5-325 MG PO TABS
2.0000 | ORAL_TABLET | Freq: Once | ORAL | Status: AC
  Administered 2011-11-14: 2 via ORAL
  Filled 2011-11-14: qty 2

## 2011-11-14 MED ORDER — NAPROXEN 250 MG PO TABS: 250.0000 mg | ORAL_TABLET | Freq: Two times a day (BID) | ORAL | Status: DC

## 2011-11-14 MED ORDER — CEPHALEXIN 500 MG PO CAPS: 500.0000 mg | ORAL_CAPSULE | Freq: Four times a day (QID) | ORAL | Status: AC

## 2011-11-14 MED ORDER — IBUPROFEN 400 MG PO TABS
400.0000 mg | ORAL_TABLET | Freq: Once | ORAL | Status: AC
  Administered 2011-11-14: 200 mg via ORAL
  Filled 2011-11-14: qty 1

## 2011-11-14 MED ORDER — HYDROCODONE-ACETAMINOPHEN 5-325 MG PO TABS: ORAL_TABLET | ORAL | Status: AC

## 2011-11-14 MED ORDER — POTASSIUM CHLORIDE 20 MEQ/15ML (10%) PO LIQD
40.0000 meq | Freq: Once | ORAL | Status: AC
  Administered 2011-11-14: 40 meq via ORAL
  Filled 2011-11-14: qty 30

## 2011-11-14 NOTE — ED Provider Notes (Signed)
History     CSN: [id number]  Arrival date & time 11/14/11  1330   First MD Initiated Contact with Patient 11/14/11 1421      Chief Complaint  Patient presents with  . Urinary Retention  . Foot Swelling     HPI Pt was seen at 1440.  Per pt, c/o gradual onset and persistence of constant dysuria for the past 4 days.  Pt describes her symptoms as urinary burning, frequency, and urgency.  Symptoms were associated with her low back "aching" for the past 4 days.  States she "took some old macrobid for a few days" and "drank a lot of water" with mild improvement in urinary symptoms.  Pt also c/o gradual onset and persistence of constant "legs cramping" for the past several days.  States she is "worried about my potassium."  Denies fevers, no rash, no vaginal bleeding/discharge, no abd pain, no N/V/D, no CP/palpitations, no SOB/cough, no calf/LE unilateral swelling.    Past Medical History  Diagnosis Date  . Kidney stones   . Abdominal pain   . Nausea & vomiting   . Rectal bleeding   . Dizziness   . Syncope   . Generalized headaches   . Sinus problem   . GERD (gastroesophageal reflux disease)   . Bipolar disorder     patient states she was misdiagnosed, has not been on meds in years    Past Surgical History  Procedure Date  . Appendectomy   . Dilation and curettage of uterus   . Cholecystectomy 12/2010    Family History  Problem Relation Age of Onset  . Hypertension Mother   . Hypertension Father   . Ulcerative colitis Maternal Aunt     UC, age 17. Colostomy for severe UC. Liver transplant mid-20s (some sort of complication/Staph infection. 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   . Irritable bowel syndrome Sister     History  Substance Use Topics  . Smoking status: Never Smoker   . Smokeless tobacco: Never Used  . Alcohol Use: No    OB History    Grav Para Term Preterm Abortions TAB SAB Ect Mult Living   2 2        2        Review of Systems ROS: Statement: All systems negative except as marked or noted in the HPI; Constitutional: Negative for fever and chills. ; ; Eyes: Negative for eye pain, redness and discharge. ; ; ENMT: Negative for ear pain, hoarseness, nasal congestion, sinus pressure and sore throat. ; ; Cardiovascular: Negative for chest pain, palpitations, diaphoresis, dyspnea and peripheral edema. ; ; Respiratory: Negative for cough, wheezing and stridor. ; ; Gastrointestinal: Negative for nausea, vomiting, diarrhea, abdominal pain, blood in stool, hematemesis, jaundice and rectal bleeding. . ; ; Genitourinary: +dysuria. Negative for flank pain and hematuria. ; ; GYN:  No vaginal bleeding, no vaginal discharge, no vulvar pain.;;  Musculoskeletal: +LBP, muscle cramps. Negative for neck pain. Negative for swelling and trauma.; ; Skin: Negative for pruritus, rash, abrasions, blisters, bruising and skin lesion.; ; Neuro: Negative for headache, lightheadedness and neck stiffness. Negative for weakness, altered level of consciousness , altered mental status, extremity weakness, paresthesias, involuntary movement, seizure and syncope.       Allergies  Percocet; Promethazine; and Metoclopramide hcl  Home Medications   Current Outpatient Rx  Name Route Sig Dispense Refill  . ACETAMINOPHEN 500 MG PO TABS Oral Take 1,000 mg by mouth every 6 (six) hours as  needed. Headache     . LORAZEPAM 0.5 MG PO TABS Oral Take 0.5 mg by mouth daily as needed. For anxiety      BP 131/79  Pulse 95  Temp 98.6 F (37 C)  Resp 18  Ht 5\' 8"  (1.727 m)  Wt 221 lb (100.245 kg)  BMI 33.60 kg/m2  SpO2 99%  LMP 10/31/2011  Physical Exam 1445: Physical examination:  Nursing notes reviewed; Vital signs and O2 SAT reviewed;  Constitutional: Well developed, Well nourished, Well hydrated, In no acute distress; Head:  Normocephalic, atraumatic; Eyes: EOMI, PERRL, No scleral icterus; ENMT: Mouth and pharynx normal, Mucous membranes  moist; Neck: Supple, Full range of motion, No lymphadenopathy; Cardiovascular: Regular rate and rhythm, No murmur, rub, or gallop; Respiratory: Breath sounds clear & equal bilaterally, No rales, rhonchi, wheezes.  Speaking full sentences with ease, Normal respiratory effort/excursion; Chest: Nontender, Movement normal; Abdomen: Soft, Nontender, Nondistended, Normal bowel sounds; Genitourinary: No CVA tenderness; Spine:  No midline CS, TS, LS tenderness.  +mild TTP bilat lumbar paraspinal muscles;; Extremities: Pulses normal, No tenderness, No deformity. LE's muscle compartments soft. No edema, No calf edema or asymmetry.; Neuro: AA&Ox3, Major CN grossly intact.  Speech clear. Normal coordination.  No gross focal motor or sensory deficits in extremities.; Skin: Color normal, Warm, Dry, no rash.   ED Course  Procedures   MDM  MDM Reviewed: nursing note and vitals Interpretation: labs and CT scan   Results for orders placed during the hospital encounter of 11/14/11  PREGNANCY, URINE      Component Value Range   Preg Test, Ur NEGATIVE  NEGATIVE  URINALYSIS, ROUTINE W REFLEX MICROSCOPIC      Component Value Range   Color, Urine YELLOW  YELLOW   APPearance HAZY (*) CLEAR   Specific Gravity, Urine >1.030 (*) 1.005 - 1.030   pH 6.0  5.0 - 8.0   Glucose, UA NEGATIVE  NEGATIVE mg/dL   Hgb urine dipstick NEGATIVE  NEGATIVE   Bilirubin Urine SMALL (*) NEGATIVE   Ketones, ur TRACE (*) NEGATIVE mg/dL   Protein, ur NEGATIVE  NEGATIVE mg/dL   Urobilinogen, UA 0.2  0.0 - 1.0 mg/dL   Nitrite NEGATIVE  NEGATIVE   Leukocytes, UA NEGATIVE  NEGATIVE  POCT I-STAT, CHEM 8      Component Value Range   Sodium 140  135 - 145 mEq/L   Potassium 3.3 (*) 3.5 - 5.1 mEq/L   Chloride 103  96 - 112 mEq/L   BUN 10  6 - 23 mg/dL   Creatinine, Ser 1.61  0.50 - 1.10 mg/dL   Glucose, Bld 88  70 - 99 mg/dL   Calcium, Ion 0.96  1.12 - 1.23 mmol/L   TCO2 23  0 - 100 mmol/L   Hemoglobin 13.6  12.0 - 15.0 g/dL   HCT  04.5  40.9 - 81.1 %   Ct Abdomen Pelvis Wo Contrast 11/14/2011  *RADIOLOGY REPORT*  Clinical Data: Left-sided flank pain.  History of kidney stones. Status post appendectomy and cholecystectomy.  CT ABDOMEN AND PELVIS WITHOUT CONTRAST  Technique:  Multidetector CT imaging of the abdomen and pelvis was performed following the standard protocol without intravenous contrast.  Comparison: CT of the abdomen and pelvis 01/06/2011.  Findings:  Lung Bases: Unremarkable.  Abdomen/Pelvis:  Status post cholecystectomy.  A 4 mm nonobstructive calculus in the lower pole collecting system of the right kidney.  2 mm nonobstructive calculus in the upper pole collecting system of the left kidney.  No additional  calculi are noted along the course of either ureter or within the lumen of the urinary bladder.  No hydroureteronephrosis or perinephric stranding to suggest urinary tract obstruction at this time.  The unenhanced appearance of the liver, pancreas, spleen and bilateral adrenal glands is unremarkable.  Status post appendectomy.  The uterus and ovaries are unremarkable in appearance.  There is no ascites or pneumoperitoneum and no pathologic distension of bowel.  No pathologic lymphadenopathy identified within the abdomen or pelvis on this noncontrast CT examination.  There are a few scattered colonic diverticula, without surrounding inflammatory changes to suggest an acute diverticulitis at this time.  Musculoskeletal: There are no aggressive appearing lytic or blastic lesions noted in the visualized portions of the skeleton.  IMPRESSION: 1.  No acute findings in the abdomen or pelvis to account for the patient's symptoms. 2.  There are tiny nonobstructive calculi within the collecting systems of the kidneys bilaterally, as above. 3.  Status post appendectomy and cholecystectomy. 4.  Mild colonic diverticulosis without findings to suggest acute diverticulitis.   Original Report Authenticated By: Florencia Reasons, M.D.       [phone]:  No ureteral calculi.  Potassium repleted PO.  No UTI on dip, but pt has been taking "some left over macrobid" for the past several days.  Will tx presumed UTI with course of keflex.  Pt cautioned regarding not taking full courses of abx when they are rx and taking "left over" medicines (ie: abx resistant bacterial infection).  Feels "better" and wants to go home now.  Dx testing d/w pt.  Questions answered.  Verb understanding, agreeable to d/c home with outpt f/u.          Laray Anger, DO 11/16/11 1507

## 2011-11-14 NOTE — ED Notes (Signed)
Dr. Clarene Duke in room talking with pt

## 2011-11-14 NOTE — ED Notes (Signed)
Pt c/o burning with urination that started on Tuesday, feeling like she needs to urinate and then is not able to, bilateral flank "aching", pt states that she took two days of Macrobid that she had left over from a previous infection, and the burning with urination felt better, pt began to have leg cramps and bilateral swelling to feet area over the past day or two. Urine sample obtained and sent to lab

## 2011-11-14 NOTE — ED Notes (Signed)
Pt states she urinated this am, but not since and has drank 3 bottles of water. Pt c/o leg cramps, bilateral foot swelling, and bilateral lower back pain.

## 2011-11-15 ENCOUNTER — Encounter (HOSPITAL_COMMUNITY): Payer: Self-pay | Admitting: Emergency Medicine

## 2011-11-15 ENCOUNTER — Emergency Department (HOSPITAL_COMMUNITY)
Admission: EM | Admit: 2011-11-15 | Discharge: 2011-11-16 | Disposition: A | Discharge status: Home / Self Care | Payer: BC Managed Care – PPO | Attending: Emergency Medicine | Admitting: Emergency Medicine

## 2011-11-15 DIAGNOSIS — K219 Gastro-esophageal reflux disease without esophagitis: Secondary | ICD-10-CM | POA: Insufficient documentation

## 2011-11-15 DIAGNOSIS — F319 Bipolar disorder, unspecified: Secondary | ICD-10-CM | POA: Insufficient documentation

## 2011-11-15 DIAGNOSIS — R42 Dizziness and giddiness: Secondary | ICD-10-CM | POA: Insufficient documentation

## 2011-11-15 DIAGNOSIS — Z888 Allergy status to other drugs, medicaments and biological substances status: Secondary | ICD-10-CM | POA: Insufficient documentation

## 2011-11-15 DIAGNOSIS — Z8249 Family history of ischemic heart disease and other diseases of the circulatory system: Secondary | ICD-10-CM | POA: Insufficient documentation

## 2011-11-15 DIAGNOSIS — M62838 Other muscle spasm: Secondary | ICD-10-CM | POA: Insufficient documentation

## 2011-11-15 DIAGNOSIS — Z885 Allergy status to narcotic agent status: Secondary | ICD-10-CM | POA: Insufficient documentation

## 2011-11-15 DIAGNOSIS — Z8379 Family history of other diseases of the digestive system: Secondary | ICD-10-CM | POA: Insufficient documentation

## 2011-11-15 LAB — URINE CULTURE: Colony Count: 50000

## 2011-11-15 LAB — CBC WITH DIFFERENTIAL/PLATELET
Basophils Relative: 0 % (ref 0–1)
Eosinophils Absolute: 0.1 10*3/uL (ref 0.0–0.7)
HCT: 39.5 % (ref 36.0–46.0)
Hemoglobin: 13.2 g/dL (ref 12.0–15.0)
Lymphs Abs: 2.1 10*3/uL (ref 0.7–4.0)
MCH: 28.3 pg (ref 26.0–34.0)
MCHC: 33.4 g/dL (ref 30.0–36.0)
Monocytes Absolute: 0.5 10*3/uL (ref 0.1–1.0)
Monocytes Relative: 7 % (ref 3–12)
RBC: 4.67 MIL/uL (ref 3.87–5.11)

## 2011-11-15 LAB — BASIC METABOLIC PANEL
BUN: 13 mg/dL (ref 6–23)
CO2: 28 mEq/L (ref 19–32)
Chloride: 102 mEq/L (ref 96–112)
Creatinine, Ser: 0.99 mg/dL (ref 0.50–1.10)
Glucose, Bld: 83 mg/dL (ref 70–99)

## 2011-11-15 MED ORDER — DIAZEPAM 5 MG PO TABS
5.0000 mg | ORAL_TABLET | Freq: Once | ORAL | Status: AC
  Administered 2011-11-15: 5 mg via ORAL
  Filled 2011-11-15: qty 1

## 2011-11-15 MED ORDER — HYDROCODONE-ACETAMINOPHEN 5-325 MG PO TABS
1.0000 | ORAL_TABLET | Freq: Once | ORAL | Status: AC
  Administered 2011-11-15: 1 via ORAL
  Filled 2011-11-15: qty 1

## 2011-11-15 MED ORDER — ONDANSETRON HCL 4 MG PO TABS
4.0000 mg | ORAL_TABLET | Freq: Once | ORAL | Status: DC
  Filled 2011-11-15: qty 1

## 2011-11-15 MED ORDER — CYCLOBENZAPRINE HCL 10 MG PO TABS: 10.0000 mg | ORAL_TABLET | Freq: Two times a day (BID) | ORAL | Status: AC | PRN

## 2011-11-15 NOTE — ED Notes (Signed)
Patient reports was seen here yesterday and diagnosed with a urinary tract infection and hypokalemia. Patient reports that she felt better after taking the potassium, but states that she thinks her potassium is low again because she is having muscles spasms and cramps, headache, and dizziness.

## 2011-11-15 NOTE — ED Provider Notes (Addendum)
History     CSN: [id number]  Arrival date & time 11/15/11  2140   First MD Initiated Contact with Patient 11/15/11 2226      Chief Complaint  Patient presents with  . Spasms  . Dizziness    (Consider location/radiation/quality/duration/timing/severity/associated sxs/prior treatment) HPI Comments: Pt states she was evaluated at the [facility] campus on 11/14/11 and found to have a UTI and low potassium. She was treated and felt better at the time. Last night and today she continues to have muscle spasms of the lower extremities , c/o dizziness, headache and some cramping. No c/o vomiting or diarrhea. No excessive diaphoresis. Pt denies being on a diuretic medication. She has only taken tylenol for the problem, and request to be re-evaluated.  The history is provided by the patient.    Past Medical History  Diagnosis Date  . Kidney stones   . Abdominal pain   . Nausea & vomiting   . Rectal bleeding   . Dizziness   . Syncope   . Generalized headaches   . Sinus problem   . GERD (gastroesophageal reflux disease)   . Bipolar disorder     patient states she was misdiagnosed, has not been on meds in years    Past Surgical History  Procedure Date  . Appendectomy   . Dilation and curettage of uterus   . Cholecystectomy 12/2010    Family History  Problem Relation Age of Onset  . Hypertension Mother   . Hypertension Father   . Ulcerative colitis Maternal Aunt     UC, age 66. Colostomy for severe UC. Liver transplant mid-20s (some sort of complication/Staph infection. 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   . Irritable bowel syndrome Sister     History  Substance Use Topics  . Smoking status: Never Smoker   . Smokeless tobacco: Never Used  . Alcohol Use: No    OB History    Grav Para Term Preterm Abortions TAB SAB Ect Mult Living   2 2        2       Review of Systems  Constitutional: Negative for activity change.       All  ROS Neg except as noted in HPI  HENT: Negative for nosebleeds and neck pain.   Eyes: Negative for photophobia and discharge.  Respiratory: Negative for cough, shortness of breath and wheezing.   Cardiovascular: Negative for chest pain and palpitations.  Gastrointestinal: Positive for abdominal pain. Negative for blood in stool.  Genitourinary: Negative for dysuria, frequency and hematuria.  Musculoskeletal: Positive for myalgias. Negative for back pain and arthralgias.  Skin: Negative.   Neurological: Positive for dizziness and headaches. Negative for seizures and speech difficulty.  Psychiatric/Behavioral: Negative for hallucinations and confusion.    Allergies  Percocet; Promethazine; and Metoclopramide hcl  Home Medications   Current Outpatient Rx  Name Route Sig Dispense Refill  . ACETAMINOPHEN 500 MG PO TABS Oral Take 1,000 mg by mouth every 6 (six) hours as needed. Headache     . CEPHALEXIN 500 MG PO CAPS Oral Take 1 capsule (500 mg total) by mouth 4 (four) times daily. 40 capsule 0  . LORAZEPAM 0.5 MG PO TABS Oral Take 0.5 mg by mouth daily as needed. For anxiety    . NAPROXEN 250 MG PO TABS Oral Take 1 tablet (250 mg total) by mouth 2 (two) times daily with a meal. 14 tablet 0  . HYDROCODONE-ACETAMINOPHEN 5-325 MG PO TABS  1 or 2 tabs PO q6 hours prn pain 20 tablet 0    BP 138/88  Pulse 94  Temp 98.1 F (36.7 C) (Oral)  Resp 20  Ht 5\' 8"  (1.727 m)  Wt 220 lb (99.791 kg)  BMI 33.45 kg/m2  SpO2 100%  LMP 10/31/2011  Physical Exam  Nursing note and vitals reviewed. Constitutional: She is oriented to person, place, and time. She appears well-developed and well-nourished.  Non-toxic appearance.  HENT:  Head: Normocephalic.  Right Ear: Tympanic membrane and external ear normal.  Left Ear: Tympanic membrane and external ear normal.  Eyes: EOM and lids are normal. Pupils are equal, round, and reactive to light.  Neck: Normal range of motion. Neck supple. Carotid bruit is  not present.  Cardiovascular: Normal rate, regular rhythm, normal heart sounds, intact distal pulses and normal pulses.   Pulmonary/Chest: Breath sounds normal. No respiratory distress.       No noted hyperventilation.  Abdominal: Soft. Bowel sounds are normal. There is no tenderness. There is no guarding.  Musculoskeletal: Normal range of motion.  Lymphadenopathy:       Head (right side): No submandibular adenopathy present.       Head (left side): No submandibular adenopathy present.    She has no cervical adenopathy.  Neurological: She is alert and oriented to person, place, and time. She has normal strength. No cranial nerve deficit or sensory deficit. She exhibits normal muscle tone. Coordination normal.  Skin: Skin is warm and dry.  Psychiatric: Her speech is normal. Her mood appears anxious.    ED Course  Procedures (including critical care time)   Labs Reviewed  BASIC METABOLIC PANEL  MAGNESIUM   Ct Abdomen Pelvis Wo Contrast  11/14/2011  *RADIOLOGY REPORT*  Clinical Data: Left-sided flank pain.  History of kidney stones. Status post appendectomy and cholecystectomy.  CT ABDOMEN AND PELVIS WITHOUT CONTRAST  Technique:  Multidetector CT imaging of the abdomen and pelvis was performed following the standard protocol without intravenous contrast.  Comparison: CT of the abdomen and pelvis 01/06/2011.  Findings:  Lung Bases: Unremarkable.  Abdomen/Pelvis:  Status post cholecystectomy.  A 4 mm nonobstructive calculus in the lower pole collecting system of the right kidney.  2 mm nonobstructive calculus in the upper pole collecting system of the left kidney.  No additional calculi are noted along the course of either ureter or within the lumen of the urinary bladder.  No hydroureteronephrosis or perinephric stranding to suggest urinary tract obstruction at this time.  The unenhanced appearance of the liver, pancreas, spleen and bilateral adrenal glands is unremarkable.  Status post  appendectomy.  The uterus and ovaries are unremarkable in appearance.  There is no ascites or pneumoperitoneum and no pathologic distension of bowel.  No pathologic lymphadenopathy identified within the abdomen or pelvis on this noncontrast CT examination.  There are a few scattered colonic diverticula, without surrounding inflammatory changes to suggest an acute diverticulitis at this time.  Musculoskeletal: There are no aggressive appearing lytic or blastic lesions noted in the visualized portions of the skeleton.  IMPRESSION: 1.  No acute findings in the abdomen or pelvis to account for the patient's symptoms. 2.  There are tiny nonobstructive calculi within the collecting systems of the kidneys bilaterally, as above. 3.  Status post appendectomy and cholecystectomy. 4.  Mild colonic diverticulosis without findings to suggest acute diverticulitis.   Original Report Authenticated By: Florencia Reasons, M.D.      No diagnosis found.    MDM  I have reviewed nursing notes, vital signs, and all appropriate lab and imaging results for this patient. Complete blood count well within normal limits. Basic metabolic panel normal with the exception of the GFR being 73 (low). Magnesium within normal limits at 1.9. No orthostatic changes. Test results discussed with the patient and questions answered. Prescription for Flexeril one twice daily for spasm added to patient's current medications. Patient is to see her primary physician for recheck this week, she is to return to the emergency department if any changes, problems, or concerns.       Kathie Dike, PA 11/16/11 0013  Kathie Dike, PA 12/25/11 [phone]

## 2011-11-19 ENCOUNTER — Ambulatory Visit (HOSPITAL_COMMUNITY): Payer: Self-pay | Admitting: Psychology

## 2011-11-19 NOTE — ED Provider Notes (Signed)
Medical screening examination/treatment/procedure(s) were performed by non-physician practitioner and as supervising physician I was immediately available for consultation/collaboration.  Raeford Razor, MD 11/19/11 [phone]

## 2011-11-20 ENCOUNTER — Encounter (HOSPITAL_COMMUNITY): Payer: Self-pay | Admitting: Psychology

## 2011-11-20 ENCOUNTER — Ambulatory Visit (INDEPENDENT_AMBULATORY_CARE_PROVIDER_SITE_OTHER): Payer: BC Managed Care – PPO | Admitting: Psychology

## 2011-11-20 DIAGNOSIS — F4001 Agoraphobia with panic disorder: Secondary | ICD-10-CM

## 2011-11-20 DIAGNOSIS — F419 Anxiety disorder, unspecified: Secondary | ICD-10-CM

## 2011-11-20 DIAGNOSIS — F411 Generalized anxiety disorder: Secondary | ICD-10-CM

## 2011-11-20 NOTE — Progress Notes (Signed)
Patient:  [name]   DOB: 1975-11-28  MR Number: [id number]  Location: [facility] [address] [place] [address] Dept: [phone]  Start: 3 PM End: 4 PM  Provider/Observer:     Hershal Coria PSYD  Chief Complaint:      Chief Complaint  Patient presents with  . Anxiety  . Panic Attack  . Paranoid    Reason For Service:     The patient reports that she started having panic attacks during her pregnancy and that her son is now 35 months old. The patient reports that she is often getting them during her menstrual cycle and that not only does she have these panic attacks she also gets significant headaches. The patient reports that prior to her pregnancy she did not have problems with her menstrual cycle. The patient reports that she is major stressors right now have to do with her family situation. The patient reports that other stressors also the ability to trigger panic attacks various times. This is been going on for one year. The patient reports that when these events happen is very scary and she feels a the symptoms would never want go away and that she desperately wants her "old life back." The patient was treated for depression back in 2006 and treated with Luvox for OCD and symptoms of depression.  Interventions Strategy:  Cognitive/behavioral psychotherapeutic interventions  Participation Level:   Active  Participation Quality:  Appropriate      Behavioral Observation:  Well Groomed, Alert, and Appropriate.   Current Psychosocial Factors: The patient reports that her husband did not respond very well to the last visit that included the patient and her husband. She reports he became distressed and felt like all of the difficulties or been late on his shoulder went back has not been taking however, she reports that this is common for him to deal with things. It was not typically  surprising to her. The patient reports that she has been working on her anxiety and paranoia about this other woman but continues to be something that she hasn't obsessive/compulsive-like response 2. The patient reports that she still is not been able to logically convince himself that there was nothing to worry about this lady has made efforts to try to contact her through instagam.  Content of Session:   Reviewed current symptoms and continued work on therapeutic interventions for issues related to recurrent panic attacks.  Current Status:   The patient reports that she's been actively working on some of the initial therapeutic goals we stayed including learning diaphragmatic breathing and other relaxation techniques and better understanding (of her panic attacks..  Patient Progress:   Stable  Target Goals:   Target goals include reducing frequency, intensity, and duration of panic attacks and reducing the overall level of anxiety including feelings of tension, fear, and worry.  Last Reviewed:   11/20/2011  Goals Addressed Today:    Today we worked on issues related to therapeutic interventions for panic attacks.  Impression/Diagnosis:   The patient has a history of prior depression back in 2006 as well as OCD and other anxiety symptoms. When she was pregnant with her son who is now nearly 36-year-old she began developing panic attacks and these have persisted with her menstrual cycles primarily as well as headaches.  Diagnosis:    Axis I:  1. Panic disorder with agoraphobia   2. Anxiety  Axis II: No diagnosis

## 2011-11-26 ENCOUNTER — Ambulatory Visit (HOSPITAL_COMMUNITY): Payer: Self-pay | Admitting: Psychology

## 2011-12-02 ENCOUNTER — Encounter (HOSPITAL_COMMUNITY): Payer: Self-pay | Admitting: Psychology

## 2011-12-02 ENCOUNTER — Ambulatory Visit (INDEPENDENT_AMBULATORY_CARE_PROVIDER_SITE_OTHER): Payer: BC Managed Care – PPO | Admitting: Psychology

## 2011-12-02 DIAGNOSIS — F419 Anxiety disorder, unspecified: Secondary | ICD-10-CM

## 2011-12-02 DIAGNOSIS — F4001 Agoraphobia with panic disorder: Secondary | ICD-10-CM

## 2011-12-02 DIAGNOSIS — F411 Generalized anxiety disorder: Secondary | ICD-10-CM

## 2011-12-02 NOTE — Progress Notes (Signed)
Patient:  [name]   DOB: Aug 03, 1975  MR Number: [id number]  Location: [facility] [address] [place] [address] Dept: [phone]  Start: 1 PM  End: 2 PM  Provider/Observer:     Hershal Coria PSYD  Chief Complaint:      Chief Complaint  Patient presents with  . Anxiety  . Panic Attack    Reason For Service:     The patient reports that she started having panic attacks during her pregnancy and that her son is now 68 months old. The patient reports that she is often getting them during her menstrual cycle and that not only does she have these panic attacks she also gets significant headaches. The patient reports that prior to her pregnancy she did not have problems with her menstrual cycle. The patient reports that she is major stressors right now have to do with her family situation. The patient reports that other stressors also the ability to trigger panic attacks various times. This is been going on for one year. The patient reports that when these events happen is very scary and she feels a the symptoms would never want go away and that she desperately wants her "old life back." The patient was treated for depression back in 2006 and treated with Luvox for OCD and symptoms of depression.  Interventions Strategy:  Cognitive/behavioral psychotherapeutic interventions  Participation Level:   Active  Participation Quality:  Appropriate      Behavioral Observation:  Well Groomed, Alert, and Appropriate.   Current Psychosocial Factors: The patient reports that she had lunch with the other woman that she had had concerns in the past about her husband having an affair with. She reports that he went well and the other woman was trying to sell [employer] and household products and she is into the sales pitch. The patient reports that she was not able to openly come out and ask her  orders tell the other woman about her past worries and fears. The patient reports that she and her husband and continued to have some struggles and feels like he is simply not able to make her feel comfort and reassurance..  Content of Session:   Reviewed current symptoms and continued work on therapeutic interventions for issues related to recurrent panic attacks.  Current Status:   The patient reports that she's been actively working on some of the initial therapeutic goals we stayed including learning diaphragmatic breathing and other relaxation techniques and better understanding (of her panic attacks.. the patient does report that she has had a lot of obsessive-compulsive type symptoms lately.   Patient Progress:   Stable  Target Goals:   Target goals include reducing frequency, intensity, and duration of panic attacks and reducing the overall level of anxiety including feelings of tension, fear, and worry.  Last Reviewed:   12/02/2011  Goals Addressed Today:    Today we worked on issues related to therapeutic interventions for panic attacks.  Impression/Diagnosis:   The patient has a history of prior depression back in 2006 as well as OCD and other anxiety symptoms. When she was pregnant with her son who is now nearly 76-year-old she began developing panic attacks and these have persisted with her menstrual cycles primarily as well as headaches.  Diagnosis:    Axis I:  1. Panic disorder with agoraphobia   2. Anxiety         Axis II: No  diagnosis

## 2011-12-09 ENCOUNTER — Encounter (HOSPITAL_COMMUNITY): Payer: Self-pay | Admitting: Psychology

## 2011-12-09 ENCOUNTER — Ambulatory Visit (INDEPENDENT_AMBULATORY_CARE_PROVIDER_SITE_OTHER): Payer: BC Managed Care – PPO | Admitting: Psychology

## 2011-12-09 DIAGNOSIS — F4001 Agoraphobia with panic disorder: Secondary | ICD-10-CM

## 2011-12-09 DIAGNOSIS — F419 Anxiety disorder, unspecified: Secondary | ICD-10-CM

## 2011-12-09 DIAGNOSIS — F411 Generalized anxiety disorder: Secondary | ICD-10-CM

## 2011-12-09 NOTE — Progress Notes (Signed)
Patient:  [name]   DOB: [birth year]  MR Number: [id number]  Location: [facility] [address] [place] [address] Dept: [phone]  Start: 9 AM End: 10 AM  Provider/Observer:     Hershal Coria PSYD  Chief Complaint:      Chief Complaint  Patient presents with  . Anxiety  . Panic Attack    Reason For Service:     The patient reports that she started having panic attacks during her pregnancy and that her son is now 36 months old. The patient reports that she is often getting them during her menstrual cycle and that not only does she have these panic attacks she also gets significant headaches. The patient reports that prior to her pregnancy she did not have problems with her menstrual cycle. The patient reports that she is major stressors right now have to do with her family situation. The patient reports that other stressors also the ability to trigger panic attacks various times. This is been going on for one year. The patient reports that when these events happen is very scary and she feels a the symptoms would never want go away and that she desperately wants her "old life back." The patient was treated for depression back in 2006 and treated with Luvox for OCD and symptoms of depression.  Interventions Strategy:  Cognitive/behavioral psychotherapeutic interventions  Participation Level:   Active  Participation Quality:  Appropriate      Behavioral Observation:  Well Groomed, Alert, and Appropriate.   Current Psychosocial Factors: The patient reports that she and her husband were able to get away this past weekend and had a very good time as they went to [place] is the patient's husband had some brief work to do in and irritable able to spend some time together and go out on a "day." She reports that this went very well but on the way home she got an update on her phone  from face look where this other woman that she has been worried about post and her reply that she also was in [place]. The patient reports it does turn all kinds of feelings. She reports that she knows there was nothing going on this time between this other lady and her husband but is wondering if this lady is stalking her in some way. We talked about various issues with this but were able to talk about this from the standpoint that this may have nothing to do with actual connections with the patient is so unhappy with the overall status of her marriage and the fact that she needs much more than her husband is giving her which heights are worry and wondering why..  Content of Session:   Reviewed current symptoms and continued work on therapeutic interventions for issues related to recurrent panic attacks.  Current Status:   The patient reports an overall she's been doing much better but there has been a significant event with this other woman and the patient is confused about how to interpret. However, she reports that she is increasingly unhappy about her marriage and the fact that her husband as not showing any overt or clear desire for something as far as a relationship that is sufficient for her..   Patient Progress:   Stable  Target Goals:   Target goals include reducing frequency, intensity, and duration of panic attacks and reducing the overall level of anxiety including feelings of tension,  fear, and worry.  Last Reviewed:   12/08/2011  Goals Addressed Today:    Today we worked on issues related to therapeutic interventions for panic attacks.  Impression/Diagnosis:   The patient has a history of prior depression back in 2006 as well as OCD and other anxiety symptoms. When she was pregnant with her son who is now nearly 68-year-old she began developing panic attacks and these have persisted with her menstrual cycles primarily as well as headaches.  Diagnosis:    Axis I:  1. Panic disorder  with agoraphobia   2. Anxiety         Axis II: No diagnosis

## 2011-12-16 ENCOUNTER — Ambulatory Visit (HOSPITAL_COMMUNITY): Payer: Self-pay | Admitting: Psychology

## 2011-12-23 ENCOUNTER — Ambulatory Visit: Payer: Self-pay | Admitting: Gastroenterology

## 2011-12-23 ENCOUNTER — Telehealth: Payer: Self-pay | Admitting: Gastroenterology

## 2011-12-23 NOTE — Telephone Encounter (Signed)
Patient has h/o noncompliance.

## 2011-12-23 NOTE — Telephone Encounter (Signed)
Pt was a no show

## 2011-12-25 NOTE — ED Provider Notes (Signed)
Medical screening examination/treatment/procedure(s) were performed by non-physician practitioner and as supervising physician I was immediately available for consultation/collaboration.  Alexianna Nachreiner, MD 12/25/11 0858 

## 2012-01-13 ENCOUNTER — Ambulatory Visit (HOSPITAL_COMMUNITY): Payer: BC Managed Care – PPO | Admitting: Psychology

## 2012-01-13 ENCOUNTER — Encounter (HOSPITAL_COMMUNITY): Payer: Self-pay | Admitting: Psychology

## 2012-01-13 ENCOUNTER — Ambulatory Visit (INDEPENDENT_AMBULATORY_CARE_PROVIDER_SITE_OTHER): Payer: BC Managed Care – PPO | Admitting: Psychology

## 2012-01-13 DIAGNOSIS — F411 Generalized anxiety disorder: Secondary | ICD-10-CM

## 2012-01-13 DIAGNOSIS — F4001 Agoraphobia with panic disorder: Secondary | ICD-10-CM

## 2012-01-13 DIAGNOSIS — F419 Anxiety disorder, unspecified: Secondary | ICD-10-CM

## 2012-01-13 NOTE — Progress Notes (Signed)
Patient:  [name]   DOB: 1975-06-15  MR Number: [id number]  Location: [facility] [address] [place] [address] Dept: [phone]  Start: 11 AM End: 12 PM  Provider/Observer:     Hershal Coria PSYD  Chief Complaint:      Chief Complaint  Patient presents with  . Agitation  . Anxiety  . Depression  . Stress    Reason For Service:     The patient reports that she started having panic attacks during her pregnancy and that her son is now 58 months old. The patient reports that she is often getting them during her menstrual cycle and that not only does she have these panic attacks she also gets significant headaches. The patient reports that prior to her pregnancy she did not have problems with her menstrual cycle. The patient reports that she is major stressors right now have to do with her family situation. The patient reports that other stressors also the ability to trigger panic attacks various times. This is been going on for one year. The patient reports that when these events happen is very scary and she feels a the symptoms would never want go away and that she desperately wants her "old life back." The patient was treated for depression back in 2006 and treated with Luvox for OCD and symptoms of depression.  Interventions Strategy:  Cognitive/behavioral psychotherapeutic interventions  Participation Level:   Active  Participation Quality:  Appropriate      Behavioral Observation:  Well Groomed, Alert, and Appropriate.   Current Psychosocial Factors: The patient comes in today very upset about what is transpired since I saw her last. She reports that she just could not get over the feeling and worry that something was going on with her husband and wanted to be able to get confirmation that nothing was happen in as far as him messing around with other women. She had become  convinced that there was nothing going on between him and this woman that she be concerned about the past. However, she put a tracking on his son and monitored phone conversations and recorder was able to be turned on as well as a GPS tracking device. She started using this periodically and overheard a conversation between her husband and a Diplomatic Services operational officer at his work where there was some fairly intensive flirting going on with suggestive language. She heard what she thought centered like they were in a brace and chest as well. The patient is confronted her husband about this and other things that happened and her husband continues to deny anything happened but she continues to feel very upset about this. She agreed not to confront him over the next week or so severe to continue look at this therapeutic standpoint.  Content of Session:   Reviewed current symptoms and continued work on therapeutic interventions for issues related to recurrent panic attacks.  Current Status:   The patient had been doing much better until recently when she put a tracking On her husband's phone and was able to record conversations and tracking with a GPS device.  Patient Progress:   Stable  Target Goals:   Target goals include reducing frequency, intensity, and duration of panic attacks and reducing the overall level of anxiety including feelings of tension, fear, and worry.  Last Reviewed:   01/13/2012  Goals Addressed Today:    Today we worked on issues related to therapeutic interventions for  panic attacks.  Impression/Diagnosis:   The patient has a history of prior depression back in 2006 as well as OCD and other anxiety symptoms. When she was pregnant with her son who is now nearly 74-year-old she began developing panic attacks and these have persisted with her menstrual cycles primarily as well as headaches.  Diagnosis:    Axis I:  1. Panic disorder with agoraphobia   2. Anxiety         Axis II: No diagnosis

## 2012-01-15 ENCOUNTER — Encounter (HOSPITAL_COMMUNITY): Payer: Self-pay | Admitting: Psychology

## 2012-01-15 NOTE — Progress Notes (Signed)
Patient:  [name]   DOB: 1975/07/24  MR Number: [id number]  Location: BEHAVIORAL [facility] [address] [place] [address] Dept: [phone]  Start: 11 AM End: 12 PM  Provider/Observer:     Hershal Coria PSYD  Chief Complaint:      Chief Complaint  Patient presents with  . Anxiety  . Panic Attack    Reason For Service:     The patient reports that she started having panic attacks during her pregnancy and that her son is now 57 months old. The patient reports that she is often getting them during her menstrual cycle and that not only does she have these panic attacks she also gets significant headaches. The patient reports that prior to her pregnancy she did not have problems with her menstrual cycle. The patient reports that she is major stressors right now have to do with her family situation. The patient reports that other stressors also the ability to trigger panic attacks various times. This is been going on for one year. The patient reports that when these events happen is very scary and she feels a the symptoms would never want go away and that she desperately wants her "old life back." The patient was treated for depression back in 2006 and treated with Luvox for OCD and symptoms of depression.  Interventions Strategy:  Cognitive/behavioral psychotherapeutic interventions  Participation Level:   Active  Participation Quality:  Appropriate      Behavioral Observation:  Well Groomed, Alert, and Appropriate.   Current Psychosocial Factors: The patient comes in today very upset about what is transpired since I saw her last. She reports that she just could not get over the feeling and worry that something was going on with her husband and wanted to be able to get confirmation that nothing was happen in as far as him messing around with other women. She had become convinced that there was  nothing going on between him and this woman that she be concerned about the past. However, she put a tracking on his son and monitored phone conversations and recorder was able to be turned on as well as a GPS tracking device. She started using this periodically and overheard a conversation between her husband and a Diplomatic Services operational officer at his work where there was some fairly intensive flirting going on with suggestive language. She heard what she thought centered like they were in a brace and chest as well. The patient is confronted her husband about this and other things that happened and her husband continues to deny anything happened but she continues to feel very upset about this. She agreed not to confront him over the next week or so severe to continue look at this therapeutic standpoint.  Content of Session:   Reviewed current symptoms and continued work on therapeutic interventions for issues related to recurrent panic attacks.  Current Status:   The patient had been doing much better until recently when she put a tracking On her husband's phone and was able to record conversations and tracking with a GPS device.  Patient Progress:   Stable  Target Goals:   Target goals include reducing frequency, intensity, and duration of panic attacks and reducing the overall level of anxiety including feelings of tension, fear, and worry.  Last Reviewed:   01/13/2012  Goals Addressed Today:    Today we worked on issues related to therapeutic interventions for panic attacks.  Impression/Diagnosis:  The patient has a history of prior depression back in 2006 as well as OCD and other anxiety symptoms. When she was pregnant with her son who is now nearly 33-year-old she began developing panic attacks and these have persisted with her menstrual cycles primarily as well as headaches.  Diagnosis:    Axis I:  1. Panic disorder with agoraphobia   2. Anxiety         Axis II: No diagnosis

## 2012-01-18 ENCOUNTER — Ambulatory Visit (HOSPITAL_COMMUNITY): Payer: Self-pay | Admitting: Psychology

## 2012-01-22 ENCOUNTER — Ambulatory Visit (INDEPENDENT_AMBULATORY_CARE_PROVIDER_SITE_OTHER): Payer: BC Managed Care – PPO | Admitting: Psychology

## 2012-01-22 DIAGNOSIS — F411 Generalized anxiety disorder: Secondary | ICD-10-CM

## 2012-01-22 DIAGNOSIS — F4001 Agoraphobia with panic disorder: Secondary | ICD-10-CM

## 2012-01-22 DIAGNOSIS — F419 Anxiety disorder, unspecified: Secondary | ICD-10-CM

## 2012-01-28 ENCOUNTER — Ambulatory Visit (HOSPITAL_COMMUNITY): Payer: Self-pay | Admitting: Psychology

## 2012-02-02 ENCOUNTER — Other Ambulatory Visit (HOSPITAL_COMMUNITY): Payer: Self-pay | Admitting: Family Medicine

## 2012-02-02 ENCOUNTER — Other Ambulatory Visit (HOSPITAL_COMMUNITY): Payer: Self-pay | Admitting: Internal Medicine

## 2012-02-02 DIAGNOSIS — N63 Unspecified lump in unspecified breast: Secondary | ICD-10-CM

## 2012-02-09 ENCOUNTER — Encounter (HOSPITAL_COMMUNITY): Payer: Self-pay | Admitting: Psychology

## 2012-02-09 NOTE — Progress Notes (Signed)
Patient:  [name]   DOB: 09/19/1975  MR Number: [id number]  Location: BEHAVIORAL [facility] [address] [place] [address] Dept: [phone]  Start: 11 AM End: 12 PM  Provider/Observer:     Hershal Coria PSYD  Chief Complaint:      Chief Complaint  Patient presents with  . Panic Attack  . Anxiety    Reason For Service:     The patient reports that she started having panic attacks during her pregnancy and that her son is now 66 months old. The patient reports that she is often getting them during her menstrual cycle and that not only does she have these panic attacks she also gets significant headaches. The patient reports that prior to her pregnancy she did not have problems with her menstrual cycle. The patient reports that she is major stressors right now have to do with her family situation. The patient reports that other stressors also the ability to trigger panic attacks various times. This is been going on for one year. The patient reports that when these events happen is very scary and she feels a the symptoms would never want go away and that she desperately wants her "old life back." The patient was treated for depression back in 2006 and treated with Luvox for OCD and symptoms of depression.  Interventions Strategy:  Cognitive/behavioral psychotherapeutic interventions  Participation Level:   Active  Participation Quality:  Appropriate      Behavioral Observation:  Well Groomed, Alert, and Appropriate.   Current Psychosocial Factors: The patient is working on how to deal with issues with husband and her concerns about him with her anxiety and panic.    Content of Session:   Reviewed current symptoms and continued work on therapeutic interventions for issues related to recurrent panic attacks.  Current Status:   The patient had been doing much better until recently when she put a  tracking On her husband's phone and was able to record conversations and tracking with a GPS device.  Patient Progress:   Stable  Target Goals:   Target goals include reducing frequency, intensity, and duration of panic attacks and reducing the overall level of anxiety including feelings of tension, fear, and worry.  Last Reviewed:   02/21/2012  Goals Addressed Today:    Today we worked on issues related to therapeutic interventions for panic attacks.  Impression/Diagnosis:   The patient has a history of prior depression back in 2006 as well as OCD and other anxiety symptoms. When she was pregnant with her son who is now nearly 80-year-old she began developing panic attacks and these have persisted with her menstrual cycles primarily as well as headaches.  Diagnosis:    Axis I:  1. Panic disorder with agoraphobia   2. Anxiety         Axis II: No diagnosis

## 2012-02-14 ENCOUNTER — Encounter (HOSPITAL_COMMUNITY): Payer: Self-pay | Admitting: *Deleted

## 2012-02-14 ENCOUNTER — Emergency Department (HOSPITAL_COMMUNITY)
Admission: EM | Admit: 2012-02-14 | Discharge: 2012-02-14 | Disposition: A | Discharge status: Home / Self Care | Payer: BC Managed Care – PPO | Attending: Emergency Medicine | Admitting: Emergency Medicine

## 2012-02-14 ENCOUNTER — Emergency Department (HOSPITAL_COMMUNITY): Payer: BC Managed Care – PPO

## 2012-02-14 DIAGNOSIS — R110 Nausea: Secondary | ICD-10-CM | POA: Insufficient documentation

## 2012-02-14 DIAGNOSIS — Z3202 Encounter for pregnancy test, result negative: Secondary | ICD-10-CM | POA: Insufficient documentation

## 2012-02-14 DIAGNOSIS — R197 Diarrhea, unspecified: Secondary | ICD-10-CM | POA: Insufficient documentation

## 2012-02-14 DIAGNOSIS — M545 Low back pain, unspecified: Secondary | ICD-10-CM | POA: Insufficient documentation

## 2012-02-14 DIAGNOSIS — Z8719 Personal history of other diseases of the digestive system: Secondary | ICD-10-CM | POA: Insufficient documentation

## 2012-02-14 DIAGNOSIS — N898 Other specified noninflammatory disorders of vagina: Secondary | ICD-10-CM | POA: Insufficient documentation

## 2012-02-14 DIAGNOSIS — Z79899 Other long term (current) drug therapy: Secondary | ICD-10-CM | POA: Insufficient documentation

## 2012-02-14 DIAGNOSIS — R51 Headache: Secondary | ICD-10-CM | POA: Insufficient documentation

## 2012-02-14 DIAGNOSIS — F319 Bipolar disorder, unspecified: Secondary | ICD-10-CM | POA: Insufficient documentation

## 2012-02-14 DIAGNOSIS — K219 Gastro-esophageal reflux disease without esophagitis: Secondary | ICD-10-CM | POA: Insufficient documentation

## 2012-02-14 DIAGNOSIS — Z87442 Personal history of urinary calculi: Secondary | ICD-10-CM | POA: Insufficient documentation

## 2012-02-14 DIAGNOSIS — K2970 Gastritis, unspecified, without bleeding: Secondary | ICD-10-CM

## 2012-02-14 LAB — CBC
HCT: 42.1 % (ref 36.0–46.0)
MCHC: 34 g/dL (ref 30.0–36.0)
Platelets: 220 10*3/uL (ref 150–400)
RDW: 12.8 % (ref 11.5–15.5)
WBC: 4.4 10*3/uL (ref 4.0–10.5)

## 2012-02-14 LAB — COMPREHENSIVE METABOLIC PANEL
AST: 14 U/L (ref 0–37)
Albumin: 4 g/dL (ref 3.5–5.2)
Alkaline Phosphatase: 74 U/L (ref 39–117)
BUN: 11 mg/dL (ref 6–23)
Chloride: 102 mEq/L (ref 96–112)
Potassium: 3.7 mEq/L (ref 3.5–5.1)
Sodium: 136 mEq/L (ref 135–145)
Total Bilirubin: 0.4 mg/dL (ref 0.3–1.2)
Total Protein: 7.3 g/dL (ref 6.0–8.3)

## 2012-02-14 LAB — URINALYSIS, ROUTINE W REFLEX MICROSCOPIC
Leukocytes, UA: NEGATIVE
Nitrite: NEGATIVE
Protein, ur: NEGATIVE mg/dL
Specific Gravity, Urine: 1.025 (ref 1.005–1.030)
Urobilinogen, UA: 0.2 mg/dL (ref 0.0–1.0)

## 2012-02-14 LAB — LIPASE, BLOOD: Lipase: 15 U/L (ref 11–59)

## 2012-02-14 LAB — URINE MICROSCOPIC-ADD ON

## 2012-02-14 LAB — PREGNANCY, URINE: Preg Test, Ur: NEGATIVE

## 2012-02-14 MED ORDER — PANTOPRAZOLE SODIUM 40 MG PO TBEC
40.0000 mg | DELAYED_RELEASE_TABLET | Freq: Once | ORAL | Status: AC
  Administered 2012-02-14: 40 mg via ORAL
  Filled 2012-02-14: qty 1

## 2012-02-14 MED ORDER — PROMETHAZINE HCL 25 MG/ML IJ SOLN
INTRAMUSCULAR | Status: AC
  Filled 2012-02-14: qty 1

## 2012-02-14 MED ORDER — ONDANSETRON HCL 4 MG/2ML IJ SOLN
4.0000 mg | Freq: Once | INTRAMUSCULAR | Status: DC
  Filled 2012-02-14: qty 2

## 2012-02-14 MED ORDER — PROMETHAZINE HCL 25 MG/ML IJ SOLN
25.0000 mg | Freq: Once | INTRAMUSCULAR | Status: AC
  Administered 2012-02-14: 25 mg via INTRAVENOUS

## 2012-02-14 MED ORDER — HYDROCODONE-ACETAMINOPHEN 5-325 MG PO TABS: 1.0000 | ORAL_TABLET | Freq: Four times a day (QID) | ORAL | Status: AC | PRN

## 2012-02-14 MED ORDER — PANTOPRAZOLE SODIUM 20 MG PO TBEC: 40.0000 mg | DELAYED_RELEASE_TABLET | Freq: Every day | ORAL | Status: DC

## 2012-02-14 MED ORDER — PROMETHAZINE HCL 25 MG RE SUPP: 25.0000 mg | Freq: Four times a day (QID) | RECTAL | Status: DC | PRN

## 2012-02-14 MED ORDER — GI COCKTAIL ~~LOC~~
30.0000 mL | Freq: Once | ORAL | Status: AC
  Administered 2012-02-14: 30 mL via ORAL
  Filled 2012-02-14: qty 30

## 2012-02-14 MED ORDER — HYDROMORPHONE HCL PF 1 MG/ML IJ SOLN
1.0000 mg | Freq: Once | INTRAMUSCULAR | Status: AC
  Administered 2012-02-14: 1 mg via INTRAVENOUS
  Filled 2012-02-14: qty 1

## 2012-02-14 NOTE — ED Notes (Signed)
Pt states abdominal pain, described as burning sensation. Pain is worse after eating. Pain also to lower back. Nausea at times. Denies vomiting or diarrhea. Had one loose stool yesterday

## 2012-02-14 NOTE — ED Notes (Signed)
Pt is sleeping

## 2012-02-14 NOTE — ED Notes (Signed)
Upper abd pain and lower back pain since yesterday.  C/o nausea, denies v/d.

## 2012-02-14 NOTE — ED Provider Notes (Signed)
History     CSN: [id number]  Arrival date & time 02/14/12  7829   First MD Initiated Contact with Patient 02/14/12 [phone]      Chief Complaint  Patient presents with  . Abdominal Pain  . Back Pain    (Consider location/radiation/quality/duration/timing/severity/associated sxs/prior treatment) HPI Comments: Onset of L lower back pain yest AM, shortly followed by mid abdominal pain that has persisted until the present.  "worse after eating".  Describes as "burning".  Nauseous but no vomiting.  Minimal watery stool.  Taking no meds for sxs.  Denies fever or chills.  No UTI sxs.  Nearly finished with menses.  S? Cholecystectomy and appendectomy.  Pt of dr. Phillips Odor, but states she in the process of switching to dr. Gerda Diss.  No h/o ulcers or other GI disorders.  The history is provided by the patient. No language interpreter was used.    Past Medical History  Diagnosis Date  . Kidney stones   . Abdominal pain   . Nausea & vomiting   . Rectal bleeding   . Dizziness   . Syncope   . Generalized headaches   . Sinus problem   . GERD (gastroesophageal reflux disease)   . Bipolar disorder     patient states she was misdiagnosed, has not been on meds in years    Past Surgical History  Procedure Date  . Appendectomy   . Dilation and curettage of uterus   . Cholecystectomy 12/2010    Family History  Problem Relation Age of Onset  . Hypertension Mother   . Hypertension Father   . Ulcerative colitis Maternal Aunt     UC, age 78. Colostomy for severe UC. Liver transplant mid-20s (some sort of complication/Staph infection. 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   . Irritable bowel syndrome Sister     History  Substance Use Topics  . Smoking status: Never Smoker   . Smokeless tobacco: Never Used  . Alcohol Use: No    OB History    Grav Para Term Preterm Abortions TAB SAB Ect Mult Living   2 2        2       Review of Systems    Constitutional: Negative for fever and chills.  Gastrointestinal: Positive for nausea, abdominal pain and diarrhea. Negative for vomiting and blood in stool.  Genitourinary: Positive for vaginal bleeding. Negative for dysuria, urgency, frequency, hematuria, decreased urine volume, vaginal discharge, vaginal pain and pelvic pain.  Musculoskeletal: Positive for back pain.  All other systems reviewed and are negative.    Allergies  Percocet; Promethazine; and Metoclopramide hcl  Home Medications   Current Outpatient Rx  Name  Route  Sig  Dispense  Refill  . ACETAMINOPHEN 500 MG PO TABS   Oral   Take 1,000 mg by mouth every 6 (six) hours as needed. Headache          . LORAZEPAM 0.5 MG PO TABS   Oral   Take 0.5 mg by mouth daily as needed. For anxiety         . HYDROCODONE-ACETAMINOPHEN 5-325 MG PO TABS   Oral   Take 1 tablet by mouth every 6 (six) hours as needed for pain.   10 tablet   0   . PANTOPRAZOLE SODIUM 20 MG PO TBEC   Oral   Take 2 tablets (40 mg total) by mouth daily.   10 tablet   0   . PROMETHAZINE HCL 25 MG RE  SUPP   Rectal   Place 1 suppository (25 mg total) rectally every 6 (six) hours as needed for nausea.   12 each   0     BP 97/57  Pulse 72  Temp 98.6 F (37 C) (Oral)  Resp 16  Ht 5\' 7"  (1.702 m)  Wt 221 lb (100.245 kg)  BMI 34.61 kg/m2  SpO2 98%  LMP 02/14/2012  Physical Exam  Nursing note and vitals reviewed. Constitutional: She is oriented to person, place, and time. She appears well-developed and well-nourished. No distress.  HENT:  Head: Normocephalic and atraumatic.  Eyes: EOM are normal.  Neck: Normal range of motion.  Cardiovascular: Normal rate, regular rhythm and normal heart sounds.   Pulmonary/Chest: Effort normal and breath sounds normal.  Abdominal: Soft. She exhibits no distension and no mass. Bowel sounds are increased. There is tenderness in the periumbilical area. There is no rebound, no guarding and no CVA  tenderness.    Musculoskeletal:       Lumbar back: She exhibits decreased range of motion and pain. She exhibits no tenderness, no bony tenderness and no swelling.       Back:  Neurological: She is alert and oriented to person, place, and time.  Skin: Skin is warm and dry.  Psychiatric: She has a normal mood and affect. Judgment normal.    ED Course  Procedures (including critical care time)  Labs Reviewed  URINALYSIS, ROUTINE W REFLEX MICROSCOPIC - Abnormal; Notable for the following:    Hgb urine dipstick SMALL (*)     All other components within normal limits  URINE MICROSCOPIC-ADD ON - Abnormal; Notable for the following:    Squamous Epithelial / LPF FEW (*)     Crystals CA OXALATE CRYSTALS (*)     All other components within normal limits  PREGNANCY, URINE  CBC  COMPREHENSIVE METABOLIC PANEL  LIPASE, BLOOD   Dg Abd Acute W/chest  02/14/2012  *RADIOLOGY REPORT*  Clinical Data: Abdominal pain  ACUTE ABDOMEN SERIES (ABDOMEN 2 VIEW & CHEST 1 VIEW)  Comparison: 11/30/2011  Findings: Cardiomediastinal silhouette is unremarkable.  No acute infiltrate or pleural effusion.  No pulmonary edema.  There is nonspecific nonobstructive bowel gas pattern.  No free abdominal air.  Post cholecystectomy surgical clips are noted.  IMPRESSION: No acute disease.  Nonspecific nonobstructive bowel gas pattern. No free abdominal air.   Original Report Authenticated By: Natasha Mead, M.D.      1. Gastritis       MDM  Hydrocodone, 10 rx-protonix 40 mg, 10 rx-phenergan supp 25 mg, 12 F/u with dr. Phillips Odor.        Evalina Field, [place] 02/14/12 1255

## 2012-02-14 NOTE — ED Notes (Signed)
Pt returned from xray dept. 

## 2012-02-14 NOTE — ED Provider Notes (Signed)
Medical screening examination/treatment/procedure(s) were performed by non-physician practitioner and as supervising physician I was immediately available for consultation/collaboration.   Carleene Cooper III, MD 02/14/12 [phone]

## 2012-02-14 NOTE — ED Notes (Signed)
Pt had refused zofran stating "it does not work" then requested Phenergan. Order obtained for phenergan. Pt states dilaudid made her abdominal pain worse. NAD at this time.

## 2012-02-17 ENCOUNTER — Ambulatory Visit (HOSPITAL_COMMUNITY)
Admission: RE | Admit: 2012-02-17 | Discharge: 2012-02-17 | Disposition: A | Discharge status: Home / Self Care | Payer: BC Managed Care – PPO | Source: Ambulatory Visit | Attending: Internal Medicine | Admitting: Internal Medicine

## 2012-02-17 ENCOUNTER — Ambulatory Visit: Payer: Self-pay | Admitting: Gastroenterology

## 2012-02-17 DIAGNOSIS — N63 Unspecified lump in unspecified breast: Secondary | ICD-10-CM | POA: Insufficient documentation

## 2012-02-23 ENCOUNTER — Telehealth: Payer: Self-pay | Admitting: *Deleted

## 2012-02-23 ENCOUNTER — Encounter (HOSPITAL_COMMUNITY): Payer: Self-pay | Admitting: Pharmacy Technician

## 2012-02-23 ENCOUNTER — Ambulatory Visit (INDEPENDENT_AMBULATORY_CARE_PROVIDER_SITE_OTHER): Payer: BC Managed Care – PPO | Admitting: Gastroenterology

## 2012-02-23 ENCOUNTER — Encounter: Payer: Self-pay | Admitting: Gastroenterology

## 2012-02-23 VITALS — BP 118/82 | HR 90 | Temp 98.5°F | Ht 67.0 in | Wt 224.2 lb

## 2012-02-23 DIAGNOSIS — R197 Diarrhea, unspecified: Secondary | ICD-10-CM

## 2012-02-23 DIAGNOSIS — R1013 Epigastric pain: Secondary | ICD-10-CM

## 2012-02-23 LAB — CBC WITH DIFFERENTIAL/PLATELET
Basophils Absolute: 0 10*3/uL (ref 0.0–0.1)
Basophils Relative: 0 % (ref 0–1)
Hemoglobin: 14.2 g/dL (ref 12.0–15.0)
Lymphocytes Relative: 30 % (ref 12–46)
MCHC: 33.4 g/dL (ref 30.0–36.0)
Monocytes Relative: 6 % (ref 3–12)
Neutro Abs: 3.3 10*3/uL (ref 1.7–7.7)
Neutrophils Relative %: 64 % (ref 43–77)
RDW: 12.8 % (ref 11.5–15.5)
WBC: 5.2 10*3/uL (ref 4.0–10.5)

## 2012-02-23 LAB — BASIC METABOLIC PANEL
CO2: 30 mEq/L (ref 19–32)
Chloride: 103 mEq/L (ref 96–112)
Sodium: 141 mEq/L (ref 135–145)

## 2012-02-23 LAB — LIPASE: Lipase: 19 U/L (ref 11–59)

## 2012-02-23 LAB — HEPATIC FUNCTION PANEL
Bilirubin, Direct: <0.1 mg/dL (ref 0.0–0.3)
Indirect Bilirubin: 0.3 mg/dL (ref 0.0–0.9)
Total Bilirubin: 0.3 mg/dL (ref 0.3–1.2)

## 2012-02-23 MED ORDER — ESOMEPRAZOLE MAGNESIUM 40 MG PO CPDR: 40.0000 mg | DELAYED_RELEASE_CAPSULE | Freq: Two times a day (BID) | ORAL | Status: DC

## 2012-02-23 NOTE — Progress Notes (Signed)
Quick Note:  Normal CBC, HFP, BMP, Lipase. Proceed with EGD. Pt states she completed stool studies the day of the visit. ______

## 2012-02-23 NOTE — Telephone Encounter (Signed)
[name] called to find out the results of her stat labs that were done today. The results are not in, however, I wanted to make a note that she is eager for these results. Thanks.

## 2012-02-23 NOTE — Patient Instructions (Addendum)
Please have blood work done today. We will call you as soon as this is available.  Please complete stool studies.  Start taking Nexium twice a day. Take the Phenergan as needed for nausea. Avoid solid foods for now; follow a clear liquid diet, broth. Stay hydrated drinking sips of fluids. Monitor for darkening of urine, dry mouth, dizziness, extreme fatigue, worsening of pain.  We have set you up for an upper endoscopy in the future. If anything changes after results of your bloodwork that would point in another direction, we will cancel this.   Further recommendations in near future. You may take Kaopectate for diarrhea.

## 2012-02-23 NOTE — Progress Notes (Signed)
Referring Provider: Merlyn Albert, MD Primary Care Physician:  Harlow Asa, MD Primary GI: Dr. Jena Gauss   Chief Complaint  Patient presents with  . Abdominal Pain    HPI:   36 year old female with hx of non-compliance, actually scheduled for TCS/EGD with possible small bowel biopsies at this time last year. However, she cancelled and rescheduled multiple times. Hx of chronic diarrhea, abdominal pain, rectal bleeding, maternal aunt with history of UC and PSC possibly. Negative celiac panel in 2009. Also hx of N/V, elevated transaminases with thus far negative viral markers, negative hemochromatosis, Wilson's disease, working diagnosis of fatty liver.   Notes this pain is different. Notes epigastric and LUQ pain, started this weekend. Sharp pain. Stayed in bed Sunday. Notes oily, dark loose stools. When eating/drinking feels like fire, like she is going to explode. Feels like going to vomit but doesn't. Took Protonix, which did not help. She has only been on this for about a week. States Tums makes it worse. No melena. Took Ativan, which she has been given prn for anxiety. Then shoots through to back. Notes as burning, stabbing. Worsened since yesterday evening. Noted as burning epigastric pain. Denies NSAID use.   Still having loose stools. Notes loose stools after eating. "eating impossible" the past few days. Eating worsens the abdominal pain. Prior to this episode, was having a BM once per day, which was productive and well-formed. Husband recently had food poisoning from [employer] [place]; she had taken a bite of chicken. Presented to the ED on 12/8 with these symptoms. Mom has hx of Cdiff, last year. No problems since that time. No abx recently, but she did have abx a few months ago for sinus infection. No fever or chills. Felt like heartrate was up high last night. States +abdominal bloating. Urine clear.   No rectal bleeding currently, states rectum burns when wiping.   Lab Results  Component  Value Date   WBC 4.4 02/14/2012   HGB 14.3 02/14/2012   HCT 42.1 02/14/2012   MCV 84.7 02/14/2012   PLT 220 02/14/2012   Lab Results  Component Value Date   ALT 16 02/14/2012   AST 14 02/14/2012   ALKPHOS 74 02/14/2012   BILITOT 0.4 02/14/2012      Past Medical History  Diagnosis Date  . Kidney stones   . Abdominal pain   . Nausea & vomiting   . Rectal bleeding   . Dizziness   . Syncope   . Generalized headaches   . Sinus problem   . GERD (gastroesophageal reflux disease)   . Anxiety     in past, records note bipolar disorder; however, states this was misdiagnosed    Past Surgical History  Procedure Date  . Appendectomy   . Dilation and curettage of uterus   . Cholecystectomy 12/2010    Current Outpatient Prescriptions  Medication Sig Dispense Refill  . acetaminophen (TYLENOL) 500 MG tablet Take 1,000 mg by mouth every 6 (six) hours as needed. Headache       . LORazepam (ATIVAN) 0.5 MG tablet Take 0.5 mg by mouth daily as needed. For anxiety      . pantoprazole (PROTONIX) 20 MG tablet Take 40 mg by mouth daily. Takes one tablet daily      . HYDROcodone-acetaminophen (NORCO/VICODIN) 5-325 MG per tablet Take 1 tablet by mouth every 6 (six) hours as needed for pain.  10 tablet  0  . promethazine (PHENERGAN) 25 MG suppository Place 1 suppository (25 mg total) rectally  every 6 (six) hours as needed for nausea.  12 each  0    Allergies as of 02/23/2012 - Review Complete 02/23/2012  Allergen Reaction Noted  . Percocet (oxycodone-acetaminophen) Nausea And Vomiting 12/06/2010  . Promethazine Other (See Comments) 11/05/2010  . Metoclopramide hcl Palpitations 02/16/2011    Family History  Problem Relation Age of Onset  . Hypertension Mother   . Hypertension Father   . Ulcerative colitis Maternal Aunt     UC, age 50. Colostomy for severe UC. Liver transplant mid-20s (some sort of complication/Staph infection. H/O Primary Sclerosing Cholangitis. Kidney transplant related to her  medications.   . Irritable bowel syndrome Sister   . Colon cancer Neg Hx     History   Social History  . Marital Status: Married    Spouse Name: N/A    Number of Children: 2  . Years of Education: N/A   Occupational History  .     Social History Main Topics  . Smoking status: Never Smoker   . Smokeless tobacco: Never Used  . Alcohol Use: No  . Drug Use: No  . Sexually Active: Yes    Birth Control/ Protection: None     Comment: LMP Feb 15, 2012.    Other Topics Concern  . None   Social History Narrative   Two children age 44, age 36 months.    Review of Systems: SEE HPI  Physical Exam: BP 118/82  Pulse 90  Temp 98.5 F (36.9 C) (Oral)  Ht 5\' 7"  (1.702 m)  Wt 224 lb 3.2 oz (101.696 kg)  BMI 35.11 kg/m2  LMP 02/15/2012 General:   Alert and oriented. Tearful at times.  Head:  Normocephalic and atraumatic. Eyes:  Conjuctiva clear without scleral icterus. Mouth:  Oral mucosa pink and moist. Good dentition. No lesions. Neck:  Supple, without mass or thyromegaly. Heart:  S1, S2 present without murmurs, rubs, or gallops. Regular rate and rhythm. Abdomen:  +BS, soft, non-tender and non-distended. No rebound or guarding. No HSM or masses noted. Msk:  Symmetrical without gross deformities. Normal posture. Extremities:  Without edema. Neurologic:  Alert and  oriented x4;  grossly normal neurologically. Skin:  Intact without significant lesions or rashes. Cervical Nodes:  No significant cervical adenopathy. Psych:  Alert and cooperative. Normal mood and affect.

## 2012-02-24 ENCOUNTER — Ambulatory Visit (HOSPITAL_COMMUNITY): Payer: Self-pay | Admitting: Psychology

## 2012-02-24 LAB — GIARDIA ANTIGEN: Giardia Screen (EIA): NEGATIVE

## 2012-02-24 LAB — FECAL LACTOFERRIN, QUANT: Lactoferrin: NEGATIVE

## 2012-02-24 NOTE — Telephone Encounter (Signed)
AS spoke with pt.

## 2012-02-25 ENCOUNTER — Telehealth: Payer: Self-pay | Admitting: Gastroenterology

## 2012-02-25 ENCOUNTER — Ambulatory Visit (HOSPITAL_COMMUNITY)
Admission: RE | Admit: 2012-02-25 | Payer: BC Managed Care – PPO | Source: Ambulatory Visit | Admitting: Internal Medicine

## 2012-02-25 ENCOUNTER — Encounter (HOSPITAL_COMMUNITY): Admission: RE | Payer: Self-pay | Source: Ambulatory Visit

## 2012-02-25 DIAGNOSIS — R1013 Epigastric pain: Secondary | ICD-10-CM | POA: Insufficient documentation

## 2012-02-25 SURGERY — EGD (ESOPHAGOGASTRODUODENOSCOPY)
Anesthesia: Moderate Sedation

## 2012-02-25 NOTE — Progress Notes (Signed)
Faxed to PCP

## 2012-02-25 NOTE — Telephone Encounter (Signed)
fyi

## 2012-02-25 NOTE — Assessment & Plan Note (Signed)
Hx of chronic diarrhea, abdominal pain, rectal bleeding, maternal aunt with history of UC and PSC possibly. Negative celiac panel in 2009. Notes in interim from last visit a year ago, bowel habits normalized. With recent food poisoning of husband, I question this as the culprit. She has had abx in the recent past. Will check stool studies, hold off on TCS for now although she needs this in the future. I do not think she could tolerate the prep due to acute epigastric discomfort.  TCS after current symptoms assessed via EGD. See epigastric pain.

## 2012-02-25 NOTE — Telephone Encounter (Signed)
Patient called the hospital yesterday 12/18 and canceled her procedure this morning with Dr. Vonzella Nipple said she never gave a reason why and Ive tried to call her I [facility] for her to call me back to follow up to see if I can get her R/S.

## 2012-02-25 NOTE — Telephone Encounter (Signed)
Spoke with pt- she stated she was feeling somewhat better now. She is eating yogurt and taking the nexium. She said she had something come up with her family and didn't have a babysitter this morning and couldn't do her procedure. Ok to set up ov in 3-4 weeks.  Darl Pikes, please schedule.

## 2012-02-25 NOTE — Telephone Encounter (Signed)
Noted. Thanks.

## 2012-02-25 NOTE — Telephone Encounter (Signed)
Noted.  Needs to come back in to see [place] in a few weeks. I'm assuming her symptoms improved, but let's get a PR.  Raynelle Fanning, stool studies negative. Needs OV in 3-4 weeks to discuss colonoscopy +/- EGD.

## 2012-02-25 NOTE — Assessment & Plan Note (Signed)
36 year old female with hx of abdominal pain, N/V, now with acute onset of epigastric and LUQ pain this weekend. Worsened with eating/drinking. +nausea but not vomiting. No improvement with Protonix. No melena. Denies NSAIDs. Due to recent food poisoning, I do question this as the culprit. However, unable to rule out gastritis, PUD. Doubt biliary or pancreatic process. Gallbladder no longer present. Check CBC, BMP, HFP, lipase. EGD in near future.   Proceed with upper endoscopy in the near future with Dr. Jena Gauss. The risks, benefits, and alternatives have been discussed in detail with patient. They have stated understanding and desire to proceed.  Bland diet in interim Nexium BID

## 2012-02-26 ENCOUNTER — Ambulatory Visit (INDEPENDENT_AMBULATORY_CARE_PROVIDER_SITE_OTHER): Payer: BC Managed Care – PPO | Admitting: Psychology

## 2012-02-26 ENCOUNTER — Telehealth: Payer: Self-pay | Admitting: Gastroenterology

## 2012-02-26 DIAGNOSIS — F429 Obsessive-compulsive disorder, unspecified: Secondary | ICD-10-CM

## 2012-02-26 DIAGNOSIS — F4001 Agoraphobia with panic disorder: Secondary | ICD-10-CM

## 2012-02-26 MED ORDER — HYDROCORTISONE ACETATE 25 MG RE SUPP: 25.0000 mg | Freq: Two times a day (BID) | RECTAL | Status: DC

## 2012-02-26 NOTE — Telephone Encounter (Signed)
Please verify with patient, itching and burning anorectally? If so, then she can get the anusol sent to her pharmacy.

## 2012-02-26 NOTE — Telephone Encounter (Signed)
Spoke with pt- she said it burning and itching is around her anal area. She feels like it may be a hemorrhoid. She is aware rx sent to pharmacy.

## 2012-02-26 NOTE — Telephone Encounter (Signed)
Pt seen earlier this week by AS and patient called to let us know she is worse as far as itching and burning when she wipes. She is asking for someone to call her back and maybe call something in for her to use. Her pharmacy is [place] and you can reach her at [phone]

## 2012-02-26 NOTE — Telephone Encounter (Signed)
Tried to call pt- LMOM 

## 2012-02-27 LAB — STOOL CULTURE

## 2012-03-03 ENCOUNTER — Encounter (HOSPITAL_COMMUNITY): Payer: Self-pay | Admitting: Psychology

## 2012-03-03 NOTE — Progress Notes (Signed)
Patient:  [name]   DOB: 10/12/1975  MR Number: [id number]  Location: BEHAVIORAL [facility] [address] [place] [address] Dept: [phone]  Start: 3 PM End: 4 PM  Provider/Observer:     Hershal Coria PSYD  Chief Complaint:      Chief Complaint  Patient presents with  . Anxiety  . Panic Attack  . Stress    Reason For Service:     The patient reports that she started having panic attacks during her pregnancy and that her son is now 20 months old. The patient reports that she is often getting them during her menstrual cycle and that not only does she have these panic attacks she also gets significant headaches. The patient reports that prior to her pregnancy she did not have problems with her menstrual cycle. The patient reports that she is major stressors right now have to do with her family situation. The patient reports that other stressors also the ability to trigger panic attacks various times. This is been going on for one year. The patient reports that when these events happen is very scary and she feels a the symptoms would never want go away and that she desperately wants her "old life back." The patient was treated for depression back in 2006 and treated with Luvox for OCD and symptoms of depression.  Interventions Strategy:  Cognitive/behavioral psychotherapeutic interventions  Participation Level:   Active  Participation Quality:  Appropriate      Behavioral Observation:  Well Groomed, Alert, and Appropriate.   Current Psychosocial Factors: The patient reports that things have been better recently and that her husband ended up not going out of town which was a situation that she was very anxious about worry that her husband may be setting up a liaison with another woman. However, what he ended up doing was traveling back and forth each day which made her feel much better. She was not  sure whether he did that because he was trying to make her feel better whether he did that for his own purposes. However, she is been able to significantly reduce checking up on him through his phone is ready to deleted and has not been doing any of that lately..    Content of Session:   Reviewed current symptoms and continued work on therapeutic interventions for issues related to recurrent panic attacks.  Current Status:   The patient reports that she has not been repeatedly checking on the status of her husband threw her phone tracking device and is actively working on therapeutic interventions are in her compulsions..  Patient Progress:   Stable  Target Goals:   Target goals include reducing frequency, intensity, and duration of panic attacks and reducing the overall level of anxiety including feelings of tension, fear, and worry.  Last Reviewed:   02/26/2012  Goals Addressed Today:    Today we worked on issues related to therapeutic interventions for panic attacks.  Impression/Diagnosis:   The patient has a history of prior depression back in 2006 as well as OCD and other anxiety symptoms. When she was pregnant with her son who is now nearly 88-year-old she began developing panic attacks and these have persisted with her menstrual cycles primarily as well as headaches.  Diagnosis:    Axis I:  1. Panic disorder with agoraphobia   2. Obsessive compulsive disorder         Axis II: No diagnosis

## 2012-03-04 ENCOUNTER — Encounter: Payer: Self-pay | Admitting: Gastroenterology

## 2012-03-04 NOTE — Telephone Encounter (Signed)
Pt is aware of OV on 1/20 at 1030 with AS and appt card was mailed

## 2012-03-11 ENCOUNTER — Other Ambulatory Visit: Payer: Self-pay | Admitting: Internal Medicine

## 2012-03-11 DIAGNOSIS — R1013 Epigastric pain: Secondary | ICD-10-CM

## 2012-03-11 DIAGNOSIS — R197 Diarrhea, unspecified: Secondary | ICD-10-CM

## 2012-03-14 ENCOUNTER — Encounter (HOSPITAL_COMMUNITY): Payer: Self-pay | Admitting: Pharmacy Technician

## 2012-03-15 ENCOUNTER — Encounter (HOSPITAL_COMMUNITY)
Admission: RE | Disposition: A | Discharge status: Home / Self Care | Payer: Self-pay | Source: Ambulatory Visit | Attending: Internal Medicine

## 2012-03-15 ENCOUNTER — Encounter (HOSPITAL_COMMUNITY): Payer: Self-pay

## 2012-03-15 ENCOUNTER — Ambulatory Visit (HOSPITAL_COMMUNITY)
Admission: RE | Admit: 2012-03-15 | Discharge: 2012-03-15 | Disposition: A | Discharge status: Home / Self Care | Payer: BC Managed Care – PPO | Source: Ambulatory Visit | Attending: Internal Medicine | Admitting: Internal Medicine

## 2012-03-15 ENCOUNTER — Ambulatory Visit: Payer: Self-pay | Admitting: Gastroenterology

## 2012-03-15 DIAGNOSIS — K449 Diaphragmatic hernia without obstruction or gangrene: Secondary | ICD-10-CM | POA: Insufficient documentation

## 2012-03-15 DIAGNOSIS — K5710 Diverticulosis of small intestine without perforation or abscess without bleeding: Secondary | ICD-10-CM | POA: Insufficient documentation

## 2012-03-15 DIAGNOSIS — K219 Gastro-esophageal reflux disease without esophagitis: Secondary | ICD-10-CM

## 2012-03-15 DIAGNOSIS — R197 Diarrhea, unspecified: Secondary | ICD-10-CM

## 2012-03-15 DIAGNOSIS — R1013 Epigastric pain: Secondary | ICD-10-CM | POA: Insufficient documentation

## 2012-03-15 HISTORY — DX: Other specified postprocedural states: Z98.890

## 2012-03-15 HISTORY — DX: Nausea with vomiting, unspecified: R11.2

## 2012-03-15 HISTORY — PX: ESOPHAGOGASTRODUODENOSCOPY: SHX5428

## 2012-03-15 SURGERY — EGD (ESOPHAGOGASTRODUODENOSCOPY)
Anesthesia: Moderate Sedation

## 2012-03-15 MED ORDER — ONDANSETRON HCL 4 MG/2ML IJ SOLN
INTRAMUSCULAR | Status: DC | PRN
  Administered 2012-03-15: 4 mg via INTRAVENOUS

## 2012-03-15 MED ORDER — MIDAZOLAM HCL 5 MG/5ML IJ SOLN
INTRAMUSCULAR | Status: AC
  Filled 2012-03-15: qty 10

## 2012-03-15 MED ORDER — ONDANSETRON HCL 4 MG/2ML IJ SOLN
INTRAMUSCULAR | Status: AC
  Filled 2012-03-15: qty 2

## 2012-03-15 MED ORDER — MEPERIDINE HCL 100 MG/ML IJ SOLN
INTRAMUSCULAR | Status: AC
  Filled 2012-03-15: qty 2

## 2012-03-15 MED ORDER — STERILE WATER FOR IRRIGATION IR SOLN
Status: DC | PRN
  Administered 2012-03-15: 11:00:00

## 2012-03-15 MED ORDER — MIDAZOLAM HCL 5 MG/5ML IJ SOLN
INTRAMUSCULAR | Status: DC | PRN
  Administered 2012-03-15 (×3): 2 mg via INTRAVENOUS

## 2012-03-15 MED ORDER — MEPERIDINE HCL 100 MG/ML IJ SOLN
INTRAMUSCULAR | Status: DC | PRN
  Administered 2012-03-15 (×3): 50 mg via INTRAVENOUS

## 2012-03-15 MED ORDER — BUTAMBEN-TETRACAINE-BENZOCAINE 2-2-14 % EX AERO
INHALATION_SPRAY | CUTANEOUS | Status: DC | PRN
  Administered 2012-03-15: 2 via TOPICAL

## 2012-03-15 MED ORDER — SODIUM CHLORIDE 0.45 % IV SOLN
INTRAVENOUS | Status: DC
  Administered 2012-03-15: 11:00:00 via INTRAVENOUS

## 2012-03-15 NOTE — Interval H&P Note (Signed)
History and Physical Interval Note:  03/15/2012 11:13 AM  [name]  has presented today for surgery, with the diagnosis of Epigastric Pain and Diarrhea  The various methods of treatment have been discussed with the patient and family. After consideration of risks, benefits and other options for treatment, the patient has consented to  Procedure(s) (LRB) with comments: ESOPHAGOGASTRODUODENOSCOPY (EGD) (N/A) - 11:15 as a surgical intervention .  The patient's history has been reviewed, patient examined, no change in status, stable for surgery.  I have reviewed the patient's chart and labs.  Questions were answered to the patient's satisfaction.     Eula Listen  As above. EGD as planned.The risks, benefits, limitations, alternatives and imponderables have been reviewed with the patient. Potential for esophageal dilation, biopsy, etc. have also been reviewed.  Questions have been answered. All parties agreeable.

## 2012-03-15 NOTE — Op Note (Signed)
[facility] [address] [place], [address]   ENDOSCOPY PROCEDURE REPORT  PATIENT: [name], [name]  MR#: [id number] BIRTHDATE: 12/08/1975 , 36  yrs. old GENDER: Female ENDOSCOPIST: R.  Roetta Sessions, MD FACP [facility] REFERRED BY:  Simone Curia, M.D. PROCEDURE DATE:  03/15/2012 PROCEDURE:     EGD with duodenal biopsy  INDICATIONS:     GERD; chronic diarrhea. Has not yet started Nexium secondary to financial  issues but now has it available  INFORMED CONSENT:   The risks, benefits, limitations, alternatives and imponderables have been discussed.  The potential for biopsy, esophogeal dilation, etc. have also been reviewed.  Questions have been answered.  All parties agreeable.  Please see the history and physical in the medical record for more information.  MEDICATIONS: Versed 6 mg IV Demerol 150 mg IV and Zofran 4 mg a. Cetacaine spray.  DESCRIPTION OF PROCEDURE:   The Pentax Gastroscope [id number] endoscope was introduced through the mouth and advanced to the second portion of the duodenum without difficulty or limitations. The mucosal surfaces were surveyed very carefully during advancement of the scope and upon withdrawal.  Retroflexion view of the proximal stomach and esophagogastric junction was performed.      FINDINGS: Normal esophagus. Stomach empty. Small hiatal hernia. Abnormal gastric mucosa. Patent pylorus. The first second and third portion of the duodenum appeared entirely normal except for a single small diverticulum in the second portion.  THERAPEUTIC / DIAGNOSTIC MANEUVERS PERFORMED:  Biopsies of the second and third portion of the duodenum taken for histologic study   COMPLICATIONS:  None  IMPRESSION:  Small hiatal hernia. 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 Followup on pathology.    _______________________________ R. Roetta Sessions, MD  FACP [facility] eSigned:  R. Roetta Sessions, MD FACP [facility] 03/15/2012 11:44 AM     CC:

## 2012-03-15 NOTE — H&P (View-Only) (Signed)
Referring Provider: Luking, William S, MD Primary Care Physician:  LUKING,W S, MD Primary GI: Dr. Rourk   Chief Complaint  Patient presents with  . Abdominal Pain    HPI:   36-year-old female with hx of non-compliance, actually scheduled for TCS/EGD with possible small bowel biopsies at this time last year. However, she cancelled and rescheduled multiple times. Hx of chronic diarrhea, abdominal pain, rectal bleeding, maternal aunt with history of UC and PSC possibly. Negative celiac panel in 2009. Also hx of N/V, elevated transaminases with thus far negative viral markers, negative hemochromatosis, Wilson's disease, working diagnosis of fatty liver.   Notes this pain is different. Notes epigastric and LUQ pain, started this weekend. Sharp pain. Stayed in bed Sunday. Notes oily, dark loose stools. When eating/drinking feels like fire, like she is going to explode. Feels like going to vomit but doesn't. Took Protonix, which did not help. She has only been on this for about a week. States Tums makes it worse. No melena. Took Ativan, which she has been given prn for anxiety. Then shoots through to back. Notes as burning, stabbing. Worsened since yesterday evening. Noted as burning epigastric pain. Denies NSAID use.   Still having loose stools. Notes loose stools after eating. "eating impossible" the past few days. Eating worsens the abdominal pain. Prior to this episode, was having a BM once per day, which was productive and well-formed. Husband recently had food poisoning from [employer]; she had taken a bite of chicken. Presented to the ED on 12/8 with these symptoms. Mom has hx of Cdiff, last year. No problems since that time. No abx recently, but she did have abx a few months ago for sinus infection. No fever or chills. Felt like heartrate was up high last night. States +abdominal bloating. Urine clear.   No rectal bleeding currently, states rectum burns when wiping.   Lab Results  Component  Value Date   WBC 4.4 02/14/2012   HGB 14.3 02/14/2012   HCT 42.1 02/14/2012   MCV 84.7 02/14/2012   PLT 220 02/14/2012   Lab Results  Component Value Date   ALT 16 02/14/2012   AST 14 02/14/2012   ALKPHOS 74 02/14/2012   BILITOT 0.4 02/14/2012      Past Medical History  Diagnosis Date  . Kidney stones   . Abdominal pain   . Nausea & vomiting   . Rectal bleeding   . Dizziness   . Syncope   . Generalized headaches   . Sinus problem   . GERD (gastroesophageal reflux disease)   . Anxiety     in past, records note bipolar disorder; however, states this was misdiagnosed    Past Surgical History  Procedure Date  . Appendectomy   . Dilation and curettage of uterus   . Cholecystectomy 12/2010    Current Outpatient Prescriptions  Medication Sig Dispense Refill  . acetaminophen (TYLENOL) 500 MG tablet Take 1,000 mg by mouth every 6 (six) hours as needed. Headache       . LORazepam (ATIVAN) 0.5 MG tablet Take 0.5 mg by mouth daily as needed. For anxiety      . pantoprazole (PROTONIX) 20 MG tablet Take 40 mg by mouth daily. Takes one tablet daily      . HYDROcodone-acetaminophen (NORCO/VICODIN) 5-325 MG per tablet Take 1 tablet by mouth every 6 (six) hours as needed for pain.  10 tablet  0  . promethazine (PHENERGAN) 25 MG suppository Place 1 suppository (25 mg total) rectally   every 6 (six) hours as needed for nausea.  12 each  0    Allergies as of 02/23/2012 - Review Complete 02/23/2012  Allergen Reaction Noted  . Percocet (oxycodone-acetaminophen) Nausea And Vomiting 12/06/2010  . Promethazine Other (See Comments) 11/05/2010  . Metoclopramide hcl Palpitations 02/16/2011    Family History  Problem Relation Age of Onset  . Hypertension Mother   . Hypertension Father   . Ulcerative colitis Maternal Aunt     UC, age 7. Colostomy for severe UC. Liver transplant mid-20s (some sort of complication/Staph infection. H/O Primary Sclerosing Cholangitis. Kidney transplant related to her  medications.   . Irritable bowel syndrome Sister   . Colon cancer Neg Hx     History   Social History  . Marital Status: Married    Spouse Name: N/A    Number of Children: 2  . Years of Education: N/A   Occupational History  .     Social History Main Topics  . Smoking status: Never Smoker   . Smokeless tobacco: Never Used  . Alcohol Use: No  . Drug Use: No  . Sexually Active: Yes    Birth Control/ Protection: None     Comment: LMP Feb 15, 2012.    Other Topics Concern  . None   Social History Narrative   Two children age 8, age 6 months.    Review of Systems: SEE HPI  Physical Exam: BP 118/82  Pulse 90  Temp 98.5 F (36.9 C) (Oral)  Ht 5' 7" (1.702 m)  Wt 224 lb 3.2 oz (101.696 kg)  BMI 35.11 kg/m2  LMP 02/15/2012 General:   Alert and oriented. Tearful at times.  Head:  Normocephalic and atraumatic. Eyes:  Conjuctiva clear without scleral icterus. Mouth:  Oral mucosa pink and moist. Good dentition. No lesions. Neck:  Supple, without mass or thyromegaly. Heart:  S1, S2 present without murmurs, rubs, or gallops. Regular rate and rhythm. Abdomen:  +BS, soft, non-tender and non-distended. No rebound or guarding. No HSM or masses noted. Msk:  Symmetrical without gross deformities. Normal posture. Extremities:  Without edema. Neurologic:  Alert and  oriented x4;  grossly normal neurologically. Skin:  Intact without significant lesions or rashes. Cervical Nodes:  No significant cervical adenopathy. Psych:  Alert and cooperative. Normal mood and affect.  

## 2012-03-16 ENCOUNTER — Encounter (HOSPITAL_COMMUNITY): Payer: Self-pay | Admitting: Psychology

## 2012-03-16 ENCOUNTER — Ambulatory Visit (INDEPENDENT_AMBULATORY_CARE_PROVIDER_SITE_OTHER): Payer: BC Managed Care – PPO | Admitting: Psychology

## 2012-03-16 DIAGNOSIS — F429 Obsessive-compulsive disorder, unspecified: Secondary | ICD-10-CM

## 2012-03-16 DIAGNOSIS — F4001 Agoraphobia with panic disorder: Secondary | ICD-10-CM

## 2012-03-16 NOTE — Progress Notes (Signed)
Patient:  [name]   DOB: 1975/09/10  MR Number: [id number]  Location: BEHAVIORAL [facility] [address] [place] [address] Dept: [phone]  Start: 11 AM End: 12 PM  Provider/Observer:     Hershal Coria PSYD  Chief Complaint:      Chief Complaint  Patient presents with  . Stress  . Anxiety  . Depression    Reason For Service:     The patient reports that she started having panic attacks during her pregnancy and that her son is now 36 months old. The patient reports that she is often getting them during her menstrual cycle and that not only does she have these panic attacks she also gets significant headaches. The patient reports that prior to her pregnancy she did not have problems with her menstrual cycle. The patient reports that she is major stressors right now have to do with her family situation. The patient reports that other stressors also the ability to trigger panic attacks various times. This is been going on for one year. The patient reports that when these events happen is very scary and she feels a the symptoms would never want go away and that she desperately wants her "old life back." The patient was treated for depression back in 2006 and treated with Luvox for OCD and symptoms of depression.  Interventions Strategy:  Cognitive/behavioral psychotherapeutic interventions  Participation Level:   Active  Participation Quality:  Appropriate      Behavioral Observation:  Well Groomed, Alert, and Appropriate.   Current Psychosocial Factors: The patient reports that there is a lot of stress over the past couple of weeks. She reports that her mother who has a history of problems with prescription medications began excessively using muscle relaxants and taking as many as 14 per day. There was significant altered consciousness and her mother was taken to the emergency department with the  threat of having commitment papers taken out of her. The mother when in for a brief detox at the  health inpatient unit.  She is now no longer substance abuse program. The patient reports that this has added to her stress and she has become increasingly worried that she was going to have a nervous breakdown of some type. She is also dealing with the worsening help of her husband's uncle who is like a father to them. She reports that he could die any day now and this is been another major stressor. She and her husband have been doing a little bit better and she has not been monitoring his activities that she had before. However, this is been very stressful for her.  Content of Session:   Reviewed current symptoms and continued work on therapeutic interventions for issues related to recurrent panic attacks.  Current Status:   The patient reports that she has not been repeatedly checking on the status of her husband threw her phone tracking device and is actively working on therapeutic interventions are in her compulsions..  Patient Progress:   Stable  Target Goals:   Target goals include reducing frequency, intensity, and duration of panic attacks and reducing the overall level of anxiety including feelings of tension, fear, and worry.  Last Reviewed:   03/16/2012  Goals Addressed Today:    Today we worked on issues related to therapeutic interventions for panic attacks.  Impression/Diagnosis:   The patient has a history of prior depression back in 2006 as well  as OCD and other anxiety symptoms. When she was pregnant with her son who is now nearly 53-year-old she began developing panic attacks and these have persisted with her menstrual cycles primarily as well as headaches.  Diagnosis:    Axis I:  1. Panic disorder with agoraphobia   2. Obsessive compulsive disorder         Axis II: No diagnosis

## 2012-03-17 ENCOUNTER — Encounter (HOSPITAL_COMMUNITY): Payer: Self-pay | Admitting: Internal Medicine

## 2012-03-18 ENCOUNTER — Ambulatory Visit (HOSPITAL_COMMUNITY): Payer: Self-pay | Admitting: Psychology

## 2012-03-21 ENCOUNTER — Encounter: Payer: Self-pay | Admitting: Gastroenterology

## 2012-03-22 ENCOUNTER — Other Ambulatory Visit: Payer: Self-pay

## 2012-03-22 ENCOUNTER — Encounter: Payer: Self-pay | Admitting: Internal Medicine

## 2012-03-22 ENCOUNTER — Other Ambulatory Visit: Payer: Self-pay | Admitting: Internal Medicine

## 2012-03-22 DIAGNOSIS — R1013 Epigastric pain: Secondary | ICD-10-CM

## 2012-03-24 ENCOUNTER — Ambulatory Visit (HOSPITAL_COMMUNITY): Payer: Self-pay | Admitting: Psychology

## 2012-03-24 ENCOUNTER — Encounter: Payer: Self-pay | Admitting: Internal Medicine

## 2012-03-28 ENCOUNTER — Ambulatory Visit (INDEPENDENT_AMBULATORY_CARE_PROVIDER_SITE_OTHER): Payer: BC Managed Care – PPO | Admitting: Gastroenterology

## 2012-03-28 ENCOUNTER — Telehealth: Payer: Self-pay | Admitting: Internal Medicine

## 2012-03-28 ENCOUNTER — Telehealth: Payer: Self-pay | Admitting: Gastroenterology

## 2012-03-28 ENCOUNTER — Encounter (HOSPITAL_COMMUNITY): Payer: Self-pay | Admitting: Pharmacy Technician

## 2012-03-28 ENCOUNTER — Encounter: Payer: Self-pay | Admitting: Gastroenterology

## 2012-03-28 VITALS — BP 126/81 | HR 96 | Temp 98.2°F | Ht 66.0 in | Wt 222.0 lb

## 2012-03-28 DIAGNOSIS — R197 Diarrhea, unspecified: Secondary | ICD-10-CM

## 2012-03-28 DIAGNOSIS — R109 Unspecified abdominal pain: Secondary | ICD-10-CM

## 2012-03-28 LAB — H. PYLORI ANTIBODY, IGG: H Pylori IgG: <0.4 {ISR}

## 2012-03-28 MED ORDER — PEG 3350-KCL-NA BICARB-NACL 420 G PO SOLR: 4000.0000 mL | ORAL | Status: DC

## 2012-03-28 MED ORDER — DICYCLOMINE HCL 10 MG PO CAPS: 10.0000 mg | ORAL_CAPSULE | Freq: Three times a day (TID) | ORAL | Status: DC

## 2012-03-28 NOTE — Progress Notes (Signed)
Referring Provider: Merlyn Albert, MD Primary Care Physician:  Harlow Asa, MD Primary Gastroenterologist: Dr. Jena Gauss   Chief Complaint  Patient presents with  . Follow-up    HPI:   37 year old female with hx of chronic diarrhea, abdominal pain, rectal bleeding. Notes maternal aunt with hx of [place] and possible PSC. Elevated transaminases with working diagnosis of fatty liver. Recent stool studies negative. Needs TCS, which was not performed at time of EGD due to concern for tolerating the prep (pt had acute pain, N/V).  Recently underwent EGD Jan 2014: Small hiatal hernia. Small solitary duodenal diverticulum-status post biopsy of duodenum PATH: duodenal mucosa with intramucosal lymphoctyosis , without evidence of prominent villous blunting. Concern for partially developed sprue, severe obesity, bacteria overgrowth, H. pylori, clinical and serologic correlation recommended. She was negative for celiac serologies in the remote past. We have ordered an H.pylori serology.   Still notes upper abdominal pain. Notes pain after eating/drinking, like "when it's making its way down". +belching, lots of "pooting". Feels like gas is trapped. Notes very loud gurgling noises. Past week, stools darker brown, not black. Not eating. Not eating greasy food. If eats lettuce or leafy veggies, much worse. Can't do any type of lettuce.  States feels like pain pills not even helping. Recent hx of hematochezia. Postprandial loose stools.   States she has been weighing herself at home, and notes she has lost 14 lbs since December 15th. Trying to eat lean foods, baked, limiting dairy. Weight is stable since September per our records.    Past Medical History  Diagnosis Date  . Kidney stones   . Abdominal pain   . Nausea & vomiting   . Rectal bleeding   . Dizziness   . Syncope   . Generalized headaches   . Sinus problem   . GERD (gastroesophageal reflux disease)   . Anxiety     in past, records note bipolar  disorder; however, states this was misdiagnosed  . PONV (postoperative nausea and vomiting)     Past Surgical History  Procedure Date  . Appendectomy   . Dilation and curettage of uterus   . Cholecystectomy 12/2010  . Esophagogastroduodenoscopy 03/15/2012    RMR: Small hiatal hernia. Small solitary duodenal diverticulum-status post biopsy of duodenum PATH: duodenal mucosa with intramucosal lymphoctyosis , without evidence of prominent villous blunting. Concern for partially developed sprue, severe obesity, bacteria overgrowth, H. pylori, clinical and serologic correlation recommended.     Current Outpatient Prescriptions  Medication Sig Dispense Refill  . acetaminophen (TYLENOL) 500 MG tablet Take 1,000 mg by mouth every 6 (six) hours as needed. Headache       . aspirin EC 81 MG tablet Take 81 mg by mouth daily.      . cefdinir (OMNICEF) 300 MG capsule Take 300 mg by mouth 2 (two) times daily.       Marland Kitchen esomeprazole (NEXIUM) 40 MG capsule Take 1 capsule (40 mg total) by mouth 2 (two) times daily.  60 capsule  3  . LORazepam (ATIVAN) 0.5 MG tablet Take 0.5 mg by mouth daily as needed. For anxiety      . pantoprazole (PROTONIX) 40 MG tablet Take 40 mg by mouth daily.        Allergies as of 03/28/2012 - Review Complete 03/28/2012  Allergen Reaction Noted  . Percocet (oxycodone-acetaminophen) Nausea And Vomiting 12/06/2010  . Metoclopramide hcl Palpitations 02/16/2011    Family History  Problem Relation Age of Onset  . Hypertension Mother   .  Hypertension Father   . Ulcerative colitis Maternal Aunt     UC, age 79. Colostomy for severe UC. Liver transplant mid-20s (some sort of complication/Staph infection. 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   . Irritable bowel syndrome Sister   . Colon cancer Neg Hx     History   Social History  . Marital Status: Married    Spouse Name: N/A    Number of Children: 2  . Years of Education: N/A    Occupational History  .     Social History Main Topics  . Smoking status: Never Smoker   . Smokeless tobacco: Never Used  . Alcohol Use: No  . Drug Use: No  . Sexually Active: Yes    Birth Control/ Protection: None     Comment: LMP Feb 15, 2012.    Other Topics Concern  . None   Social History Narrative   Two children age 56, age 67 months.    Review of Systems: Negative unless mentioned in HPI.  Physical Exam: BP 126/81  Pulse 96  Temp 98.2 F (36.8 C) (Oral)  Ht 5\' 6"  (1.676 m)  Wt 222 lb (100.699 kg)  BMI 35.83 kg/m2  LMP 03/09/2012  General:   Alert and oriented. No distress noted. Pleasant and cooperative.  Head:  Normocephalic and atraumatic. Mouth:  Oral mucosa pink and moist. Good dentition. No lesions. Abdomen:  +BS, soft, non-tender and non-distended. No rebound or guarding. No HSM or masses noted. Msk:  Symmetrical without gross deformities. Normal posture. Extremities:  Without edema. Neurologic:  Alert and  oriented x4;  grossly normal neurologically. Skin:  Intact without significant lesions or rashes. Psych:  Alert and cooperative. Normal mood and affect.

## 2012-03-28 NOTE — Progress Notes (Signed)
Faxed to PCP

## 2012-03-28 NOTE — Telephone Encounter (Signed)
The lab orders were faxed earlier today to [facility].

## 2012-03-28 NOTE — Telephone Encounter (Signed)
Pt called to check on time of her procedure and I read off what was on the instruction sheet of her prep where she needed to be at the hospital at 1145 am, but she needs to change time where her husband can bring her to and from hospital. Please call her at [phone]

## 2012-03-28 NOTE — Patient Instructions (Addendum)
Start taking Bentyl one capsule before meals and at bedtime. This is for abdominal spasms and diarrhea.  We are setting you up for a colonoscopy with Dr. Jena Gauss.  Please go to the lab as soon as you can to have the blood work completed.  Follow the lactose-free diet for now.  Further recommendations in near future.

## 2012-03-28 NOTE — Telephone Encounter (Signed)
Pt stated she had blood drawn for H.pylori serology. I checked [facility] site, and I don't see where it was done. Maybe she is confused?  Can we make sure she has this done with her other labs (TSH, celiac serologies)

## 2012-03-28 NOTE — Assessment & Plan Note (Addendum)
37 year old female with upper abdominal pain, chronic diarrhea, CT negative in Sept 2013. Thus far, lipase, CBC, BMP, HFP all normal. EGD recently performed by Dr. Jena Gauss Jan 2014 with small hiatal hernia, path from duodenal mucosa with lymphocytosis. She was negative for celiac serologies in 2009; H.pylori serology has been ordered and is negative. I question an element of IBS, and she needs a colonoscopy due to past hx of low-volume hematochezia to assess for ?evolving IBD or other etiology. Negative stool studies on file. Unable to rule out h.pylori, SBBO due to findings from EGD.   Update celiac serologies (doubt false negative in 2009, but findings from EGD warrant further investigation) Continue Nexium Add Bentyl qac and hs Lactose-free diet TCS in near future; see diarrhea. If negative, proceed with HBT. I would like to have colonoscopy completed prior to HBT.

## 2012-03-28 NOTE — Telephone Encounter (Signed)
H.pylori is negative. Results just got in.   TSH and celiac serology is all that is left. Doris, can you please fax the lab orders to [facility]? Thanks!

## 2012-03-28 NOTE — Assessment & Plan Note (Signed)
Chronic. Hx of low-volume hematochezia in past. Negative stool studies on file. Question of IBS, differential remains evolving IBD. Could possibly have element of bile-salt diarrhea. Trial of lactose-free diet. Once colonoscopy complete, proceed with HBT if necessary. TSH today. Add Bentyl.   Proceed with TCS with Dr. Jena Gauss in near future: the risks, benefits, and alternatives have been discussed with the patient in detail. The patient states understanding and desires to proceed.

## 2012-03-28 NOTE — Telephone Encounter (Signed)
Informed pt of the H.  Pylori results.

## 2012-03-29 LAB — TSH: TSH: 1.783 u[IU]/mL (ref 0.350–4.500)

## 2012-03-29 LAB — TISSUE TRANSGLUTAMINASE, IGA: Tissue Transglutaminase Ab, IgA: 3.3 U/mL (ref <20)

## 2012-03-31 NOTE — Progress Notes (Signed)
Quick Note:  Called and informed pt. ______ 

## 2012-03-31 NOTE — Progress Notes (Signed)
Quick Note:  TSH normal, celiac serologies normal. H.pylori serology negative.  Proceed with TCS as planned. ______

## 2012-04-01 ENCOUNTER — Emergency Department (HOSPITAL_COMMUNITY): Payer: BC Managed Care – PPO

## 2012-04-01 ENCOUNTER — Encounter (HOSPITAL_COMMUNITY): Payer: Self-pay | Admitting: Emergency Medicine

## 2012-04-01 ENCOUNTER — Emergency Department (HOSPITAL_COMMUNITY)
Admission: EM | Admit: 2012-04-01 | Discharge: 2012-04-01 | Disposition: A | Discharge status: Home / Self Care | Payer: BC Managed Care – PPO | Attending: Emergency Medicine | Admitting: Emergency Medicine

## 2012-04-01 DIAGNOSIS — R5381 Other malaise: Secondary | ICD-10-CM | POA: Insufficient documentation

## 2012-04-01 DIAGNOSIS — Z8669 Personal history of other diseases of the nervous system and sense organs: Secondary | ICD-10-CM | POA: Insufficient documentation

## 2012-04-01 DIAGNOSIS — Z8719 Personal history of other diseases of the digestive system: Secondary | ICD-10-CM | POA: Insufficient documentation

## 2012-04-01 DIAGNOSIS — R4182 Altered mental status, unspecified: Secondary | ICD-10-CM | POA: Insufficient documentation

## 2012-04-01 DIAGNOSIS — K219 Gastro-esophageal reflux disease without esophagitis: Secondary | ICD-10-CM | POA: Insufficient documentation

## 2012-04-01 DIAGNOSIS — R5383 Other fatigue: Secondary | ICD-10-CM | POA: Insufficient documentation

## 2012-04-01 DIAGNOSIS — R51 Headache: Secondary | ICD-10-CM

## 2012-04-01 DIAGNOSIS — Z87442 Personal history of urinary calculi: Secondary | ICD-10-CM | POA: Insufficient documentation

## 2012-04-01 DIAGNOSIS — M436 Torticollis: Secondary | ICD-10-CM | POA: Insufficient documentation

## 2012-04-01 DIAGNOSIS — Z79899 Other long term (current) drug therapy: Secondary | ICD-10-CM | POA: Insufficient documentation

## 2012-04-01 DIAGNOSIS — Z7982 Long term (current) use of aspirin: Secondary | ICD-10-CM | POA: Insufficient documentation

## 2012-04-01 DIAGNOSIS — F411 Generalized anxiety disorder: Secondary | ICD-10-CM | POA: Insufficient documentation

## 2012-04-01 LAB — CSF CELL COUNT WITH DIFFERENTIAL
RBC Count, CSF: 18 /mm3 — ABNORMAL HIGH
Tube #: 4
WBC, CSF: 0 /mm3 (ref 0–5)

## 2012-04-01 LAB — BASIC METABOLIC PANEL
BUN: 12 mg/dL (ref 6–23)
CO2: 27 mEq/L (ref 19–32)
Calcium: 9.4 mg/dL (ref 8.4–10.5)
Chloride: 104 mEq/L (ref 96–112)
Creatinine, Ser: 0.97 mg/dL (ref 0.50–1.10)
GFR calc Af Amer: 86 mL/min — ABNORMAL LOW (ref >90)
GFR calc non Af Amer: 74 mL/min — ABNORMAL LOW (ref >90)
Glucose, Bld: 102 mg/dL — ABNORMAL HIGH (ref 70–99)
Potassium: 3.9 mEq/L (ref 3.5–5.1)
Sodium: 138 mEq/L (ref 135–145)

## 2012-04-01 LAB — CBC
HCT: 41.3 % (ref 36.0–46.0)
Hemoglobin: 13.8 g/dL (ref 12.0–15.0)
MCH: 28.5 pg (ref 26.0–34.0)
MCHC: 33.4 g/dL (ref 30.0–36.0)
MCV: 85.2 fL (ref 78.0–100.0)
Platelets: 221 10*3/uL (ref 150–400)
RBC: 4.85 MIL/uL (ref 3.87–5.11)
RDW: 13.1 % (ref 11.5–15.5)
WBC: 5.2 10*3/uL (ref 4.0–10.5)

## 2012-04-01 LAB — GLUCOSE, CSF: Glucose, CSF: 63 mg/dL (ref 43–76)

## 2012-04-01 LAB — PROTEIN, CSF: Total  Protein, CSF: 17 mg/dL (ref 15–45)

## 2012-04-01 MED ORDER — LORAZEPAM 2 MG/ML IJ SOLN
1.0000 mg | Freq: Once | INTRAMUSCULAR | Status: AC
  Administered 2012-04-01: 1 mg via INTRAVENOUS
  Filled 2012-04-01: qty 1

## 2012-04-01 MED ORDER — OXYCODONE-ACETAMINOPHEN 5-325 MG PO TABS: 2.0000 | ORAL_TABLET | Freq: Once | ORAL | Status: DC

## 2012-04-01 MED ORDER — KETOROLAC TROMETHAMINE 30 MG/ML IJ SOLN
30.0000 mg | Freq: Once | INTRAMUSCULAR | Status: AC
  Administered 2012-04-01: 30 mg via INTRAVENOUS
  Filled 2012-04-01: qty 1

## 2012-04-01 MED ORDER — SODIUM CHLORIDE 0.9 % IV BOLUS (SEPSIS)
1000.0000 mL | Freq: Once | INTRAVENOUS | Status: AC
  Administered 2012-04-01: 1000 mL via INTRAVENOUS

## 2012-04-01 MED ORDER — TRAMADOL HCL 50 MG PO TABS: 50.0000 mg | ORAL_TABLET | Freq: Four times a day (QID) | ORAL | Status: DC | PRN

## 2012-04-01 MED ORDER — DIPHENHYDRAMINE HCL 50 MG/ML IJ SOLN
12.5000 mg | Freq: Once | INTRAMUSCULAR | Status: AC
  Administered 2012-04-01: 12.5 mg via INTRAVENOUS
  Filled 2012-04-01: qty 1

## 2012-04-01 MED ORDER — PROMETHAZINE HCL 25 MG/ML IJ SOLN
12.5000 mg | Freq: Once | INTRAMUSCULAR | Status: AC
  Administered 2012-04-01: 12.5 mg via INTRAVENOUS
  Filled 2012-04-01: qty 1

## 2012-04-01 MED ORDER — DEXAMETHASONE SODIUM PHOSPHATE 4 MG/ML IJ SOLN
8.0000 mg | Freq: Once | INTRAMUSCULAR | Status: AC
  Administered 2012-04-01: 8 mg via INTRAVENOUS
  Filled 2012-04-01: qty 2

## 2012-04-01 MED ORDER — MORPHINE SULFATE 4 MG/ML IJ SOLN
6.0000 mg | Freq: Once | INTRAMUSCULAR | Status: AC
  Administered 2012-04-01: 6 mg via INTRAVENOUS
  Filled 2012-04-01: qty 2

## 2012-04-01 NOTE — ED Notes (Addendum)
Lumbar puncture complete. Consent obtained. Pt tolerated procedure well. Pt v/s remained stable throughout procedure. MD at bedside. nad noted.Pt on monitor and lying flat in room.

## 2012-04-01 NOTE — ED Provider Notes (Signed)
History   This chart was scribed for Raeford Razor, MD by Melba Coon, ED Scribe. The patient was seen in room APA19/APA19 and the patient's care was started at 8:38AM.    CSN: [id number]  Arrival date & time 04/01/12  0808   None     Chief Complaint  Patient presents with  . Headache  . Torticollis  . Weakness    (Consider location/radiation/quality/duration/timing/severity/associated sxs/prior treatment) The history is provided by the patient. No language interpreter was used.   [name] is a 37 y.o. female who presents to the Emergency Department complaining of constant, moderate to severe neck pain and stiffness with associated throbbing headache with an onset yesterday morning. She reports she woke up with the pain. She reports this is the worse pain she has ever felt and that her pain is so severe, she reports difficulty sleeping. Last week, she had CP and a sinus infection and she originally thought the current symptoms originated from last week's sinus infection but no longer feels this way. She was given omnicef and Zithromax and is currently still taking it on a 10 day course. The right side of her neck hurts more than the left side of her neck and radiates down to her upper chest and upper back. Applied heat and ice and ibuprofen do not alleviate the pain. Turning her neck aggravates the pain. When she moves her neck back and forth, her head starts to throb, especially if she tilts her head all the way back. She reports the neck pain does not feel like she has a crick in her neck. She report visual deficits this morning; she reports her eyes are more sensitive to light with mild pain since yesterday. She reports she has been intermittently, mildly confused since onset. She reports bilateral leg tingling since her neck pain started. She reports cold intolerance last night without fever. She reports decreased appetite and fluid intake since last week. Denies sore throat, rash,  SOB, abdominal pain, nausea, emesis, diarrhea, dysuria, or extremity pain, edema, weakness, or numbness. Denies history of neck surgery. No other pertinent medical symptoms.  Past Medical History  Diagnosis Date  . Kidney stones   . Abdominal pain   . Nausea & vomiting   . Rectal bleeding   . Dizziness   . Syncope   . Generalized headaches   . Sinus problem   . GERD (gastroesophageal reflux disease)   . Anxiety     in past, records note bipolar disorder; however, states this was misdiagnosed  . PONV (postoperative nausea and vomiting)     Past Surgical History  Procedure Date  . Appendectomy   . Dilation and curettage of uterus   . Cholecystectomy 12/2010  . Esophagogastroduodenoscopy 03/15/2012    RMR: Small hiatal hernia. Small solitary duodenal diverticulum-status post biopsy of duodenum PATH: duodenal mucosa with intramucosal lymphoctyosis , without evidence of prominent villous blunting. Concern for partially developed sprue, severe obesity, bacteria overgrowth, H. pylori, clinical and serologic correlation recommended.     Family History  Problem Relation Age of Onset  . Hypertension Mother   . Hypertension Father   . Ulcerative colitis Maternal Aunt     UC, age 76. Colostomy for severe UC. Liver transplant mid-20s (some sort of complication/Staph infection. 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   . Irritable bowel syndrome Sister   . Colon cancer Neg Hx     History  Substance Use Topics  . Smoking status: Never  Smoker   . Smokeless tobacco: Never Used  . Alcohol Use: No    OB History    Grav Para Term Preterm Abortions TAB SAB Ect Mult Living   2 2        2       Review of Systems 10 Systems reviewed and all are negative for acute change except as noted in the HPI.   Allergies  Percocet and Metoclopramide hcl  Home Medications   Current Outpatient Rx  Name  Route  Sig  Dispense  Refill  . ACETAMINOPHEN 500 MG  PO TABS   Oral   Take 1,000 mg by mouth every 6 (six) hours as needed. For pain         . ASPIRIN EC 81 MG PO TBEC   Oral   Take 81 mg by mouth daily as needed. For pain         . ESOMEPRAZOLE MAGNESIUM 40 MG PO CPDR   Oral   Take 1 capsule (40 mg total) by mouth 2 (two) times daily.   60 capsule   3   . HYDROCODONE-ACETAMINOPHEN 5-325 MG PO TABS   Oral   Take 1 tablet by mouth every 6 (six) hours as needed. For pain         . LORAZEPAM 0.5 MG PO TABS   Oral   Take 0.5 mg by mouth daily as needed. For anxiety           BP 130/78  Pulse 88  Temp 98.2 F (36.8 C) (Oral)  Resp 18  Ht 5\' 7"  (1.702 m)  Wt 222 lb (100.699 kg)  BMI 34.77 kg/m2  SpO2 96%  LMP 03/09/2012  Physical Exam  Nursing note and vitals reviewed. Constitutional: She appears well-developed and well-nourished. She appears distressed.  HENT:  Head: Normocephalic and atraumatic.  Mouth/Throat: No oropharyngeal exudate.  Eyes: Conjunctivae normal and EOM are normal. Pupils are equal, round, and reactive to light. Right eye exhibits no discharge. Left eye exhibits no discharge.  Neck: Rigidity present. No Brudzinski's sign and no Kernig's sign noted.  Cardiovascular: Normal rate, regular rhythm and normal heart sounds.  Exam reveals no gallop and no friction rub.   No murmur heard. Pulmonary/Chest: Effort normal and breath sounds normal. No stridor. No respiratory distress. She has no wheezes. She has no rales. She exhibits no tenderness.  Abdominal: Soft. Bowel sounds are normal. She exhibits no distension and no mass. There is no tenderness. There is no rebound.  Musculoskeletal: She exhibits no edema and no tenderness.  Lymphadenopathy:    She has no cervical adenopathy.  Neurological: She is alert.       Awake, alert, cooperative and aware of situation; motor strength bilaterally; sensation normal to light touch bilaterally; peripheral visual fields full to confrontation; no facial asymmetry;  tongue midline; major cranial nerves appear intact; no pronator drift, normal finger to nose bilaterally.  Skin: Skin is warm and dry. No rash noted.  Psychiatric: Thought content normal. Her mood appears anxious.    ED Course  Procedures (including critical care time)  DIAGNOSTIC STUDIES: Oxygen Saturation is 96% on room air, adequate by my interpretation.    COORDINATION OF CARE:  8:44AM - CBC and BMP will be ordered, and lumbar puncture is discussed with Orland Mustard. She is hesitant at this time but has not reached a final decision.  9:47AM - lab results reviewed Labs Reviewed  BASIC METABOLIC PANEL - Abnormal; Notable for the following:  Glucose, Bld 102 (*)     GFR calc non Af Amer 74 (*)     GFR calc Af Amer 86 (*)     All other components within normal limits  CBC    9:49AM - recheck; Mrs Gurganus appears to be less distressed but still very anxious. Lumbar puncture is discussed; she still has not reached a final decision.  9:51AM - Mrs Motsinger ultimately decides to carry on with the lumbar puncture procedure.   PROCEDURE:  Lumbar Puncture.  INDICATION:  Altered mental status and neck pain and stiffness  PROCEDURE OPERATOR: Raeford Razor, MD  CONSENT: Given by patient and informed of all complications.  PROCEDURE SUMMARY:  A time-out was performed. The patient was placed in the LEFT lateral decubitus position in a semi-fetal position with help from the nursing staff. The area was cleansed and draped in usual sterile fashion. Anesthesia was achieved with 1% lidocaine. A 20-gauge 3.5-inch spinal needle was placed in the L4-L5 interspace. On the first attempt, blood tinged cerebral spinal fluid was obtained which cleared. Four tubes were filled with 4 mL of CSF. These were sent for the usual tests, including 1 tube to be held for further analysis if needed. The patient had no immediate complications and tolerated the procedure well.  ESTIMATED BLOOD LOSS:  negligible  Ct Head Wo Contrast  04/01/2012  *RADIOLOGY REPORT*  Clinical Data: Headache.  CT HEAD WITHOUT CONTRAST  Technique:  Contiguous axial images were obtained from the base of the skull through the vertex without contrast.  Comparison: 02/09/2011.  Findings: The ventricles are normal.  No extra-axial fluid collections are seen.  The brainstem and cerebellum are unremarkable.  No acute intracranial findings such as infarction or hemorrhage.  No mass lesions. There is one small dot of air near the left petrous apex.  This is most likely venous air related to an IV insertion.  The bony calvarium is intact.  The visualized paranasal sinuses and mastoid air cells are clear.  IMPRESSION: No acute intracranial findings or mass lesion.   Original Report Authenticated By: Rudie Meyer, M.D.      1. Headache       MDM  37 year old female with neck pain and neck stiffness. Suspect muscular in nature. Clinically doubt bacterial meningitis, particularly with symptom onset while being on Omnicef. Cannot exclude though. Discussed lumbar puncture with patient who is agreeable. We'll continue to treat symptoms in the meantime.  Pt c/o worsening HA. May be LP related. Less likely SAH with gradual onset, onset not with exertion, pt's age, no syncope, nonfocal neuro exam, etc but will CT.  LP not consistent with meningitis. Patient does have some red blood cells, but her tap was "bloody." I have a low suspicion for subarachnoid hemorrhage. Her head CT is unremarkable. Current neuro exam prior to discharge remains nonfocal. Plan symptomatic treatment at this time. Emergent return precautions were discussed.   I personally preformed the services scribed in my presence. The recorded information has been reviewed is accurate. Raeford Razor, MD.      Raeford Razor, MD 04/01/12 [phone]

## 2012-04-01 NOTE — ED Notes (Addendum)
Pt c/o h/a yesterday, then stiff neck started. Pt states she feels confused and "not right". Co gen weakness. Pt is alert/oriented. Sensitive to light. States feels the neck pain is radiating into back. Denies n/v/d. No dizziness. Pt c/o tingling/numbness intemrittant to R arm. Pt states nothing feels coordinated. Heavy legs.

## 2012-04-02 ENCOUNTER — Encounter (HOSPITAL_COMMUNITY): Payer: Self-pay | Admitting: *Deleted

## 2012-04-02 ENCOUNTER — Emergency Department (HOSPITAL_COMMUNITY)
Admission: EM | Admit: 2012-04-02 | Discharge: 2012-04-02 | Disposition: A | Discharge status: Home / Self Care | Payer: BC Managed Care – PPO | Attending: Emergency Medicine | Admitting: Emergency Medicine

## 2012-04-02 ENCOUNTER — Emergency Department (HOSPITAL_COMMUNITY): Payer: BC Managed Care – PPO

## 2012-04-02 DIAGNOSIS — Z87442 Personal history of urinary calculi: Secondary | ICD-10-CM | POA: Insufficient documentation

## 2012-04-02 DIAGNOSIS — Z8709 Personal history of other diseases of the respiratory system: Secondary | ICD-10-CM | POA: Insufficient documentation

## 2012-04-02 DIAGNOSIS — Z7982 Long term (current) use of aspirin: Secondary | ICD-10-CM | POA: Insufficient documentation

## 2012-04-02 DIAGNOSIS — F411 Generalized anxiety disorder: Secondary | ICD-10-CM | POA: Insufficient documentation

## 2012-04-02 DIAGNOSIS — R51 Headache: Secondary | ICD-10-CM | POA: Insufficient documentation

## 2012-04-02 DIAGNOSIS — M542 Cervicalgia: Secondary | ICD-10-CM | POA: Insufficient documentation

## 2012-04-02 DIAGNOSIS — Z79899 Other long term (current) drug therapy: Secondary | ICD-10-CM | POA: Insufficient documentation

## 2012-04-02 DIAGNOSIS — K219 Gastro-esophageal reflux disease without esophagitis: Secondary | ICD-10-CM | POA: Insufficient documentation

## 2012-04-02 DIAGNOSIS — Z8719 Personal history of other diseases of the digestive system: Secondary | ICD-10-CM | POA: Insufficient documentation

## 2012-04-02 DIAGNOSIS — Z8669 Personal history of other diseases of the nervous system and sense organs: Secondary | ICD-10-CM | POA: Insufficient documentation

## 2012-04-02 LAB — GRAM STAIN: Gram Stain: NONE SEEN

## 2012-04-02 MED ORDER — HYDROMORPHONE HCL PF 1 MG/ML IJ SOLN
1.0000 mg | INTRAMUSCULAR | Status: DC | PRN
  Administered 2012-04-02: 1 mg via INTRAVENOUS
  Filled 2012-04-02: qty 1

## 2012-04-02 MED ORDER — IOHEXOL 350 MG/ML SOLN
80.0000 mL | Freq: Once | INTRAVENOUS | Status: AC | PRN
  Administered 2012-04-02: 80 mL via INTRAVENOUS

## 2012-04-02 MED ORDER — CAFFEINE-SODIUM BENZOATE 125-125 MG/ML IJ SOLN
500.0000 mg | Freq: Once | INTRAMUSCULAR | Status: DC
  Filled 2012-04-02: qty 2

## 2012-04-02 MED ORDER — SODIUM CHLORIDE 0.9 % IV BOLUS (SEPSIS)
1000.0000 mL | Freq: Once | INTRAVENOUS | Status: AC
  Administered 2012-04-02: 1000 mL via INTRAVENOUS

## 2012-04-02 MED ORDER — PROMETHAZINE HCL 25 MG/ML IJ SOLN
25.0000 mg | Freq: Four times a day (QID) | INTRAMUSCULAR | Status: DC | PRN
  Administered 2012-04-02: 25 mg via INTRAVENOUS
  Filled 2012-04-02: qty 1

## 2012-04-02 MED ORDER — ONDANSETRON HCL 4 MG/2ML IJ SOLN: 4.0000 mg | Freq: Once | INTRAMUSCULAR | Status: DC

## 2012-04-02 MED ORDER — HYDROMORPHONE HCL PF 1 MG/ML IJ SOLN: 1.0000 mg | INTRAMUSCULAR | Status: DC | PRN

## 2012-04-02 MED ORDER — MORPHINE SULFATE 4 MG/ML IJ SOLN
4.0000 mg | Freq: Once | INTRAMUSCULAR | Status: AC
  Administered 2012-04-02: 4 mg via INTRAVENOUS
  Filled 2012-04-02: qty 1

## 2012-04-02 MED ORDER — ONDANSETRON HCL 4 MG/2ML IJ SOLN
4.0000 mg | Freq: Once | INTRAMUSCULAR | Status: AC
  Administered 2012-04-02: 4 mg via INTRAVENOUS
  Filled 2012-04-02: qty 2

## 2012-04-02 MED ORDER — HYDROCODONE-ACETAMINOPHEN 5-325 MG PO TABS: 1.0000 | ORAL_TABLET | Freq: Four times a day (QID) | ORAL | Status: DC | PRN

## 2012-04-02 NOTE — ED Provider Notes (Signed)
History  This chart was scribed for Gerhard Munch, MD by Ardeen Jourdain, ED Scribe. This patient was seen in room APA18/APA18 and the patient's care was started at 1836.  CSN: [id number]  Arrival date & time 04/02/12  1826   First MD Initiated Contact with Patient 04/02/12 1836      Chief Complaint  Patient presents with  . Headache    The history is provided by the patient. No language interpreter was used.    [name] is a 37 y.o. female who presents to the Emergency Department complaining of a gradually worsening HA that began a few hours ago. She states the current HA began a few hours ago after sitting up. She states she was seen in the ER yesterday c/o HA and neck pain and received an LP. She reports feeling asymptomatic upon leaving the hospital. She describes the HA yesterday was the worse she had ever had. She describes the HA today as different from the one yesterday and states it is worse. She states the HA She denies any visual disturbance, LOC, problems ambulating and problems speaking.    Past Medical History  Diagnosis Date  . Kidney stones   . Abdominal pain   . Nausea & vomiting   . Rectal bleeding   . Dizziness   . Syncope   . Generalized headaches   . Sinus problem   . GERD (gastroesophageal reflux disease)   . Anxiety     in past, records note bipolar disorder; however, states this was misdiagnosed  . PONV (postoperative nausea and vomiting)     Past Surgical History  Procedure Date  . Appendectomy   . Dilation and curettage of uterus   . Cholecystectomy 12/2010  . Esophagogastroduodenoscopy 03/15/2012    RMR: Small hiatal hernia. Small solitary duodenal diverticulum-status post biopsy of duodenum PATH: duodenal mucosa with intramucosal lymphoctyosis , without evidence of prominent villous blunting. Concern for partially developed sprue, severe obesity, bacteria overgrowth, H. pylori, clinical and serologic correlation recommended.     Family  History  Problem Relation Age of Onset  . Hypertension Mother   . Hypertension Father   . Ulcerative colitis Maternal Aunt     UC, age 28. Colostomy for severe UC. Liver transplant mid-20s (some sort of complication/Staph infection. 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   . Irritable bowel syndrome Sister   . Colon cancer Neg Hx     History  Substance Use Topics  . Smoking status: Never Smoker   . Smokeless tobacco: Never Used  . Alcohol Use: No    OB History    Grav Para Term Preterm Abortions TAB SAB Ect Mult Living   2 2        2       Review of Systems  Constitutional:       Per HPI, otherwise negative  HENT:       Per HPI, otherwise negative  Eyes: Negative.   Respiratory:       Per HPI, otherwise negative  Cardiovascular:       Per HPI, otherwise negative  Gastrointestinal: Negative for vomiting.  Genitourinary: Negative.   Musculoskeletal:       Per HPI, otherwise negative  Skin: Negative.   Neurological: Negative for syncope.    Allergies  Percocet and Metoclopramide hcl  Home Medications   Current Outpatient Rx  Name  Route  Sig  Dispense  Refill  . ACETAMINOPHEN 500 MG PO TABS  Oral   Take 1,000 mg by mouth every 6 (six) hours as needed. For pain         . ASPIRIN EC 81 MG PO TBEC   Oral   Take 81 mg by mouth daily as needed. For pain         . ESOMEPRAZOLE MAGNESIUM 40 MG PO CPDR   Oral   Take 1 capsule (40 mg total) by mouth 2 (two) times daily.   60 capsule   3   . HYDROCODONE-ACETAMINOPHEN 5-325 MG PO TABS   Oral   Take 1 tablet by mouth every 6 (six) hours as needed. For pain         . LORAZEPAM 0.5 MG PO TABS   Oral   Take 0.5 mg by mouth daily as needed. For anxiety         . TRAMADOL HCL 50 MG PO TABS   Oral   Take 1 tablet (50 mg total) by mouth every 6 (six) hours as needed for pain.   15 tablet   0     Triage Vitals: BP 135/76  Pulse 106  Temp 98 F (36.7 C) (Oral)   Resp 20  Ht 5\' 2"  (1.575 m)  Wt 222 lb (100.699 kg)  BMI 40.60 kg/m2  SpO2 99%  LMP 03/09/2012  Physical Exam  Nursing note and vitals reviewed. Constitutional: She is oriented to person, place, and time. She appears well-developed and well-nourished. No distress.  HENT:  Head: Normocephalic and atraumatic.  Eyes: Conjunctivae normal and EOM are normal. Pupils are equal, round, and reactive to light. Right eye exhibits no discharge. Left eye exhibits no discharge.  Neck: Normal range of motion.  Cardiovascular: Normal rate, regular rhythm and normal heart sounds.  Exam reveals no gallop and no friction rub.   No murmur heard. Pulmonary/Chest: Effort normal and breath sounds normal. No stridor. No respiratory distress. She has no wheezes. She has no rales. She exhibits no tenderness.  Abdominal: Soft. Bowel sounds are normal. She exhibits no distension and no mass. There is no tenderness. There is no rebound and no guarding.  Musculoskeletal: She exhibits no edema.  Neurological: She is alert and oriented to person, place, and time. No cranial nerve deficit.       Equal grip strength bilaterally   Skin: Skin is warm and dry.  Psychiatric: She has a normal mood and affect.    ED Course  Procedures (including critical care time)  DIAGNOSTIC STUDIES: Oxygen Saturation is 99% on room air, normal by my interpretation.    COORDINATION OF CARE:  6:37 PMDiscussed treatment plan which includes  (CXR, CBC panel, UA) with pt at bedside and pt agreed to plan.     Labs Reviewed - No data to display Ct Head Wo Contrast  04/01/2012  *RADIOLOGY REPORT*  Clinical Data: Headache.  CT HEAD WITHOUT CONTRAST  Technique:  Contiguous axial images were obtained from the base of the skull through the vertex without contrast.  Comparison: 02/09/2011.  Findings: The ventricles are normal.  No extra-axial fluid collections are seen.  The brainstem and cerebellum are unremarkable.  No acute intracranial  findings such as infarction or hemorrhage.  No mass lesions. There is one small dot of air near the left petrous apex.  This is most likely venous air related to an IV insertion.  The bony calvarium is intact.  The visualized paranasal sinuses and mastoid air cells are clear.  IMPRESSION: No acute intracranial findings or mass  lesion.   Original Report Authenticated By: Rudie Meyer, M.D.      No diagnosis found.   8:34 PM I reviewed results thus far with the patient.  She has mild improvement with meds.  No new complaints.  9:48 PM Patient is substantially better, sitting nearly upright, drinking fluids.  She states her headache is improved substantially.  We discussed at length all results, thoughts, the need for ongoing management as an outpatient, consideration of post-LP versus new chronic headaches. MDM   I personally performed the services described in this documentation, which was scribed in my presence. The recorded information has been reviewed and is accurate.   This young female presents today after initial evaluation for severe headache recurrence of headache, now more severe.  On exam the patient is neurologically intact, in no distress, though she appears uncomfortable.  With the LP results yesterday, and the string some evidence of blood, though this is likely from a traumatic tap, the patient had CTA to document the CT done without contrast yesterday.  With this negative result, as well as the negative CT from yesterday, and the largely reassuring LP results, there is little suspicion for acute subarachnoid hemorrhage.  Additionally, the patient has no neurologic deficits, improved substantially throughout her ED course tonight. Discussed at length the need for ongoing followup, and given the improvement, the absence of distress, the patient was discharged in stable condition.  Gerhard Munch, MD 04/02/12 2149

## 2012-04-02 NOTE — ED Notes (Signed)
Pt was seen in er yesterday for headache and neck pain, had lp done yesterday, felt better after going home, headache returned today after pt got up and was moving around,

## 2012-04-02 NOTE — ED Notes (Signed)
Patient transported to CT 

## 2012-04-02 NOTE — ED Notes (Signed)
Dr Jeraldine Loots in room with patient

## 2012-04-02 NOTE — ED Notes (Signed)
Patient complaining of pain. Dr Jeraldine Loots notified and he was going to order more pain meds but now patient states she will waitand see if it comes back. Stated it got bad when she was taken to ct and had to move to ct table

## 2012-04-03 ENCOUNTER — Encounter (HOSPITAL_COMMUNITY): Payer: Self-pay | Admitting: Emergency Medicine

## 2012-04-03 ENCOUNTER — Emergency Department (HOSPITAL_COMMUNITY)
Admission: EM | Admit: 2012-04-03 | Discharge: 2012-04-03 | Disposition: A | Discharge status: Home / Self Care | Payer: BC Managed Care – PPO | Attending: Emergency Medicine | Admitting: Emergency Medicine

## 2012-04-03 DIAGNOSIS — M436 Torticollis: Secondary | ICD-10-CM | POA: Insufficient documentation

## 2012-04-03 DIAGNOSIS — Z8719 Personal history of other diseases of the digestive system: Secondary | ICD-10-CM | POA: Insufficient documentation

## 2012-04-03 DIAGNOSIS — Z8709 Personal history of other diseases of the respiratory system: Secondary | ICD-10-CM | POA: Insufficient documentation

## 2012-04-03 DIAGNOSIS — G971 Other reaction to spinal and lumbar puncture: Secondary | ICD-10-CM | POA: Insufficient documentation

## 2012-04-03 DIAGNOSIS — Z8669 Personal history of other diseases of the nervous system and sense organs: Secondary | ICD-10-CM | POA: Insufficient documentation

## 2012-04-03 DIAGNOSIS — H538 Other visual disturbances: Secondary | ICD-10-CM | POA: Insufficient documentation

## 2012-04-03 DIAGNOSIS — Z8679 Personal history of other diseases of the circulatory system: Secondary | ICD-10-CM | POA: Insufficient documentation

## 2012-04-03 DIAGNOSIS — F411 Generalized anxiety disorder: Secondary | ICD-10-CM | POA: Insufficient documentation

## 2012-04-03 DIAGNOSIS — Z79899 Other long term (current) drug therapy: Secondary | ICD-10-CM | POA: Insufficient documentation

## 2012-04-03 DIAGNOSIS — R51 Headache: Secondary | ICD-10-CM | POA: Insufficient documentation

## 2012-04-03 DIAGNOSIS — K219 Gastro-esophageal reflux disease without esophagitis: Secondary | ICD-10-CM | POA: Insufficient documentation

## 2012-04-03 DIAGNOSIS — Z87442 Personal history of urinary calculi: Secondary | ICD-10-CM | POA: Insufficient documentation

## 2012-04-03 DIAGNOSIS — Z7982 Long term (current) use of aspirin: Secondary | ICD-10-CM | POA: Insufficient documentation

## 2012-04-03 MED ORDER — SODIUM CHLORIDE 0.9 % IV SOLN
1000.0000 mL | Freq: Once | INTRAVENOUS | Status: AC
  Administered 2012-04-03: 1000 mL via INTRAVENOUS

## 2012-04-03 MED ORDER — LORAZEPAM 2 MG/ML IJ SOLN
1.0000 mg | Freq: Once | INTRAMUSCULAR | Status: AC
  Administered 2012-04-03: 1 mg via INTRAVENOUS

## 2012-04-03 MED ORDER — LORAZEPAM 2 MG/ML IJ SOLN
INTRAMUSCULAR | Status: AC
  Filled 2012-04-03: qty 1

## 2012-04-03 MED ORDER — ONDANSETRON HCL 4 MG/2ML IJ SOLN
4.0000 mg | Freq: Once | INTRAMUSCULAR | Status: AC
  Administered 2012-04-03: 4 mg via INTRAVENOUS
  Filled 2012-04-03: qty 2

## 2012-04-03 MED ORDER — HYDROMORPHONE HCL PF 1 MG/ML IJ SOLN
1.0000 mg | INTRAMUSCULAR | Status: DC | PRN
  Filled 2012-04-03: qty 1

## 2012-04-03 MED ORDER — MORPHINE SULFATE 4 MG/ML IJ SOLN
4.0000 mg | INTRAMUSCULAR | Status: AC | PRN
  Administered 2012-04-03 (×3): 4 mg via INTRAVENOUS
  Filled 2012-04-03 (×2): qty 1

## 2012-04-03 MED ORDER — SODIUM CHLORIDE 0.9 % IV SOLN
1000.0000 mL | INTRAVENOUS | Status: DC
  Administered 2012-04-03: 1000 mL via INTRAVENOUS

## 2012-04-03 MED ORDER — MORPHINE SULFATE 4 MG/ML IJ SOLN
INTRAMUSCULAR | Status: AC
  Filled 2012-04-03: qty 1

## 2012-04-03 NOTE — ED Notes (Signed)
Dr Lynelle Doctor at bedside discussing plan of care with pt and family.

## 2012-04-03 NOTE — ED Notes (Signed)
Pt reports not having her prescriptions filled from the last 2 visits. Pt states " nothing has helped my headaches, and I'm tired of coming here to get rid of my headache". Pt encouraged to have prescriptions filled and to follow discharge instructions.

## 2012-04-03 NOTE — ED Notes (Signed)
Patient states she still has a headache. RN made aware. Patient would like to see the MD.

## 2012-04-03 NOTE — ED Notes (Signed)
Patient was seen here on Friday for headache and neck stiffness. Patient had lumbar puncture. Per patient "felt a little better, then it started again." Patient progressively getting worse was seen again last night. Per patient headache much worse now with blurred vision and neck is stiffer. Patient also c/o low back pain.

## 2012-04-03 NOTE — ED Notes (Signed)
Pt returned to Ed secondary to Headache, and "stiff neck". Pt has been seen past 3 days with same c/o. NAD noted at this time. Pt denies emesis at this time.

## 2012-04-03 NOTE — ED Provider Notes (Signed)
History    CSN: [id number] Arrival date & time 04/03/12  1223 First MD Initiated Contact with Patient 04/03/12 1347      Chief Complaint  Patient presents with  . Headache  . Blurred Vision  . Torticollis    HPI Patient presents emergency room with complaints of persistent headache. Symptoms have been ongoing since at least Friday. She was initially seen in the emergency room on Friday because of her headache and neck tenderness. As part of her evaluation, the patient had a head CT as well is a CT angiogram performed. She also had a lumbar puncture performed on Friday. The patient states her headache has not improved and has gotten progressively worse since it started. The patient also states that now the headache is worse when she tries to sit up. She also feels like she has tension in her neck and back. Patient denies any fevers, cough, vomiting or diarrhea. She denies any difficulty moving her arms or legs.  Patient has not been able to fill the prescriptions she was given and she has not been taking anything for her headache at home. Past Medical History  Diagnosis Date  . Kidney stones   . Abdominal pain   . Nausea & vomiting   . Rectal bleeding   . Dizziness   . Syncope   . Generalized headaches   . Sinus problem   . GERD (gastroesophageal reflux disease)   . Anxiety     in past, records note bipolar disorder; however, states this was misdiagnosed  . PONV (postoperative nausea and vomiting)     Past Surgical History  Procedure Date  . Appendectomy   . Dilation and curettage of uterus   . Cholecystectomy 12/2010  . Esophagogastroduodenoscopy 03/15/2012    RMR: Small hiatal hernia. Small solitary duodenal diverticulum-status post biopsy of duodenum PATH: duodenal mucosa with intramucosal lymphoctyosis , without evidence of prominent villous blunting. Concern for partially developed sprue, severe obesity, bacteria overgrowth, H. pylori, clinical and serologic correlation  recommended.     Family History  Problem Relation Age of Onset  . Hypertension Mother   . Hypertension Father   . Ulcerative colitis Maternal Aunt     UC, age 93. Colostomy for severe UC. Liver transplant mid-20s (some sort of complication/Staph infection. 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   . Irritable bowel syndrome Sister   . Colon cancer Neg Hx     History  Substance Use Topics  . Smoking status: Never Smoker   . Smokeless tobacco: Never Used  . Alcohol Use: No    OB History    Grav Para Term Preterm Abortions TAB SAB Ect Mult Living   2 2        2       Review of Systems  All other systems reviewed and are negative.    Allergies  Percocet and Metoclopramide hcl  Home Medications   Current Outpatient Rx  Name  Route  Sig  Dispense  Refill  . ASPIRIN EC 81 MG PO TBEC   Oral   Take 81 mg by mouth daily as needed. For pain         . ESOMEPRAZOLE MAGNESIUM 40 MG PO CPDR   Oral   Take 1 capsule (40 mg total) by mouth 2 (two) times daily.   60 capsule   3   . HYDROCODONE-ACETAMINOPHEN 5-325 MG PO TABS   Oral   Take 1 tablet by mouth every 6 (six) hours  as needed. For pain   15 tablet   0   . LORAZEPAM 0.5 MG PO TABS   Oral   Take 0.5 mg by mouth daily as needed. For anxiety         . TRAMADOL HCL 50 MG PO TABS   Oral   Take 1 tablet (50 mg total) by mouth every 6 (six) hours as needed for pain.   15 tablet   0     LMP 03/09/2012  Physical Exam  Nursing note and vitals reviewed. Constitutional: She appears well-developed and well-nourished. No distress.  HENT:  Head: Normocephalic and atraumatic.  Right Ear: External ear normal.  Left Ear: External ear normal.  Eyes: Conjunctivae normal are normal. Right eye exhibits no discharge. Left eye exhibits no discharge. No scleral icterus.  Neck: Neck supple. No tracheal deviation present.  Cardiovascular: Normal rate, regular rhythm and intact distal  pulses.   Pulmonary/Chest: Effort normal and breath sounds normal. No stridor. No respiratory distress. She has no wheezes. She has no rales.  Abdominal: Soft. Bowel sounds are normal. She exhibits no distension. There is no tenderness. There is no rebound and no guarding.  Musculoskeletal: She exhibits no edema and no tenderness.  Neurological: She is alert. She has normal strength. No sensory deficit. Cranial nerve deficit:  no gross defecits noted. She exhibits normal muscle tone. She displays no seizure activity. Coordination normal.       Normal movement bilateral upper extremities and lower extremities  Skin: Skin is warm and dry. No rash noted.  Psychiatric: She has a normal mood and affect.    ED Course  Procedures (including critical care time)  Lab results were reviewed from her previous visits. Her spinal fluid does not show any growth.  Ct Angio Head W/cm &/or Wo Cm  04/02/2012  *RADIOLOGY REPORT*  Clinical Data:  37 year old female with increasing headache. Status post lumbar puncture yesterday.  CT ANGIOGRAPHY HEAD  Technique:  Multidetector CT imaging of the head was performed using the standard protocol during bolus administration of intravenous contrast.  Multiplanar CT image reconstructions including MIPs were obtained to evaluate the vascular anatomy.  Contrast: 80mL OMNIPAQUE IOHEXOL 350 MG/ML SOLN  Comparison:  Head CT without contrast 04/01/2012 and earlier.  Findings:  A tiny focus of gas does not persist at the left CPA cistern.  Normal cerebral volume.  No ventriculomegaly. No acute intracranial hemorrhage identified.  Gray-white matter differentiation is within normal limits throughout the brain.  No evidence of cortically based acute infarction identified.  No abnormal enhancement identified.  No acute osseous abnormality identified.  Visualized paranasal sinuses and mastoids are clear.  Visualized orbits and scalp soft tissues are within normal limits.  Vascular Findings:  Major intracranial venous structures show evidence of normal enhancement.  Fairly codominant distal vertebral arteries.  Normal PICA vessels. Normal vertebrobasilar junction.  No basilar stenosis.  SCA and right PCA origins are normal.  Fetal type left PCA origin. Diminutive right posterior communicating artery also identified. Bilateral PCA branches are within normal limits.  Both ICA siphons are patent and within normal limits.  Both posterior communicating artery origins are within normal limits. Both ophthalmic artery origins are within normal limits.  Normal carotid termini.  Normal MCA origins.  Dominant right ACA A1 segment.  Normal anterior communicating artery.  ACA branches are within normal limits.  Normal MCA M1 segments.  Bilateral MCA branches are within normal limits.   Review of the MIP images confirms the above  findings.  IMPRESSION: 1.  Normal intracranial CTA. 2.  Normal CT appearance of the brain. 3.  Small focus of gas seen yesterday at the left CPA cistern does not persist and likely was related to a lumbar puncture which preceded the head CT on the same day.   Original Report Authenticated By: Erskine Speed, M.D.       MDM  I suspect the patient has a component of a spinal headache associated with the initial headache that brought her in for evaluation. Fortunately, the patient does not have evidence of any acute emergency medical condition associated with her headache. Her CAT scan does not show evidence of hemorrhage. Her CT angiogram of the head did not show evidence of aneurysm. Her spinal fluid did not suggest subarachnoid hemorrhage or infection. The patient be treated with IV fluids here and pain medications. Instructed her in the importance of taking medications at home to help with her headache.  I will also make arrangements to have the patient evaluated for a blood patch tomorrow.       Celene Kras, MD 04/03/12 1455

## 2012-04-03 NOTE — ED Notes (Signed)
Pt continues to ask for pain medication. Pt asking for the dilaudid at this time. Reminded pt to the reaction she stated she had from yesterdays dose given. Pt replied ' don't care I need it."  NAD noted pt is conversing with friend and spouse.

## 2012-04-04 ENCOUNTER — Telehealth: Payer: Self-pay | Admitting: Gastroenterology

## 2012-04-04 ENCOUNTER — Other Ambulatory Visit: Payer: Self-pay | Admitting: Emergency Medicine

## 2012-04-04 ENCOUNTER — Encounter (HOSPITAL_COMMUNITY): Admission: RE | Payer: Self-pay | Source: Ambulatory Visit

## 2012-04-04 ENCOUNTER — Ambulatory Visit (HOSPITAL_COMMUNITY): Payer: Self-pay | Admitting: Psychology

## 2012-04-04 ENCOUNTER — Ambulatory Visit (HOSPITAL_COMMUNITY)
Admission: RE | Admit: 2012-04-04 | Payer: BC Managed Care – PPO | Source: Ambulatory Visit | Admitting: Internal Medicine

## 2012-04-04 ENCOUNTER — Ambulatory Visit
Admission: RE | Admit: 2012-04-04 | Discharge: 2012-04-04 | Disposition: A | Discharge status: Home / Self Care | Payer: BC Managed Care – PPO | Source: Ambulatory Visit | Attending: Emergency Medicine | Admitting: Emergency Medicine

## 2012-04-04 VITALS — BP 134/72 | HR 74

## 2012-04-04 DIAGNOSIS — G971 Other reaction to spinal and lumbar puncture: Secondary | ICD-10-CM

## 2012-04-04 LAB — VDRL, CSF: VDRL Quant, CSF: NONREACTIVE

## 2012-04-04 SURGERY — COLONOSCOPY
Anesthesia: Moderate Sedation

## 2012-04-04 MED ORDER — IOHEXOL 180 MG/ML  SOLN
1.0000 mL | Freq: Once | INTRAMUSCULAR | Status: AC | PRN
  Administered 2012-04-04: 1 mL via EPIDURAL

## 2012-04-04 NOTE — Progress Notes (Signed)
Upon discharge headache not as severe and no more nausea noted.

## 2012-04-04 NOTE — Telephone Encounter (Signed)
Patient called and spoke to ENDO today telling them that she cant do procedure today because she is sick and she will have to R/S later

## 2012-04-04 NOTE — Progress Notes (Signed)
Pt here for blood patch post LP. Pt has had nausea and vomiting X1 upon arrival here. Discharge instructions explained and pt is resting quietly.

## 2012-04-05 ENCOUNTER — Telehealth: Payer: Self-pay | Admitting: Radiology

## 2012-04-05 LAB — CSF CULTURE
Culture: NO GROWTH
Gram Stain: NONE SEEN

## 2012-04-05 NOTE — Telephone Encounter (Signed)
Told pt she needed to stay on bedrest for the remainder of her 24 hours and if she still had a headache when she was up to go back to bed for another 24 hours. She would also need to follow up with her primary care physician.

## 2012-04-06 ENCOUNTER — Telehealth: Payer: Self-pay

## 2012-04-06 NOTE — Telephone Encounter (Signed)
Called patient in follow-up to her continued complaint of headaches after Blood Patch here 04/04/12 secondary to spinal headaches from LP on 04/01/12 at [facility].  She states her headaches are "better" but that she is having low back pain down "my sciatic nerve."  Continues to drink extra fluids and lie flat.  We discussed how the multiple LP attempts, the Blood Patch and the prolonged bed rest could all be contributing to her back pain.  She states she has a history of back pain and suspects "everything is making it flare up."  States understanding to continue bed rest and force fluids until to get rid of spinal headache, however long that may take.  She states she will follow up with her primary doctor, too.  jkl

## 2012-04-07 ENCOUNTER — Encounter (HOSPITAL_COMMUNITY): Payer: Self-pay | Admitting: *Deleted

## 2012-04-07 ENCOUNTER — Emergency Department (HOSPITAL_COMMUNITY)
Admission: EM | Admit: 2012-04-07 | Discharge: 2012-04-08 | Disposition: A | Discharge status: Home / Self Care | Payer: BC Managed Care – PPO | Attending: Emergency Medicine | Admitting: Emergency Medicine

## 2012-04-07 ENCOUNTER — Emergency Department (HOSPITAL_COMMUNITY): Payer: BC Managed Care – PPO

## 2012-04-07 DIAGNOSIS — Z87442 Personal history of urinary calculi: Secondary | ICD-10-CM | POA: Insufficient documentation

## 2012-04-07 DIAGNOSIS — Z8709 Personal history of other diseases of the respiratory system: Secondary | ICD-10-CM | POA: Insufficient documentation

## 2012-04-07 DIAGNOSIS — M2569 Stiffness of other specified joint, not elsewhere classified: Secondary | ICD-10-CM | POA: Insufficient documentation

## 2012-04-07 DIAGNOSIS — R209 Unspecified disturbances of skin sensation: Secondary | ICD-10-CM | POA: Insufficient documentation

## 2012-04-07 DIAGNOSIS — Z8719 Personal history of other diseases of the digestive system: Secondary | ICD-10-CM | POA: Insufficient documentation

## 2012-04-07 DIAGNOSIS — R51 Headache: Secondary | ICD-10-CM

## 2012-04-07 DIAGNOSIS — F411 Generalized anxiety disorder: Secondary | ICD-10-CM | POA: Insufficient documentation

## 2012-04-07 DIAGNOSIS — Z8669 Personal history of other diseases of the nervous system and sense organs: Secondary | ICD-10-CM | POA: Insufficient documentation

## 2012-04-07 DIAGNOSIS — Z79899 Other long term (current) drug therapy: Secondary | ICD-10-CM | POA: Insufficient documentation

## 2012-04-07 MED ORDER — PROMETHAZINE HCL 25 MG/ML IJ SOLN
25.0000 mg | Freq: Once | INTRAMUSCULAR | Status: AC
  Administered 2012-04-07: 25 mg via INTRAVENOUS
  Filled 2012-04-07: qty 1

## 2012-04-07 MED ORDER — SODIUM CHLORIDE 0.9 % IV SOLN
250.0000 mg | Freq: Once | INTRAVENOUS | Status: DC
  Filled 2012-04-07: qty 1

## 2012-04-07 MED ORDER — SODIUM CHLORIDE 0.9 % IV BOLUS (SEPSIS)
1000.0000 mL | Freq: Once | INTRAVENOUS | Status: AC
  Administered 2012-04-07: 1000 mL via INTRAVENOUS

## 2012-04-07 MED ORDER — KETOROLAC TROMETHAMINE 30 MG/ML IJ SOLN
30.0000 mg | Freq: Once | INTRAMUSCULAR | Status: AC
  Administered 2012-04-07: 30 mg via INTRAVENOUS
  Filled 2012-04-07: qty 1

## 2012-04-07 NOTE — ED Provider Notes (Signed)
History     CSN: [id number]  Arrival date & time 04/07/12  2440   First MD Initiated Contact with Patient 04/07/12 2055      Chief Complaint  Patient presents with  . Headache    (Consider location/radiation/quality/duration/timing/severity/associated sxs/prior treatment) HPI Comments: Patient was seen approximately 6 days ago with headache and neck stiffness and had an LP to rule out meningitis. She subsequently developed worsening headache and was deemed to have a post LP spinal headache and was arranged to have a blood patch performed on Monday. The patient reports having some improvement after the blood patch but subsequently has begun to feel worse again. She reports generalized headache worse with head movement. She also describes some numbness and tingling in the right side of her face and there is some report that she has had some difficulty with concentration and feeling "out of it." patient was taking Norco at home and more recently Ultram. She saw her primary care physician with [facility] earlier today and was referred to go back to the emergency department due to her continued symptoms. She denies any weakness. She has a remote history of Bell's palsy affecting the right side of her face. She denies any paralysis, slurred speech or drooling. She denies blurred vision. She reports her neck stiffness improved but still present. No rash or fever. She denies sinus pressure or nasal congestion.  Patient is a 37 y.o. female presenting with headaches. The history is provided by the patient, the spouse and medical records.  Headache  Pertinent negatives include no fever, no shortness of breath, no nausea and no vomiting.    Past Medical History  Diagnosis Date  . Abdominal pain   . Nausea & vomiting   . Rectal bleeding   . Dizziness   . Syncope   . Generalized headaches   . Sinus problem   . GERD (gastroesophageal reflux disease)   . Anxiety     in past, records  note bipolar disorder; however, states this was misdiagnosed  . PONV (postoperative nausea and vomiting)   . Kidney stones     Past Surgical History  Procedure Date  . Appendectomy   . Dilation and curettage of uterus   . Cholecystectomy 12/2010  . Esophagogastroduodenoscopy 03/15/2012    RMR: Small hiatal hernia. Small solitary duodenal diverticulum-status post biopsy of duodenum PATH: duodenal mucosa with intramucosal lymphoctyosis , without evidence of prominent villous blunting. Concern for partially developed sprue, severe obesity, bacteria overgrowth, H. pylori, clinical and serologic correlation recommended.     Family History  Problem Relation Age of Onset  . Hypertension Mother   . Hypertension Father   . Ulcerative colitis Maternal Aunt     UC, age 32. Colostomy for severe UC. Liver transplant mid-20s (some sort of complication/Staph infection. 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   . Irritable bowel syndrome Sister   . Colon cancer Neg Hx     History  Substance Use Topics  . Smoking status: Never Smoker   . Smokeless tobacco: Never Used  . Alcohol Use: No    OB History    Grav Para Term Preterm Abortions TAB SAB Ect Mult Living   2 2        2       Review of Systems  Constitutional: Negative for fever and chills.  HENT: Positive for neck pain. Negative for congestion, rhinorrhea and sinus pressure.   Eyes: Negative for visual disturbance.  Respiratory: Negative  for shortness of breath.   Gastrointestinal: Negative for nausea, vomiting and abdominal pain.  Musculoskeletal: Negative for back pain.  Skin: Negative for rash.  Neurological: Positive for numbness and headaches. Negative for speech difficulty and weakness.  All other systems reviewed and are negative.    Allergies  Percocet and Metoclopramide hcl  Home Medications   Current Outpatient Rx  Name  Route  Sig  Dispense  Refill  .  HYDROCODONE-ACETAMINOPHEN 5-325 MG PO TABS   Oral   Take 1 tablet by mouth every 6 (six) hours as needed. For pain   15 tablet   0   . LORAZEPAM 0.5 MG PO TABS   Oral   Take 0.5 mg by mouth daily as needed. For anxiety         . TRAMADOL HCL 50 MG PO TABS   Oral   Take 1 tablet (50 mg total) by mouth every 6 (six) hours as needed for pain.   15 tablet   0     BP 106/54  Pulse 77  Temp 98.2 F (36.8 C) (Oral)  Resp 15  SpO2 100%  LMP 04/04/2012  Physical Exam  Nursing note and vitals reviewed. Constitutional: She is oriented to person, place, and time. She appears well-developed and well-nourished. No distress.  HENT:  Head: Normocephalic and atraumatic.  Eyes: Pupils are equal, round, and reactive to light.  Neck: Normal range of motion. Neck supple.  Cardiovascular: Normal rate.   Pulmonary/Chest: Effort normal. No respiratory distress.  Abdominal: Soft. She exhibits no distension. There is no tenderness.  Neurological: She is alert and oriented to person, place, and time. No cranial nerve deficit. Coordination normal.  Skin: Skin is warm and dry. No rash noted. She is not diaphoretic.    ED Course  Procedures (including critical care time)  Labs Reviewed - No data to display Ct Head Wo Contrast  04/07/2012  *RADIOLOGY REPORT*  Clinical Data: 37 year old female with severe headache.  Blood patch performed 3 days ago.  CT HEAD WITHOUT CONTRAST  Technique:  Contiguous axial images were obtained from the base of the skull through the vertex without contrast.  Comparison: 04/02/2012 and prior CTs  Findings: No intracranial abnormalities are identified, including mass lesion or mass effect, hydrocephalus, extra-axial fluid collection, midline shift, hemorrhage, or acute infarction.  The visualized bony calvarium is unremarkable.  IMPRESSION: Unremarkable noncontrast head CT.   Original Report Authenticated By: Harmon Pier, M.D.      1. Headache     Room air saturation  is 98% and normal by my interpretation.   11:08 PM Caffeine is unavailable.  HA cocktail given along with IVF's.    12:06 AM Pt feels improved, is requesting to go home.    MDM   Nonfocal neurologic exam at present. Patient's LP was 6 days out, I doubt this is a post LP headache. Certainly it is possible the patient is having a migraine headache and may still have some residual discomfort from her LP. My plan is to give IV fluids, IV caffeine as well as a headache cocktail. She had allergic reaction to Reglan but she has had Phenergan in the past. Also give IV Toradol. Head CT noncontrast was performed by protocol and results shows that it is normal.  Otherwise I recommend that the patient followup with her primary care physician and receive referral to neurology for further eval as necessary.        Gavin Pound. Oletta Lamas, MD 04/08/12 4401

## 2012-04-07 NOTE — ED Notes (Signed)
Pt was sent here by her pcp at [facility] for continued headaches after blood patch placed on Monday.  Pt required blood patch d/t lumbar puncture she received to r/o meningitis at [facility] last Friday.  The blood patch initially decreased pt pain, but last night pain increased again and it is the worst it has been so far.  Pt denies blurred vision, but pt feels "slow" and "out of it" and husband agrees pt is "slower than her normal self".  Pt ao x 4.  Also c/o numbness/tingling to R side of head.

## 2012-04-08 ENCOUNTER — Telehealth: Payer: Self-pay

## 2012-04-08 ENCOUNTER — Ambulatory Visit (HOSPITAL_COMMUNITY): Payer: Self-pay | Admitting: Psychology

## 2012-04-08 NOTE — Discharge Instructions (Signed)
General Headache Without Cause A headache is pain or discomfort felt around the head or neck area. The specific cause of a headache may not be found. There are many causes and types of headaches. A few common ones are:  Tension headaches.  Migraine headaches.  Cluster headaches.  Chronic daily headaches. HOME CARE INSTRUCTIONS   Keep all follow-up appointments with your caregiver or any specialist referral.  Only take over-the-counter or prescription medicines for pain or discomfort as directed by your caregiver.  Lie down in a dark, quiet room when you have a headache.  Keep a headache journal to find out what may trigger your migraine headaches. For example, write down:  What you eat and drink.  How much sleep you get.  Any change to your diet or medicines.  Try massage or other relaxation techniques.  Put ice packs or heat on the head and neck. Use these 3 to 4 times per day for 15 to 20 minutes each time, or as needed.  Limit stress.  Sit up straight, and do not tense your muscles.  Quit smoking if you smoke.  Limit alcohol use.  Decrease the amount of caffeine you drink, or stop drinking caffeine.  Eat and sleep on a regular schedule.  Get 7 to 9 hours of sleep, or as recommended by your caregiver.  Keep lights dim if bright lights bother you and make your headaches worse. SEEK MEDICAL CARE IF:   You have problems with the medicines you were prescribed.  Your medicines are not working.  You have a change from the usual headache.  You have nausea or vomiting. SEEK IMMEDIATE MEDICAL CARE IF:   Your headache becomes severe.  You have a fever.  You have a stiff neck.  You have loss of vision.  You have muscular weakness or loss of muscle control.  You start losing your balance or have trouble walking.  You feel faint or pass out.  You have severe symptoms that are different from your first symptoms. MAKE SURE YOU:   Understand these  instructions.  Will watch your condition.  Will get help right away if you are not doing well or get worse. Document Released: 02/23/2005 Document Revised: 05/18/2011 Document Reviewed: 03/11/2011 Exit[employer] Patient Information 2013 [employer].  

## 2012-04-08 NOTE — Progress Notes (Addendum)
Returned call to [name] with [facility] [phone] who called [place] 04/07/12 @ 1608 regarding [name] who told them we told [name] to request order for a second Epidural Blood Patch.  I told [name] I specifically told [name] yesterday morning that we almost NEVER do a second EBP, especially since she stated her frontal headache was "gone after lying in bed again Wednesday (04/06/12).   I told [name] that [name]'s only complaint to me yesterday (04/07/12) was that her back was sore.  She stated she had pre-existing back problems, and I told her that the past 5-6 days of bedrest certainly could exacerbate her problems.  Also, the EBP involves injecting about 20cc of blood into her lower back area which certainly can cause some aching. [name] stated she would disregard order for second Blood Patch.  jkl

## 2012-04-08 NOTE — ED Notes (Signed)
PT comfortable with discharge instructions.

## 2012-04-23 ENCOUNTER — Other Ambulatory Visit: Payer: Self-pay

## 2012-05-04 ENCOUNTER — Ambulatory Visit (HOSPITAL_COMMUNITY): Payer: BC Managed Care – PPO | Admitting: Psychology

## 2012-05-05 ENCOUNTER — Ambulatory Visit (INDEPENDENT_AMBULATORY_CARE_PROVIDER_SITE_OTHER): Payer: BC Managed Care – PPO | Admitting: Psychology

## 2012-05-05 DIAGNOSIS — F419 Anxiety disorder, unspecified: Secondary | ICD-10-CM

## 2012-05-05 DIAGNOSIS — F4001 Agoraphobia with panic disorder: Secondary | ICD-10-CM

## 2012-05-05 DIAGNOSIS — F411 Generalized anxiety disorder: Secondary | ICD-10-CM

## 2012-05-05 DIAGNOSIS — F429 Obsessive-compulsive disorder, unspecified: Secondary | ICD-10-CM

## 2012-05-09 ENCOUNTER — Encounter (HOSPITAL_COMMUNITY): Payer: Self-pay | Admitting: Psychology

## 2012-05-09 NOTE — Progress Notes (Signed)
Patient:  [name]   DOB: Mar 24, 1975  MR Number: [id number]  Location: [facility] [address] [place] [address] Dept: [phone]  Start: 1 PM End: 2 PM  Provider/Observer:     Hershal Coria PSYD  Chief Complaint:      Chief Complaint  Patient presents with  . Anxiety  . Agitation  . Stress    Reason For Service:     The patient reports that she started having panic attacks during her pregnancy and that her son is now 82 months old. The patient reports that she is often getting them during her menstrual cycle and that not only does she have these panic attacks she also gets significant headaches. The patient reports that prior to her pregnancy she did not have problems with her menstrual cycle. The patient reports that she is major stressors right now have to do with her family situation. The patient reports that other stressors also the ability to trigger panic attacks various times. This is been going on for one year. The patient reports that when these events happen is very scary and she feels a the symptoms would never want go away and that she desperately wants her "old life back." The patient was treated for depression back in 2006 and treated with Luvox for OCD and symptoms of depression.  Interventions Strategy:  Cognitive/behavioral psychotherapeutic interventions  Participation Level:   Active  Participation Quality:  Appropriate      Behavioral Observation:  Well Groomed, Alert, and Appropriate.   Current Psychosocial Factors: The patient reports that she has started taking the Lexapro that I prescribed to her. We talked about this medication as she has been quite concerned about taking any type of medication but reports that she thinks it does help some. She has had some side effects but overall appears to be helping..  Content of Session:   Reviewed current symptoms  and continued work on therapeutic interventions for issues related to recurrent panic attacks.  Current Status:   The patient reports that she has not been repeatedly checking on the status of her husband threw her phone tracking device and is actively working on therapeutic interventions are in her compulsions..  Patient Progress:   Stable  Target Goals:   Target goals include reducing frequency, intensity, and duration of panic attacks and reducing the overall level of anxiety including feelings of tension, fear, and worry.  Last Reviewed:   05/05/2012  Goals Addressed Today:    Today we worked on issues related to therapeutic interventions for panic attacks.  Impression/Diagnosis:   The patient has a history of prior depression back in 2006 as well as OCD and other anxiety symptoms. When she was pregnant with her son who is now nearly 69-year-old she began developing panic attacks and these have persisted with her menstrual cycles primarily as well as headaches.  Diagnosis:    Axis I:  Panic disorder with agoraphobia  Obsessive compulsive disorder  Anxiety      Axis II: No diagnosis

## 2012-05-19 ENCOUNTER — Ambulatory Visit (HOSPITAL_COMMUNITY): Payer: Self-pay | Admitting: Psychology

## 2012-05-30 ENCOUNTER — Other Ambulatory Visit: Payer: Self-pay

## 2012-05-30 MED ORDER — GENTAMICIN SULFATE 0.3 % OP SOLN: 2.0000 [drp] | Freq: Four times a day (QID) | OPHTHALMIC | Status: AC

## 2012-08-29 ENCOUNTER — Telehealth: Payer: Self-pay | Admitting: Family Medicine

## 2012-08-29 NOTE — Telephone Encounter (Signed)
Is on vacation in [facility] and has sinus infection.  Has purchased otc meds and they are not helping.  Could you call in z-pack.  Is blowing green and has a lot of sinus pressure.  Using saline mist and Mucinex D. Will not be back in town until June 30th.  Could you please call in meds to [facility] [phone]

## 2012-08-29 NOTE — Telephone Encounter (Signed)
zpk

## 2012-08-29 NOTE — Telephone Encounter (Signed)
RX called into [facility] at number listed below. Patient was notified.

## 2012-09-15 ENCOUNTER — Encounter: Payer: Self-pay | Admitting: Family Medicine

## 2012-09-15 ENCOUNTER — Ambulatory Visit (INDEPENDENT_AMBULATORY_CARE_PROVIDER_SITE_OTHER): Payer: BC Managed Care – PPO | Admitting: Family Medicine

## 2012-09-15 VITALS — BP 110/70 | Temp 98.7°F | Wt 239.4 lb

## 2012-09-15 DIAGNOSIS — J329 Chronic sinusitis, unspecified: Secondary | ICD-10-CM

## 2012-09-15 DIAGNOSIS — R300 Dysuria: Secondary | ICD-10-CM

## 2012-09-15 LAB — POCT URINALYSIS DIPSTICK: Spec Grav, UA: 1.02

## 2012-09-15 MED ORDER — LEVOFLOXACIN 500 MG PO TABS: 500.0000 mg | ORAL_TABLET | Freq: Every day | ORAL | Status: AC

## 2012-09-15 NOTE — Progress Notes (Signed)
  Subjective:    Patient ID: [name], female    DOB: 12-09-1975, 37 y.o.   MRN: [id number]  Sinusitis This is a new problem. The current episode started 1 to 4 weeks ago. The problem has been gradually worsening since onset. There has been no fever. The fever has been present for less than 1 day. Her pain is at a severity of 6/10. The pain is moderate. Associated symptoms include congestion, coughing and headaches. Past treatments include antibiotics (took z pk helped a little). The treatment provided mild relief.  Dysuria    Results for orders placed in visit on 09/15/12  POCT URINALYSIS DIPSTICK      Result Value Range   Color, UA       Clarity, UA       Glucose, UA       Bilirubin, UA       Ketones, UA       Spec Grav, UA 1.020     Blood, UA       pH, UA 6.0     Protein, UA       Urobilinogen, UA       Nitrite, UA       Leukocytes, UA small (1+)        Review of Systems  HENT: Positive for congestion.   Respiratory: Positive for cough.   Genitourinary: Positive for dysuria.  Neurological: Positive for headaches.       Objective:   Physical Exam  alert and no acute distress. HEENT moderate nasal congestion frontal tenderness. Pharynx slight erythema neck supple. Lungs clear. Heart regular rate and rhythm. Urinalysis unremarkable.      Assessment & Plan:  Impression subacute sinusitis with early urinary symptoms but no evidence of urinary tract infection on analysis. Plan Levaquin daily for 10 days. At the end of the visit patient mentioned that she has been started on Lexapro by her OB/GYN. I advised a separate assessment so we could discuss that further. WSL

## 2012-10-05 ENCOUNTER — Ambulatory Visit: Payer: BC Managed Care – PPO | Admitting: Family Medicine

## 2012-12-03 ENCOUNTER — Emergency Department (HOSPITAL_COMMUNITY)
Admission: EM | Admit: 2012-12-03 | Discharge: 2012-12-03 | Disposition: A | Discharge status: Home / Self Care | Payer: BC Managed Care – PPO | Attending: Emergency Medicine | Admitting: Emergency Medicine

## 2012-12-03 ENCOUNTER — Encounter (HOSPITAL_COMMUNITY): Payer: Self-pay

## 2012-12-03 DIAGNOSIS — K5289 Other specified noninfective gastroenteritis and colitis: Secondary | ICD-10-CM | POA: Insufficient documentation

## 2012-12-03 DIAGNOSIS — F411 Generalized anxiety disorder: Secondary | ICD-10-CM | POA: Insufficient documentation

## 2012-12-03 DIAGNOSIS — K529 Noninfective gastroenteritis and colitis, unspecified: Secondary | ICD-10-CM

## 2012-12-03 DIAGNOSIS — Z9089 Acquired absence of other organs: Secondary | ICD-10-CM | POA: Insufficient documentation

## 2012-12-03 DIAGNOSIS — Z9889 Other specified postprocedural states: Secondary | ICD-10-CM | POA: Insufficient documentation

## 2012-12-03 DIAGNOSIS — Z87442 Personal history of urinary calculi: Secondary | ICD-10-CM | POA: Insufficient documentation

## 2012-12-03 DIAGNOSIS — Z3202 Encounter for pregnancy test, result negative: Secondary | ICD-10-CM | POA: Insufficient documentation

## 2012-12-03 LAB — COMPREHENSIVE METABOLIC PANEL
ALT: 14 U/L (ref 0–35)
CO2: 26 mEq/L (ref 19–32)
Calcium: 9.3 mg/dL (ref 8.4–10.5)
Creatinine, Ser: 0.89 mg/dL (ref 0.50–1.10)
GFR calc Af Amer: >90 mL/min (ref >90)
GFR calc non Af Amer: 82 mL/min — ABNORMAL LOW (ref >90)
Glucose, Bld: 104 mg/dL — ABNORMAL HIGH (ref 70–99)
Sodium: 137 mEq/L (ref 135–145)
Total Protein: 6.9 g/dL (ref 6.0–8.3)

## 2012-12-03 LAB — CBC WITH DIFFERENTIAL/PLATELET
Eosinophils Absolute: 0.1 10*3/uL (ref 0.0–0.7)
Eosinophils Relative: 1 % (ref 0–5)
HCT: 41.2 % (ref 36.0–46.0)
Lymphocytes Relative: 15 % (ref 12–46)
Lymphs Abs: 1.2 10*3/uL (ref 0.7–4.0)
MCH: 28.5 pg (ref 26.0–34.0)
MCV: 86.4 fL (ref 78.0–100.0)
Monocytes Absolute: 0.7 10*3/uL (ref 0.1–1.0)
Platelets: 227 10*3/uL (ref 150–400)
RBC: 4.77 MIL/uL (ref 3.87–5.11)
RDW: 12.8 % (ref 11.5–15.5)

## 2012-12-03 LAB — URINALYSIS, ROUTINE W REFLEX MICROSCOPIC
Leukocytes, UA: NEGATIVE
Nitrite: NEGATIVE
Protein, ur: NEGATIVE mg/dL
Specific Gravity, Urine: 1.025 (ref 1.005–1.030)
Urobilinogen, UA: 0.2 mg/dL (ref 0.0–1.0)

## 2012-12-03 LAB — LIPASE, BLOOD: Lipase: 17 U/L (ref 11–59)

## 2012-12-03 LAB — PREGNANCY, URINE: Preg Test, Ur: NEGATIVE

## 2012-12-03 MED ORDER — ONDANSETRON 8 MG PO TBDP: 8.0000 mg | ORAL_TABLET | Freq: Three times a day (TID) | ORAL | Status: DC | PRN

## 2012-12-03 MED ORDER — ONDANSETRON HCL 4 MG/2ML IJ SOLN
4.0000 mg | Freq: Once | INTRAMUSCULAR | Status: AC
  Administered 2012-12-03: 4 mg via INTRAVENOUS
  Filled 2012-12-03: qty 2

## 2012-12-03 MED ORDER — FENTANYL CITRATE 0.05 MG/ML IJ SOLN
100.0000 ug | Freq: Once | INTRAMUSCULAR | Status: AC
  Administered 2012-12-03: 100 ug via INTRAVENOUS
  Filled 2012-12-03: qty 2

## 2012-12-03 MED ORDER — DIPHENOXYLATE-ATROPINE 2.5-0.025 MG PO TABS: 1.0000 | ORAL_TABLET | Freq: Four times a day (QID) | ORAL | Status: DC | PRN

## 2012-12-03 NOTE — ED Notes (Signed)
Pt c/o upper abd pain since last night, vomited x 1 and has had some diarrhea.

## 2012-12-03 NOTE — ED Provider Notes (Signed)
CSN: [id number]     Arrival date & time 12/03/12  5621 History   First MD Initiated Contact with Patient 12/03/12 [phone]     Chief Complaint  Patient presents with  . Abdominal Pain   (Consider location/radiation/quality/duration/timing/severity/associated sxs/prior Treatment) HPI This is a 37 year old female who developed some vague epigastric pain yesterday afternoon about 3 PM. She was able to eat dinner without difficulty about 6 PM. Over the subsequent 2 hours she developed worsening epigastric pain which became moderate to severe. She describes the pain as cramping. The pain is not worse with movement or palpation. It was accompanied by one episode of vomiting as well as diarrhea. She describes the diarrhea is watery and yellow without blood or mucus. She has not been on antibiotics recently. She was given 4 mg of Zofran and 100 mcg of fentanyl for evaluation with improvement in her symptoms.  Past Medical History  Diagnosis Date  . Abdominal pain   . Nausea & vomiting   . Rectal bleeding   . Dizziness   . Syncope   . Generalized headaches   . Sinus problem   . GERD (gastroesophageal reflux disease)   . Anxiety     in past, records note bipolar disorder; however, states this was misdiagnosed  . PONV (postoperative nausea and vomiting)   . Kidney stones    Past Surgical History  Procedure Laterality Date  . Appendectomy    . Dilation and curettage of uterus    . Cholecystectomy  12/2010  . Esophagogastroduodenoscopy  03/15/2012    RMR: Small hiatal hernia. Small solitary duodenal diverticulum-status post biopsy of duodenum PATH: duodenal mucosa with intramucosal lymphoctyosis , without evidence of prominent villous blunting. Concern for partially developed sprue, severe obesity, bacteria overgrowth, H. pylori, clinical and serologic correlation recommended.    Family History  Problem Relation Age of Onset  . Hypertension Mother   . Hypertension Father   . Ulcerative colitis  Maternal Aunt     UC, age 61. Colostomy for severe UC. Liver transplant mid-20s (some sort of complication/Staph infection. 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   . Irritable bowel syndrome Sister   . Colon cancer Neg Hx    History  Substance Use Topics  . Smoking status: Never Smoker   . Smokeless tobacco: Never Used  . Alcohol Use: No   OB History   Grav Para Term Preterm Abortions TAB SAB Ect Mult Living   2 2        2      Review of Systems  All other systems reviewed and are negative.    Allergies  Percocet and Metoclopramide hcl  Home Medications   Current Outpatient Rx  Name  Route  Sig  Dispense  Refill  . escitalopram (LEXAPRO) 10 MG tablet   Oral   Take 10 mg by mouth daily.         Marland Kitchen LORazepam (ATIVAN) 0.5 MG tablet   Oral   Take 0.5 mg by mouth daily as needed. For anxiety          BP 121/67  Pulse 99  Temp(Src) 98.1 F (36.7 C) (Oral)  Resp 16  Ht 5\' 7"  (1.702 m)  Wt 228 lb (103.42 kg)  BMI 35.7 kg/m2  SpO2 99%  LMP 11/19/2012  Physical Exam General: Well-developed, well-nourished female in no acute distress; appearance consistent with age of record HENT: normocephalic; atraumatic Eyes: pupils equal, round and reactive to light; extraocular muscles intact Neck:  supple Heart: regular rate and rhythm Lungs: clear to auscultation bilaterally Abdomen: soft; nondistended; nontender; no masses or hepatosplenomegaly; bowel sounds present Extremities: No deformity; full range of motion; pulses normal Neurologic: Awake, alert and oriented; motor function intact in all extremities and symmetric; no facial droop Skin: Warm and dry Psychiatric: Normal mood and affect    ED Course  Procedures (including critical care time)   MDM   Nursing notes and vitals signs, including pulse oximetry, reviewed.  Summary of this visit's results, reviewed by myself:  Labs:  Results for orders placed during the  hospital encounter of 12/03/12 (from the past 24 hour(s))  URINALYSIS, ROUTINE W REFLEX MICROSCOPIC     Status: None   Collection Time    12/03/12  4:17 AM      Result Value Range   Color, Urine YELLOW  YELLOW   APPearance CLEAR  CLEAR   Specific Gravity, Urine 1.025  1.005 - 1.030   pH 6.5  5.0 - 8.0   Glucose, UA NEGATIVE  NEGATIVE mg/dL   Hgb urine dipstick NEGATIVE  NEGATIVE   Bilirubin Urine NEGATIVE  NEGATIVE   Ketones, ur NEGATIVE  NEGATIVE mg/dL   Protein, ur NEGATIVE  NEGATIVE mg/dL   Urobilinogen, UA 0.2  0.0 - 1.0 mg/dL   Nitrite NEGATIVE  NEGATIVE   Leukocytes, UA NEGATIVE  NEGATIVE  PREGNANCY, URINE     Status: None   Collection Time    12/03/12  4:17 AM      Result Value Range   Preg Test, Ur NEGATIVE  NEGATIVE  CBC WITH DIFFERENTIAL     Status: None   Collection Time    12/03/12  4:30 AM      Result Value Range   WBC 8.5  4.0 - 10.5 K/uL   RBC 4.77  3.87 - 5.11 MIL/uL   Hemoglobin 13.6  12.0 - 15.0 g/dL   HCT 78.2  95.6 - 21.3 %   MCV 86.4  78.0 - 100.0 fL   MCH 28.5  26.0 - 34.0 pg   MCHC 33.0  30.0 - 36.0 g/dL   RDW 08.6  57.8 - 46.9 %   Platelets 227  150 - 400 K/uL   Neutrophils Relative % 76  43 - 77 %   Neutro Abs 6.5  1.7 - 7.7 K/uL   Lymphocytes Relative 15  12 - 46 %   Lymphs Abs 1.2  0.7 - 4.0 K/uL   Monocytes Relative 8  3 - 12 %   Monocytes Absolute 0.7  0.1 - 1.0 K/uL   Eosinophils Relative 1  0 - 5 %   Eosinophils Absolute 0.1  0.0 - 0.7 K/uL   Basophils Relative 0  0 - 1 %   Basophils Absolute 0.0  0.0 - 0.1 K/uL  COMPREHENSIVE METABOLIC PANEL     Status: Abnormal   Collection Time    12/03/12  4:30 AM      Result Value Range   Sodium 137  135 - 145 mEq/L   Potassium 3.7  3.5 - 5.1 mEq/L   Chloride 102  96 - 112 mEq/L   CO2 26  19 - 32 mEq/L   Glucose, Bld 104 (*) 70 - 99 mg/dL   BUN 11  6 - 23 mg/dL   Creatinine, Ser 6.29  0.50 - 1.10 mg/dL   Calcium 9.3  8.4 - 52.8 mg/dL   Total Protein 6.9  6.0 - 8.3 g/dL   Albumin 3.6  3.5 -  5.2 g/dL   AST 14  0 - 37 U/L   ALT 14  0 - 35 U/L   Alkaline Phosphatase 77  39 - 117 U/L   Total Bilirubin 0.3  0.3 - 1.2 mg/dL   GFR calc non Af Amer 82 (*) >90 mL/min   GFR calc Af Amer >90  >90 mL/min  LIPASE, BLOOD     Status: None   Collection Time    12/03/12  4:30 AM      Result Value Range   Lipase 17  11 - 59 U/L   5:45 AM Given the absence of laboratory abnormalities and no tenderness or distention on exam this likely represents a gastroenteritis. The possibility of the CT scan was discussed with the patient and she agreed that the risks outweigh the benefit at this time but that should she worsen she should return for reevaluation and consideration of a CT scan. In the meantime we will treat her for gastroenteritis.     Hanley Seamen, MD 12/03/12 [phone]

## 2013-01-12 ENCOUNTER — Other Ambulatory Visit: Payer: Self-pay

## 2013-02-01 ENCOUNTER — Ambulatory Visit: Payer: BC Managed Care – PPO | Admitting: Family Medicine

## 2013-02-03 ENCOUNTER — Ambulatory Visit (INDEPENDENT_AMBULATORY_CARE_PROVIDER_SITE_OTHER): Payer: BC Managed Care – PPO | Admitting: Family Medicine

## 2013-02-03 ENCOUNTER — Encounter: Payer: Self-pay | Admitting: Family Medicine

## 2013-02-03 VITALS — BP 122/84 | Temp 98.5°F | Ht 67.0 in | Wt 261.4 lb

## 2013-02-03 DIAGNOSIS — J329 Chronic sinusitis, unspecified: Secondary | ICD-10-CM

## 2013-02-03 MED ORDER — PREDNISONE (PAK) 10 MG PO TABS: ORAL_TABLET | Freq: Every day | ORAL | Status: DC

## 2013-02-03 MED ORDER — CEFDINIR 300 MG PO CAPS: ORAL_CAPSULE | ORAL | Status: AC

## 2013-02-03 NOTE — Progress Notes (Signed)
   Subjective:    Patient ID: [name], female    DOB: December 08, 1975, 37 y.o.   MRN: [id number]  Sinusitis This is a new problem. The current episode started 1 to 4 weeks ago. Associated symptoms include congestion, ear pain, headaches, a hoarse voice and sinus pressure. (Dizziness) Past treatments include saline sprays and oral decongestants. The treatment provided mild relief.    Uses claritin, sig hx of allergies  Started as sniffles, cong and drainage, took tyle and saline spary  Also felt it going into the chest mucinex helped  Right sided headache tightness and pain persists Burning sens, Yell gunky disch from right side  Review of Systems  HENT: Positive for congestion, ear pain, hoarse voice and sinus pressure.   Neurological: Positive for headaches.       Objective:   Physical Exam Alert moderate malaise. Right frontal and maxillary tenderness. Right TM retracted. Pharynx slightly erythematous left TM normal. Lungs clear. Heart regular in rhythm. Vital stable.       Assessment & Plan:  Impression 1 rhinosinusitis discussed. Likely initiated by allergy. Patient found steroids help this in the past. Plan Omnicef twice a day for 14 days. Prednisone taper. Local measures discussed. WSL

## 2013-05-01 ENCOUNTER — Telehealth: Payer: Self-pay | Admitting: Family Medicine

## 2013-05-01 MED ORDER — GENTAMICIN SULFATE 0.3 % OP SOLN: OPHTHALMIC | Status: AC

## 2013-05-01 NOTE — Telephone Encounter (Signed)
Medication was sent to pharmacy. Patient was notified.  

## 2013-05-01 NOTE — Telephone Encounter (Signed)
Patient has pink eye and would like something called in.    [facility]

## 2013-05-01 NOTE — Telephone Encounter (Signed)
Garamycin two drops qid affected eye 5 d 

## 2013-05-09 ENCOUNTER — Ambulatory Visit: Payer: BC Managed Care – PPO | Admitting: Family Medicine

## 2013-05-15 ENCOUNTER — Ambulatory Visit: Payer: BC Managed Care – PPO | Admitting: Family Medicine

## 2013-05-16 ENCOUNTER — Ambulatory Visit (INDEPENDENT_AMBULATORY_CARE_PROVIDER_SITE_OTHER): Payer: BC Managed Care – PPO | Admitting: Family Medicine

## 2013-05-16 ENCOUNTER — Encounter: Payer: Self-pay | Admitting: Family Medicine

## 2013-05-16 VITALS — BP 130/82 | Temp 99.0°F | Ht 67.0 in | Wt 262.8 lb

## 2013-05-16 DIAGNOSIS — J329 Chronic sinusitis, unspecified: Secondary | ICD-10-CM

## 2013-05-16 MED ORDER — PREDNISONE 10 MG PO TABS: ORAL_TABLET | ORAL | Status: DC

## 2013-05-16 MED ORDER — LEVOFLOXACIN 500 MG PO TABS: 500.0000 mg | ORAL_TABLET | Freq: Every day | ORAL | Status: DC

## 2013-05-16 NOTE — Progress Notes (Signed)
   Subjective:    Patient ID: [name], female    DOB: [birth year], 38 y.o.   MRN: [id number]  Conjunctivitis  The current episode started more than 1 week ago. Associated symptoms include congestion, ear pain and headaches.   Congestion and drainage  Sore throat  Duration two wks  Headache frontal  Ear huritng at times  Pink eye left eye  Throat sore in the morning    Review of Systems  HENT: Positive for congestion and ear pain.   Neurological: Positive for headaches.       Objective:   Physical Exam  Alert moderate malaise. HEENT moderate nasal congestion. Frontal tenderness. Tie irritated neck supple lungs clear. Heart regular rate rhythm      Assessment & Plan:  Impression acute rhinosinusitis with secondary conjunctivitis plan antibiotics prescribed. Symptomatic care discussed. Warning signs discussed. WSL

## 2013-05-22 ENCOUNTER — Telehealth: Payer: Self-pay | Admitting: Family Medicine

## 2013-05-22 MED ORDER — CLARITHROMYCIN 500 MG PO TABS: 500.0000 mg | ORAL_TABLET | Freq: Two times a day (BID) | ORAL | Status: DC

## 2013-05-22 NOTE — Telephone Encounter (Signed)
Patient is still on levaquin and prednisone. She feels like its not clearing up her congestion and she has a sore throat. Please advise.   Would also like a work excuse for yesterday and today, to return tomorrow.

## 2013-05-22 NOTE — Telephone Encounter (Signed)
Change abx to biaxin 500 bid ten d 

## 2013-05-22 NOTE — Telephone Encounter (Signed)
Rx sent electronically to pharmacy. Patient notified. Patient will pick up work note tomm.

## 2013-05-23 ENCOUNTER — Encounter: Payer: Self-pay | Admitting: Family Medicine

## 2013-05-23 NOTE — Telephone Encounter (Signed)
Work excuse complete. °

## 2013-08-18 IMAGING — CT CT HEAD W/O CM
1 series · 16 of 30 positions shown, 20 images · non-contrast
Comparison: None.

CLINICAL DATA: Headache

CT HEAD WITHOUT CONTRAST
TECHNIQUE: Contiguous axial images were obtained from the base of
the skull through the vertex without contrast.

[Series 2: headseq 4.8 h37s · axial · 0.43mm/px · z∈[+49,+180]mm · 16 of 30 slices shown, 20 images]
[im 2/30  brain]
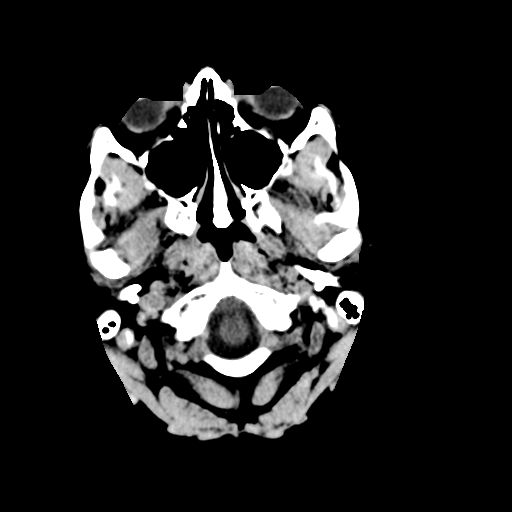
[im 2/30  bone]
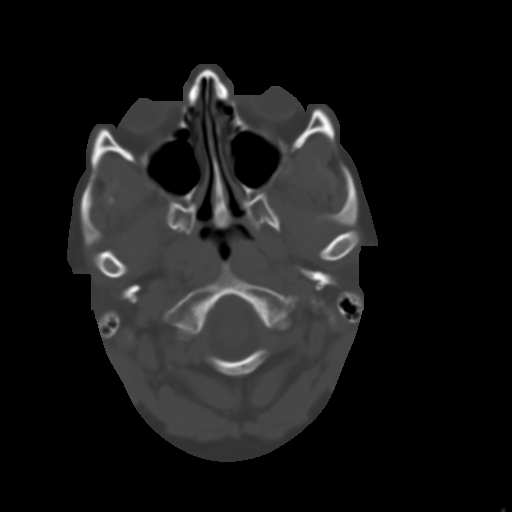
[im 4/30  brain]
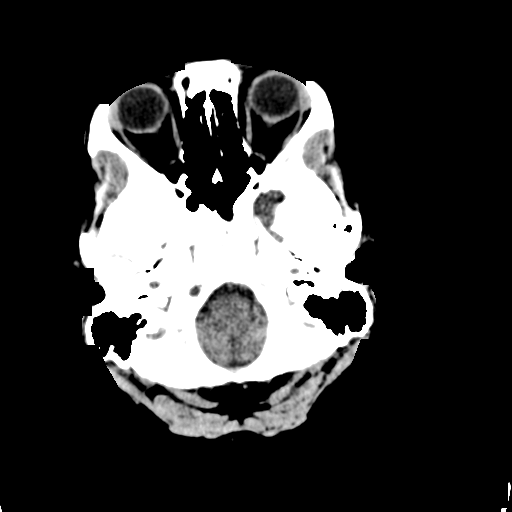
[im 6/30  brain]
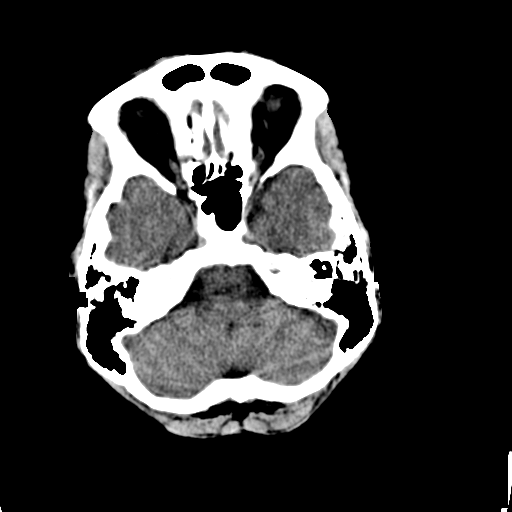
[im 8/30  brain]
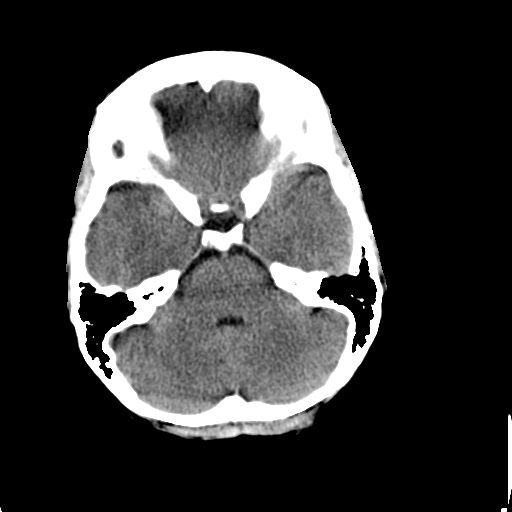
[im 9/30  brain]
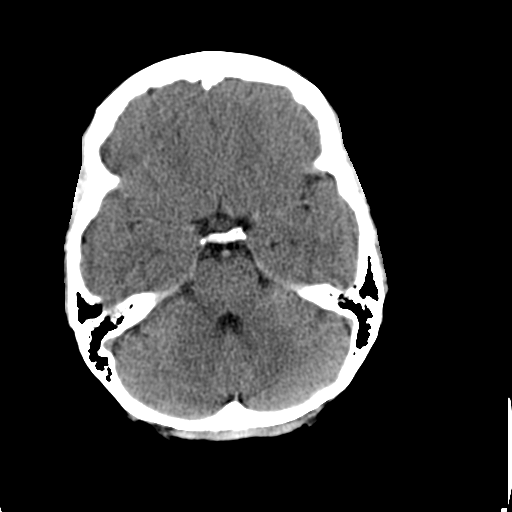
[im 9/30  bone]
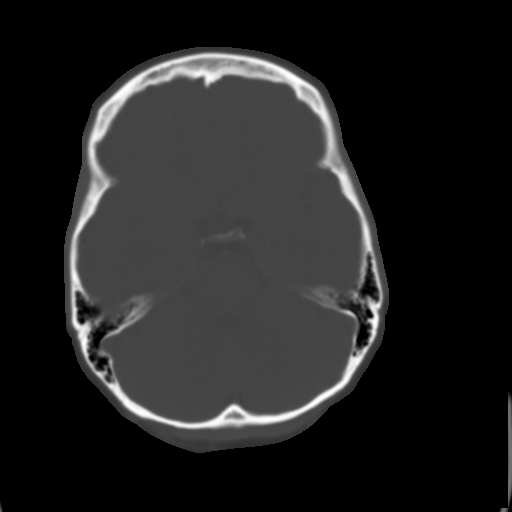
[im 11/30  brain]
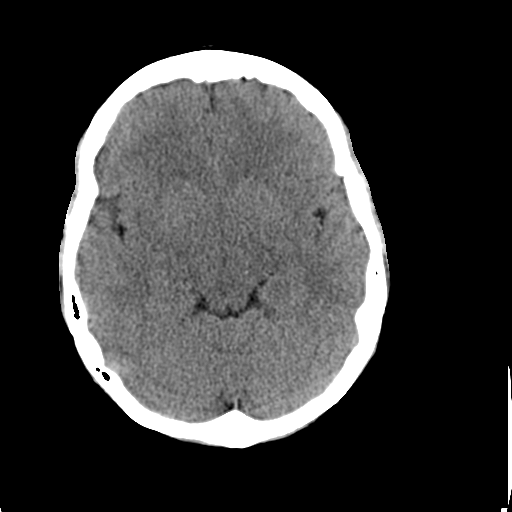
[im 13/30  brain]
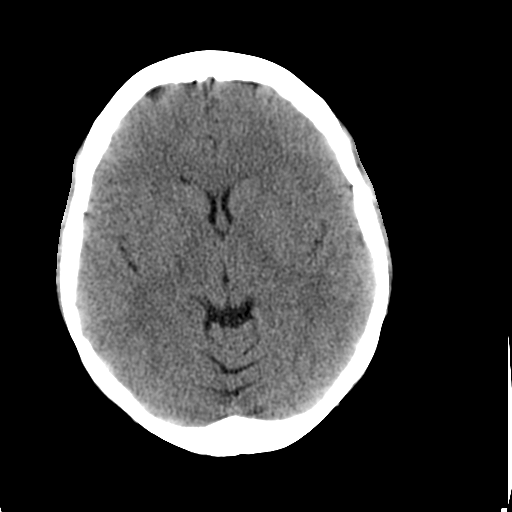
[im 15/30  brain]
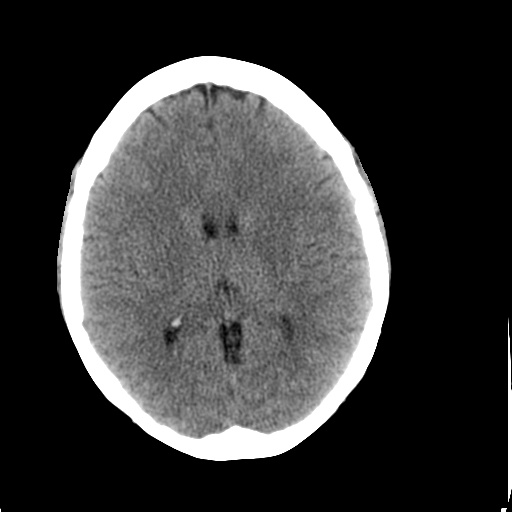
[im 16/30  brain]
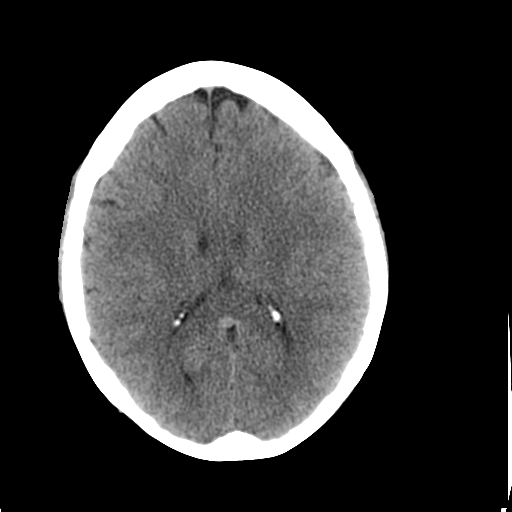
[im 16/30  bone]
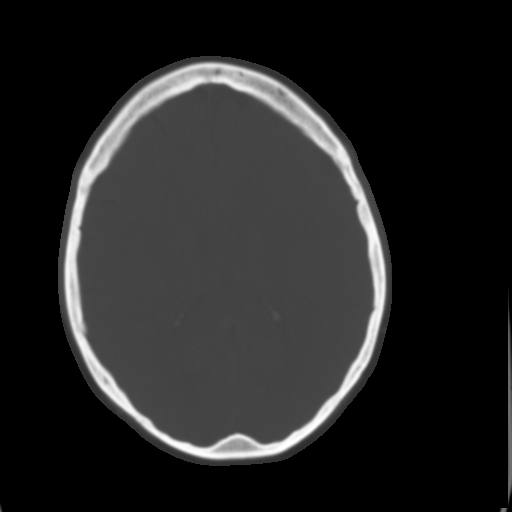
[im 18/30  brain]
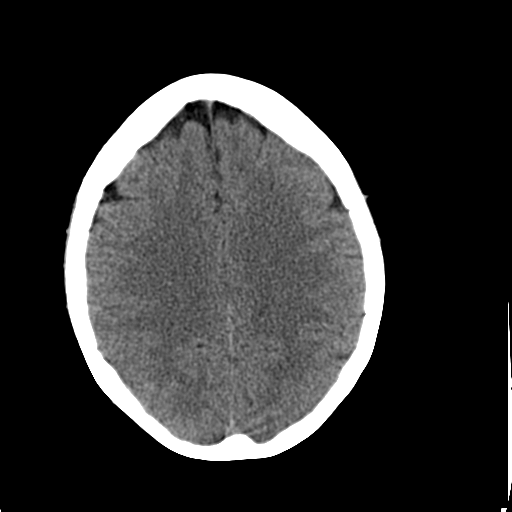
[im 20/30  brain]
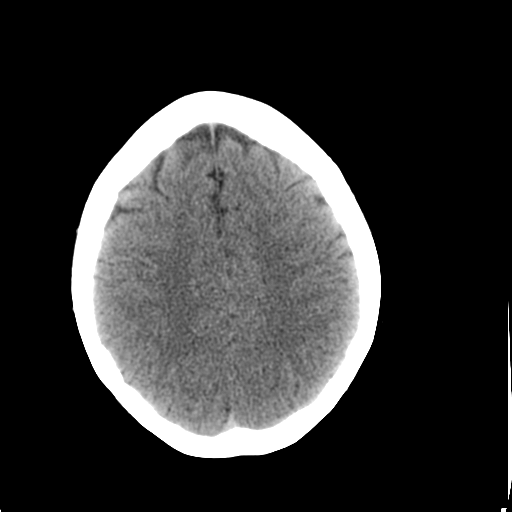
[im 22/30  brain]
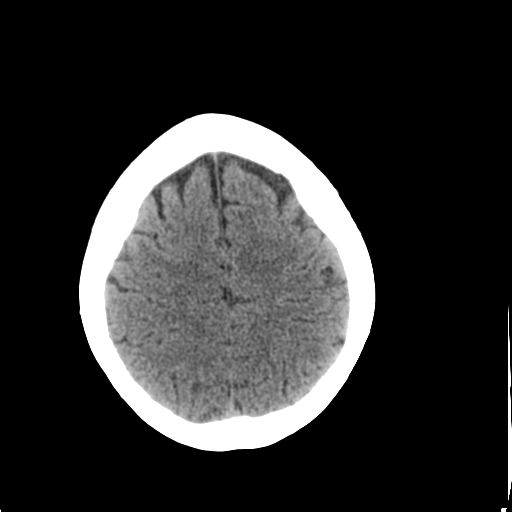
[im 23/30  brain]
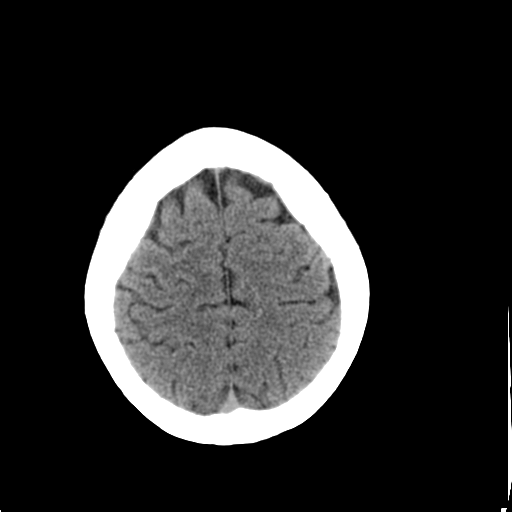
[im 23/30  bone]
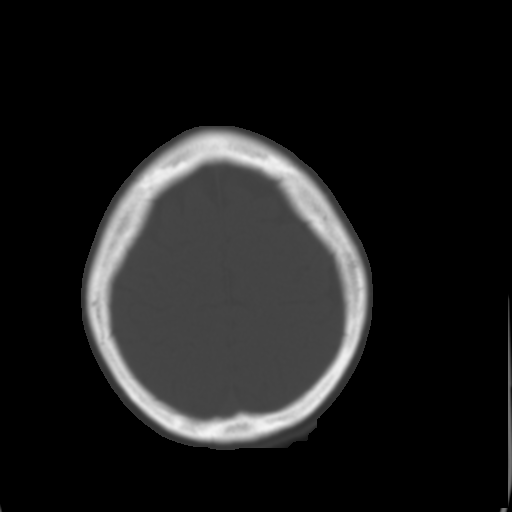
[im 25/30  brain]
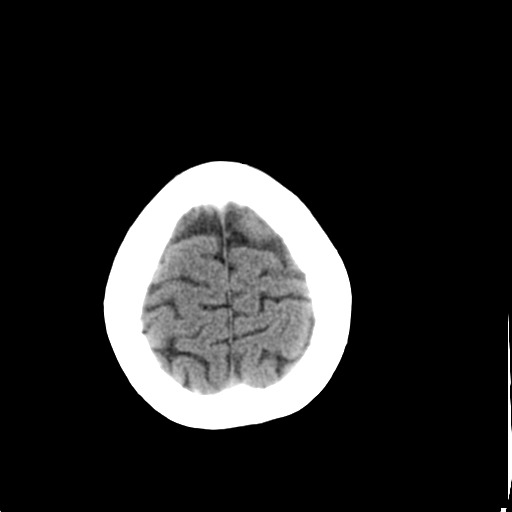
[im 27/30  brain]
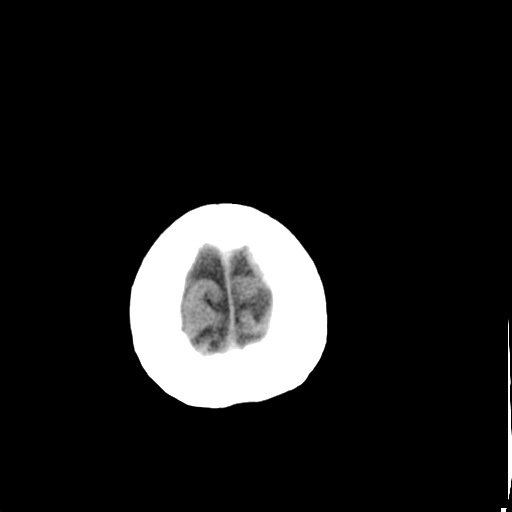
[im 29/30  brain]
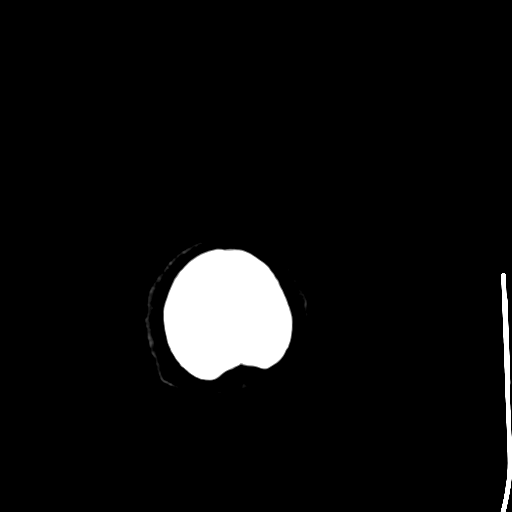

[16 of 30 positions shown; findings below may reference images not displayed]

FINDINGS: No skull fracture is noted.  Paranasal sinuses and
mastoid air cells are unremarkable.  No intracranial hemorrhage,
mass effect or midline shift.  No hydrocephalus.  No acute
infarction.  No mass lesion is noted on this unenhanced scan.  No
intra or extra-axial fluid collection.  The gray and white matter
differentiation is preserved.
IMPRESSION: No acute intracranial abnormality.

## 2013-08-19 IMAGING — CT CT HEAD W/O CM
1 of 2 series · 13 of 30 positions shown, 17 images · non-contrast
Comparison: 11/09/2010

CLINICAL DATA: 34-year-old female with headache, dizziness, nausea
and vomiting.

CT HEAD WITHOUT CONTRAST
TECHNIQUE: Contiguous axial images were obtained from the base of
the skull through the vertex without contrast.

[Series 2: brain · axial · 0.47mm/px · z∈[+117,+241]mm · 13 of 28 slices shown, 17 images]
[im 2/28  brain]
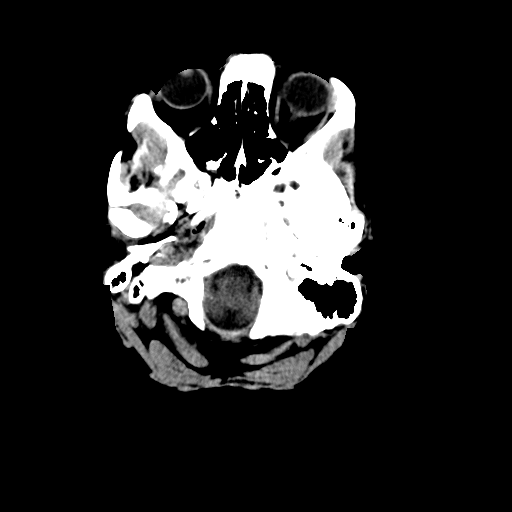
[im 2/28  bone]
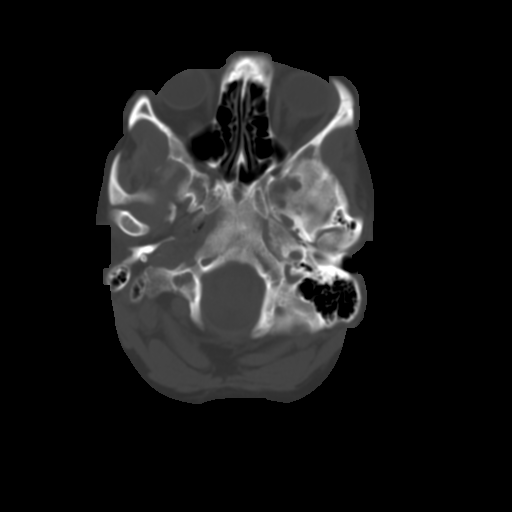
[im 4/28  brain]
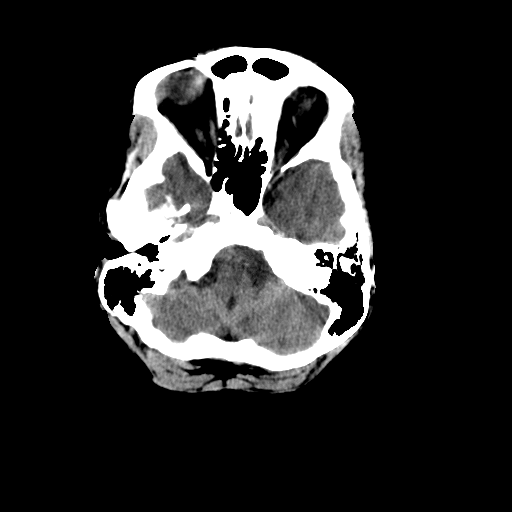
[im 6/28  brain]
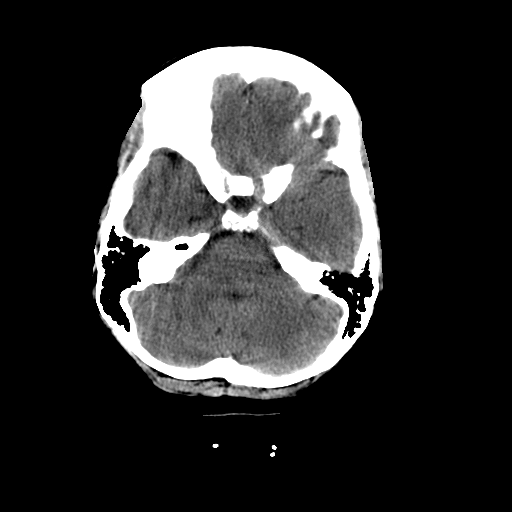
[im 8/28  brain]
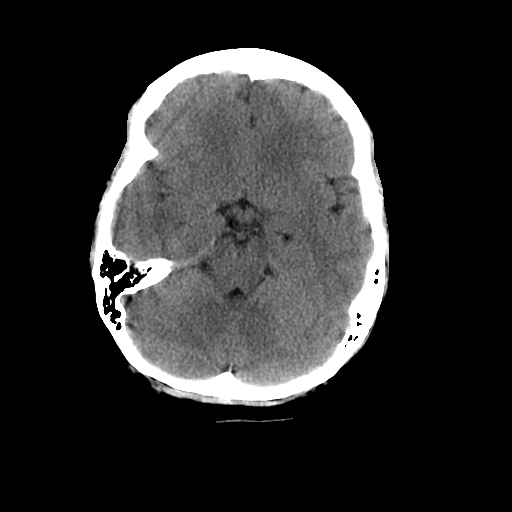
[im 10/28  brain]
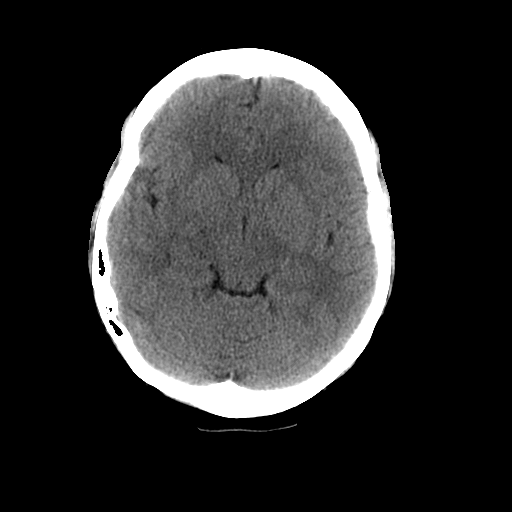
[im 10/28  bone]
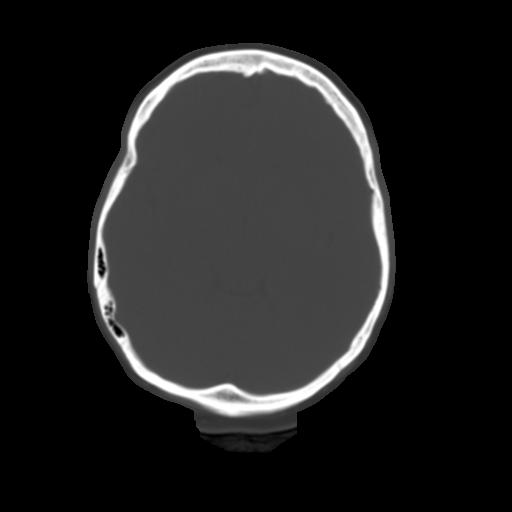
[im 12/28  brain]
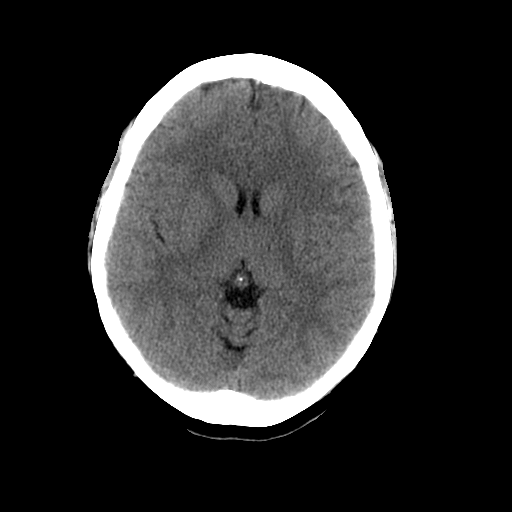
[im 14/28  brain]
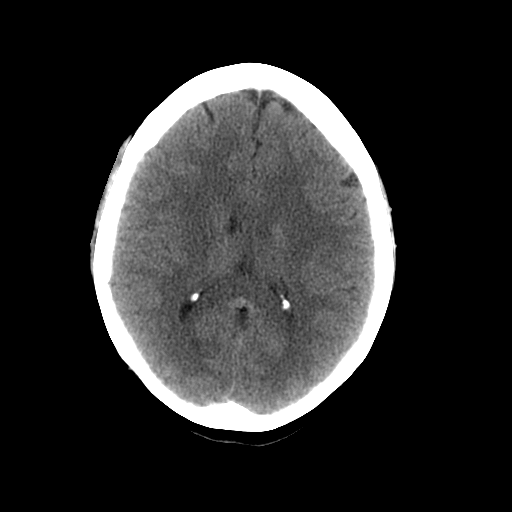
[im 16/28  brain]
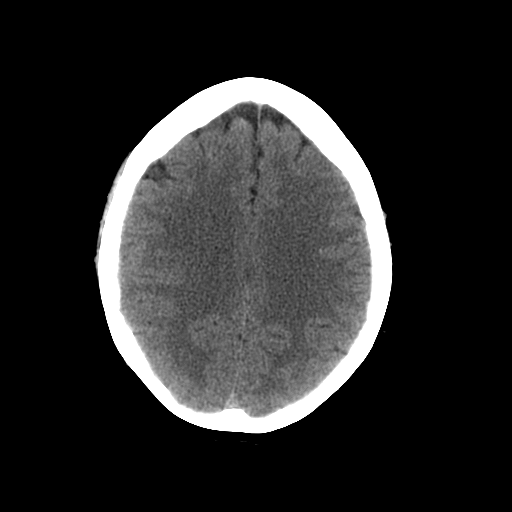
[im 18/28  brain]
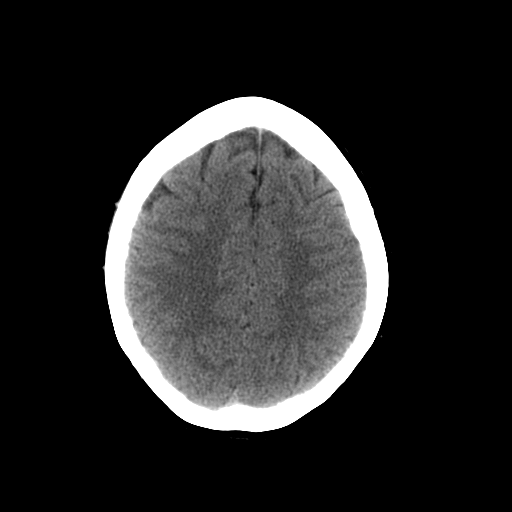
[im 18/28  bone]
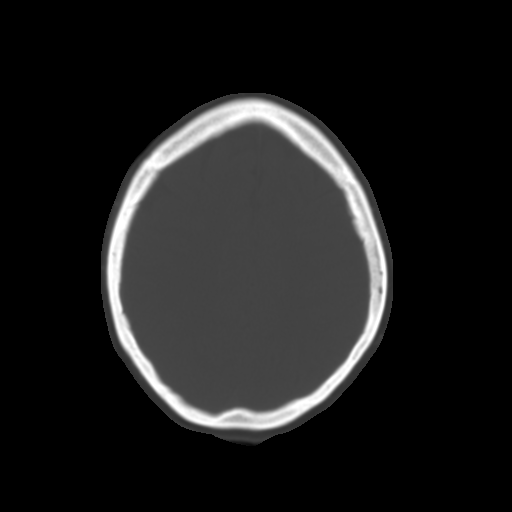
[im 20/28  brain]
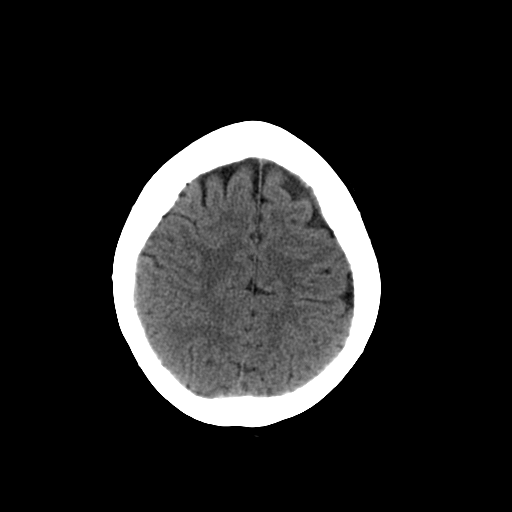
[im 22/28  brain]
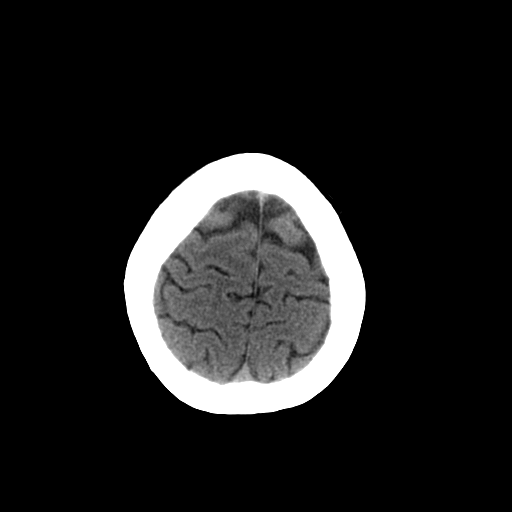
[im 24/28  brain]
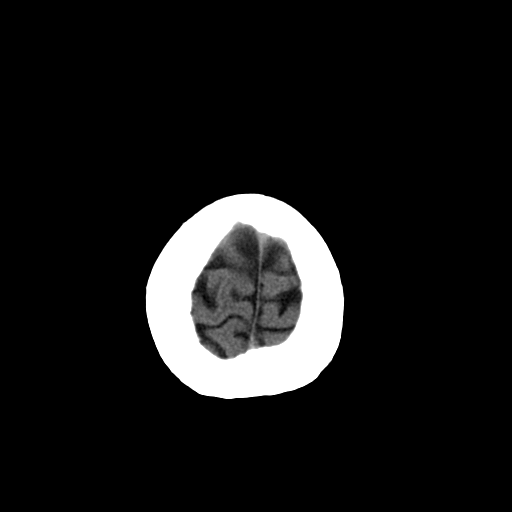
[im 26/28  brain]
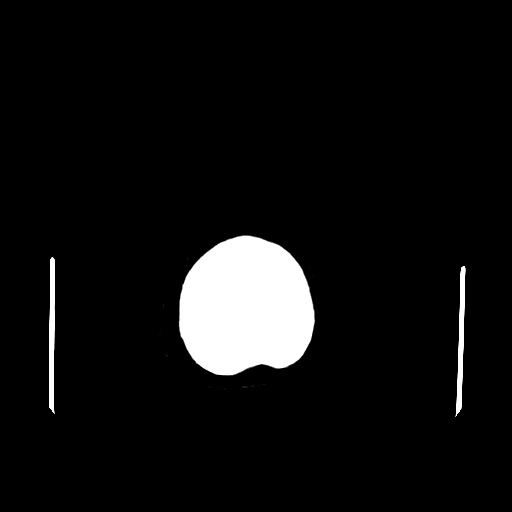
[im 26/28  bone]
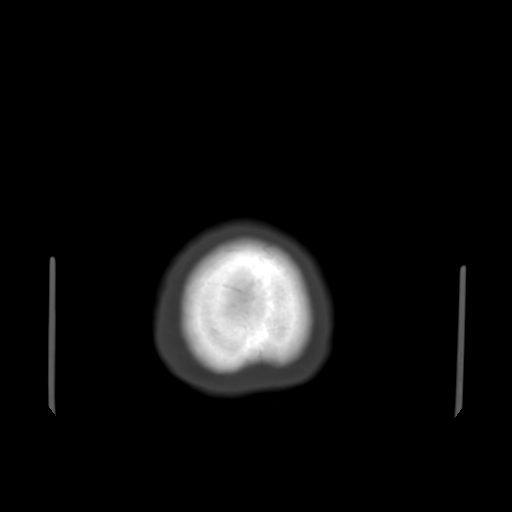

[13 of 30 positions shown; findings below may reference images not displayed]

FINDINGS: No acute intracranial abnormalities are identified,
including mass lesion or mass effect, hydrocephalus, extra-axial
fluid collection, midline shift, hemorrhage, or acute infarction.

The visualized bony calvarium is unremarkable.
IMPRESSION: No evidence of acute intracranial abnormality.

## 2013-08-24 ENCOUNTER — Encounter: Payer: Self-pay | Admitting: Family Medicine

## 2013-08-24 ENCOUNTER — Ambulatory Visit (INDEPENDENT_AMBULATORY_CARE_PROVIDER_SITE_OTHER): Payer: BC Managed Care – PPO | Admitting: Family Medicine

## 2013-08-24 VITALS — BP 136/86 | Temp 98.9°F | Ht 67.0 in | Wt 244.8 lb

## 2013-08-24 DIAGNOSIS — R300 Dysuria: Secondary | ICD-10-CM

## 2013-08-24 DIAGNOSIS — I889 Nonspecific lymphadenitis, unspecified: Secondary | ICD-10-CM

## 2013-08-24 LAB — POCT URINALYSIS DIPSTICK
SPEC GRAV UA: 1.015
pH, UA: 6

## 2013-08-24 MED ORDER — CEFPROZIL 500 MG PO TABS: 500.0000 mg | ORAL_TABLET | Freq: Two times a day (BID) | ORAL | Status: DC

## 2013-08-24 MED ORDER — HYDROCODONE-ACETAMINOPHEN 5-325 MG PO TABS: 1.0000 | ORAL_TABLET | Freq: Four times a day (QID) | ORAL | Status: DC | PRN

## 2013-08-24 NOTE — Progress Notes (Signed)
   Subjective:    Patient ID: [name], female    DOB: 1975-11-16, 38 y.o.   MRN: [id number]  HPI  Patient arrives with complaint of swollen lymph node on right side-patient states she woke up with it and it is sore-patient reports no other sx other than headache. She saw the dentist they told her that there was no sign of any tooth infection she denies head congestion drainage coughing. She states the pain cause soreness and discomfort it woke her up at night. She denies any skin involvement for any blisters. She has cat at home Review of Systems See above    Objective:   Physical Exam Lungs clear heart regular eardrums normal throat is normal anterior cervical lymphadenopathy on the right side at the angle of the jaw       Assessment & Plan:  Cervical lymphadenopathy-probable viral versus bacterial. I see no sign to make me worried about cancer Cefzil twice a day 10 days if ongoing troubles followup  Patient was cautioned that should she worsen she may need further testing such as CBC or potentially ultrasound  She did ask for her urine to be checked it was very concentrated some wbc's some epithelial cells antibiotic should cover for any infection she needs to drink more fluids and she was told

## 2013-08-25 ENCOUNTER — Telehealth: Payer: Self-pay | Admitting: Family Medicine

## 2013-08-25 ENCOUNTER — Telehealth: Payer: Self-pay | Admitting: *Deleted

## 2013-08-25 ENCOUNTER — Ambulatory Visit (INDEPENDENT_AMBULATORY_CARE_PROVIDER_SITE_OTHER): Payer: BC Managed Care – PPO | Admitting: Family Medicine

## 2013-08-25 ENCOUNTER — Encounter: Payer: Self-pay | Admitting: Family Medicine

## 2013-08-25 VITALS — BP 112/80 | Temp 99.0°F | Ht 67.0 in | Wt 244.0 lb

## 2013-08-25 DIAGNOSIS — M542 Cervicalgia: Secondary | ICD-10-CM

## 2013-08-25 DIAGNOSIS — R8290 Unspecified abnormal findings in urine: Secondary | ICD-10-CM

## 2013-08-25 DIAGNOSIS — M272 Inflammatory conditions of jaws: Secondary | ICD-10-CM

## 2013-08-25 DIAGNOSIS — R82998 Other abnormal findings in urine: Secondary | ICD-10-CM

## 2013-08-25 LAB — CBC WITH DIFFERENTIAL/PLATELET
Basophils Absolute: 0 10*3/uL (ref 0.0–0.1)
Basophils Relative: 0 % (ref 0–1)
EOS PCT: 1 % (ref 0–5)
Eosinophils Absolute: 0.1 10*3/uL (ref 0.0–0.7)
HEMATOCRIT: 41.9 % (ref 36.0–46.0)
Hemoglobin: 14.1 g/dL (ref 12.0–15.0)
LYMPHS ABS: 1.5 10*3/uL (ref 0.7–4.0)
LYMPHS PCT: 19 % (ref 12–46)
MCH: 28.8 pg (ref 26.0–34.0)
MCHC: 33.7 g/dL (ref 30.0–36.0)
MCV: 85.5 fL (ref 78.0–100.0)
Monocytes Absolute: 0.4 10*3/uL (ref 0.1–1.0)
Monocytes Relative: 5 % (ref 3–12)
Neutro Abs: 6 10*3/uL (ref 1.7–7.7)
Neutrophils Relative %: 75 % (ref 43–77)
PLATELETS: 218 10*3/uL (ref 150–400)
RBC: 4.9 MIL/uL (ref 3.87–5.11)
RDW: 13.2 % (ref 11.5–15.5)
WBC: 8 10*3/uL (ref 4.0–10.5)

## 2013-08-25 LAB — POCT URINALYSIS DIPSTICK
Ketones, UA: POSITIVE
Protein, UA: POSITIVE
Spec Grav, UA: 1.015
pH, UA: 6

## 2013-08-25 MED ORDER — AMOXICILLIN-POT CLAVULANATE 875-125 MG PO TABS: 1.0000 | ORAL_TABLET | Freq: Two times a day (BID) | ORAL | Status: DC

## 2013-08-25 NOTE — Telephone Encounter (Signed)
Called into [facility]  dukes magic mouthwash 12 ozs one tablespoon swish and spit QID for 5 days 0 refills. Per Dr. Brett CanalesSteve.

## 2013-08-25 NOTE — Telephone Encounter (Signed)
Also, she said that when she stands, her face feels flushed and tingly.

## 2013-08-25 NOTE — Telephone Encounter (Signed)
Nurses, please call the patient discussed with her what is going on. Is there any fever? Visit her to open and close mouth? Any sinus symptoms. Any pain with rotating the neck or moving it up and down? I would recommend a stat CBC. Patient may need to be seen this afternoon depending upon the answers to the questions

## 2013-08-25 NOTE — Progress Notes (Signed)
   Subjective:    Patient ID: [name], female    DOB: 06/04/1975, 38 y.o.   MRN: [id number]  HPI Patient was seen here yesterday for s/s.  She went to the dentist yesterday to have an xray done and he did not see an abcess tooth, but when she touches her gum on the right side, it hurts.  Due to see a dentist on Monday  She is having pain on the right side of her face, mouth, neck, and sinus area.   She said the pain got worst over night.   Pt said her urine is cloudy, and has an odor to it.   Results for orders placed in visit on 08/25/13  POCT URINALYSIS DIPSTICK      Result Value Ref Range   Color, UA       Clarity, UA       Glucose, UA       Bilirubin, UA       Ketones, UA POS     Spec Grav, UA 1.015     Blood, UA       pH, UA 6.0     Protein, UA POS     Urobilinogen, UA       Nitrite, UA       Leukocytes, UA small (1+)       Review of Systems No headache no chest pain no cough no fever no congestion ROS otherwise negative    Objective:   Physical Exam  Alert hydration good. Lungs clear. Heart regular rate and rhythm. HEENT upper posterior maxillary tooth tender to percussion inflamed dominance right submandibular gland tenderness and swelling.      Assessment & Plan:  Impression odontogenic infection with secondary lymphadenitis discussed white blood count normal. Urinalysis just shows slight ketones. Microscopic negative discussed plan switch to Augmentin. Rationale discussed. Pain medicine when necessary. Followup with Maurine Ministerennis. WSL

## 2013-08-25 NOTE — Telephone Encounter (Signed)
No fever but feels hot in the face. Painful to turn neck to right side. Having sinus pressure. No sleep last night because of pain. STAT CBC ordered pt to be worked in today at [employer] [employer]3:15 after doing bloodwork per Dr. Lorin PicketScott.

## 2013-08-25 NOTE — Telephone Encounter (Signed)
Patient said that the gland that she was saw for yesterday feels worse. She says she feels run down and her stomach is hurting. The gland is also bigger and hot to touch. She said she couldn't sleep last night. She is concerned that it has gotten worse since she started the antibiotic yesterday. Please advise.

## 2013-09-01 ENCOUNTER — Telehealth: Payer: Self-pay | Admitting: Family Medicine

## 2013-09-01 NOTE — Telephone Encounter (Signed)
Told patient she can cut her hydrocodone in half and take with a full meal. Also told her she can take 4 of the regular 200mg  tabs of IBU every 8 hours, but to not use it long term since it can cause stomach pain. Pt verbalized understanding.

## 2013-09-01 NOTE — Telephone Encounter (Signed)
Patient says that she was given Rx for hydrocodone and it made her really sick on her stomach. She said she got the tooth pulled that was causing her the pain, but didn't ask the dentist about anything for pain since she had the hydrocodone and they are closed today. She wants to know if we can call in Ibuprofen 800 mg.     [facility] [place]

## 2013-09-11 ENCOUNTER — Ambulatory Visit (INDEPENDENT_AMBULATORY_CARE_PROVIDER_SITE_OTHER): Payer: BC Managed Care – PPO | Admitting: Nurse Practitioner

## 2013-09-11 ENCOUNTER — Encounter: Payer: Self-pay | Admitting: Nurse Practitioner

## 2013-09-11 VITALS — BP 128/78 | Temp 98.4°F | Ht 67.0 in | Wt 245.0 lb

## 2013-09-11 DIAGNOSIS — N390 Urinary tract infection, site not specified: Secondary | ICD-10-CM

## 2013-09-11 LAB — POCT URINALYSIS DIPSTICK
Protein, UA: POSITIVE
SPEC GRAV UA: 1.02
pH, UA: 6

## 2013-09-11 MED ORDER — PHENAZOPYRIDINE HCL 200 MG PO TABS: 200.0000 mg | ORAL_TABLET | Freq: Three times a day (TID) | ORAL | Status: DC | PRN

## 2013-09-11 MED ORDER — CIPROFLOXACIN HCL 500 MG PO TABS: 500.0000 mg | ORAL_TABLET | Freq: Two times a day (BID) | ORAL | Status: DC

## 2013-09-11 NOTE — Patient Instructions (Signed)
Take pyridium for 48 hours then stop

## 2013-09-14 ENCOUNTER — Telehealth: Payer: Self-pay | Admitting: Family Medicine

## 2013-09-14 LAB — URINE CULTURE

## 2013-09-14 MED ORDER — FLUCONAZOLE 150 MG PO TABS: ORAL_TABLET | ORAL | Status: DC

## 2013-09-14 NOTE — Telephone Encounter (Signed)
Per Dr. Brett CanalesSteve- Diflucan 150mg  #2 one tablet 3 days apart.  Rx sent electronically to pharmacy. Patient notified.

## 2013-09-14 NOTE — Telephone Encounter (Signed)
Patient is having discharge and itchyness and thinks that she may be getting a yeast infection as well as a UTI that she was seen for by Eber Jonesarolyn the other day. Please advise.

## 2013-09-15 ENCOUNTER — Encounter: Payer: Self-pay | Admitting: Nurse Practitioner

## 2013-09-15 LAB — POCT UA - MICROSCOPIC ONLY
Bacteria, U Microscopic: POSITIVE
RBC, URINE, MICROSCOPIC: NEGATIVE

## 2013-09-15 NOTE — Progress Notes (Signed)
Subjective:  Presents for c/o urinary symptoms that began this morning. Urgency, frequency, pain and slight urinary odor. Voiding small amounts. Mid pelvic area pain. No back pain. On clindamycin for tooth infection per dentist. No fever. No vaginal discharge. Same sexual partner. No recent UTI. Taking fluids well. No nausea or vomiting.   Objective:   BP 128/78  Temp(Src) 98.4 F (36.9 C)  Ht 5\' 7"  (1.702 m)  Wt 245 lb (111.131 kg)  BMI 38.36 kg/m2 NAD. Alert, oriented. Lungs clear. Heart RRR. abd soft, nondistended with mild suprapubic area tenderness. No CVA or flank tenderness. Urine micro: WBC 5+ with bacteria.   Assessment: Urinary tract infection without hematuria, site unspecified - Plan: POCT urinalysis dipstick, POCT UA - Microscopic Only, Urine culture  Plan: continue Clindamycin as directed.  Meds ordered this encounter  Medications  . clindamycin (CLEOCIN) 300 MG capsule    Sig: Take 300 mg by mouth 3 (three) times daily.  Marland Kitchen. DISCONTD: phenazopyridine (PYRIDIUM) 200 MG tablet    Sig: Take 1 tablet (200 mg total) by mouth 3 (three) times daily as needed for pain.    Dispense:  10 tablet    Refill:  0    Order Specific Question:  Supervising Provider    Answer:  Merlyn AlbertLUKING, WILLIAM S [[id number]]  . DISCONTD: ciprofloxacin (CIPRO) 500 MG tablet    Sig: Take 1 tablet (500 mg total) by mouth 2 (two) times daily.    Dispense:  14 tablet    Refill:  0    Order Specific Question:  Supervising Provider    Answer:  Merlyn AlbertLUKING, WILLIAM S [[id number]]  . ciprofloxacin (CIPRO) 500 MG tablet    Sig: Take 1 tablet (500 mg total) by mouth 2 (two) times daily.    Dispense:  14 tablet    Refill:  0    Order Specific Question:  Supervising Provider    Answer:  Merlyn AlbertLUKING, WILLIAM S [[id number]]  . phenazopyridine (PYRIDIUM) 200 MG tablet    Sig: Take 1 tablet (200 mg total) by mouth 3 (three) times daily as needed for pain.    Dispense:  10 tablet    Refill:  0    Order Specific Question:  Supervising Provider     Answer:  Merlyn AlbertLUKING, WILLIAM S [[id number]]   Use pyridium for 48 hours then stop. Warning signs reviewed. Call back in 48 hours if no improvement, sooner if worse.

## 2013-11-06 ENCOUNTER — Telehealth: Payer: Self-pay | Admitting: Family Medicine

## 2013-11-06 NOTE — Telephone Encounter (Signed)
error 

## 2013-11-07 ENCOUNTER — Encounter: Payer: Self-pay | Admitting: Family Medicine

## 2013-11-07 ENCOUNTER — Ambulatory Visit (INDEPENDENT_AMBULATORY_CARE_PROVIDER_SITE_OTHER): Payer: BC Managed Care – PPO | Admitting: Family Medicine

## 2013-11-07 VITALS — BP 128/86 | Ht 67.0 in | Wt 251.0 lb

## 2013-11-07 DIAGNOSIS — N3000 Acute cystitis without hematuria: Secondary | ICD-10-CM

## 2013-11-07 MED ORDER — CIPROFLOXACIN HCL 500 MG PO TABS: 500.0000 mg | ORAL_TABLET | Freq: Two times a day (BID) | ORAL | Status: DC

## 2013-11-07 MED ORDER — PHENAZOPYRIDINE HCL 200 MG PO TABS: 200.0000 mg | ORAL_TABLET | Freq: Three times a day (TID) | ORAL | Status: DC | PRN

## 2013-11-07 NOTE — Progress Notes (Signed)
   Subjective:    Patient ID: [name], female    DOB: 1975/11/06, 38 y.o.   MRN: [id number]  HPI Patient is here for a UTI. Patient concerned about the UTI symptoms.  Burning and deiscomfort after urinating  freq and increased  Nocturia  No kidney pain or tend   Low abd  discomfortazoo low abd,    Review of Systems No vomiting no diarrhea no headache no fever no chills ROS otherwise negative per    Objective:   Physical Exam Alert no acute distress. Vitals stable. Afebrile. HEENT normal. Lungs clear. Heart regular in rhythm. Next no true CVA tenderness.  Urinalysis 4 white blood cells per high-power field. No red blood cells no epithelials       Assessment & Plan:  Impression urinary tract infection. Patient notes very last prescription of Cipro and at that strength was most helpful. Plan Will refill. Local measures discussed. Warning signs discussed. 15 minutes spent most in discussion. WSL

## 2013-11-16 ENCOUNTER — Telehealth: Payer: Self-pay | Admitting: Family Medicine

## 2013-11-16 MED ORDER — NITROFURANTOIN MONOHYD MACRO 100 MG PO CAPS: 100.0000 mg | ORAL_CAPSULE | Freq: Two times a day (BID) | ORAL | Status: AC

## 2013-11-16 NOTE — Telephone Encounter (Signed)
Rx sent electronically to pharmacy. Patient notified and verbalized understanding. 

## 2013-11-16 NOTE — Telephone Encounter (Signed)
Let pt know cipro is strong, but a different abcis warranted macrobid 100 bid 7 d

## 2013-11-16 NOTE — Telephone Encounter (Signed)
ciprofloxacin (CIPRO) 500 MG tablet   Pt issued on 9/1 for UTI,  States she is having burning, frequency,  No fever or low back pain, feels its reoccurring   Wants to know if she needs a stronger antibiotic?  [facility]

## 2013-12-22 ENCOUNTER — Other Ambulatory Visit: Payer: Self-pay

## 2014-01-08 ENCOUNTER — Encounter: Payer: Self-pay | Admitting: Family Medicine

## 2014-02-02 ENCOUNTER — Encounter: Payer: Self-pay | Admitting: Family Medicine

## 2014-02-02 ENCOUNTER — Ambulatory Visit (INDEPENDENT_AMBULATORY_CARE_PROVIDER_SITE_OTHER): Payer: BC Managed Care – PPO | Admitting: Family Medicine

## 2014-02-02 ENCOUNTER — Ambulatory Visit: Payer: BC Managed Care – PPO | Admitting: Family Medicine

## 2014-02-02 VITALS — BP 130/84 | Temp 98.8°F | Ht 67.0 in | Wt 258.0 lb

## 2014-02-02 DIAGNOSIS — I889 Nonspecific lymphadenitis, unspecified: Secondary | ICD-10-CM

## 2014-02-02 MED ORDER — DOXYCYCLINE HYCLATE 100 MG PO TABS: 100.0000 mg | ORAL_TABLET | Freq: Two times a day (BID) | ORAL | Status: DC

## 2014-02-02 NOTE — Progress Notes (Signed)
   Subjective:    Patient ID: [name], female    DOB: 02-Feb-1976, 38 y.o.   MRN: [id number]  Rash This is a new problem. The current episode started in the past 7 days. The problem is unchanged. The affected locations include the face. The rash is characterized by redness and blistering. She was exposed to nothing. Past treatments include antibiotic cream (Perioxide). The treatment provided no relief.  Patient also has a swollen area underneath her chin.   Patient states that she has no other concerns at this time.   Got a pimple like eruption  On ant lip, developefd pus and pain  Put bid bactroban on it  Then started to spred, keeping it clean   Developed knowt under the chin  Could feel it and tender  Slightly tend er  No fever no chills no sore throt Review of Systems  Skin: Positive for rash.      no vomiting no diarrhea no headache Objective:   Physical Exam  Alert active no apparent distress. Vitals stable. Lungs clear heart regular in rhythm. Anterior chin patchy impetigo-like rash. Submental lymph node palpable and tender.      Assessment & Plan:  Impression impetigo with secondary lymphadenitis discussed plan doxy twice a day local measures discussed Bactroban locally expect slow resolution. WSL

## 2014-02-05 ENCOUNTER — Ambulatory Visit: Payer: BC Managed Care – PPO | Admitting: Family Medicine

## 2014-02-05 ENCOUNTER — Telehealth: Payer: Self-pay | Admitting: Family Medicine

## 2014-02-05 MED ORDER — CEPHALEXIN 500 MG PO CAPS
500.0000 mg | ORAL_CAPSULE | Freq: Three times a day (TID) | ORAL | Status: DC
Start: 2014-02-05 — End: 2014-07-30

## 2014-02-05 NOTE — Telephone Encounter (Signed)
Keflex 500 tid ten d 

## 2014-02-05 NOTE — Telephone Encounter (Signed)
Diagnosed with impetigo secondary lymphadenitis.

## 2014-02-05 NOTE — Telephone Encounter (Signed)
Patient was notified that med was sent to pharmacy.

## 2014-02-05 NOTE — Telephone Encounter (Signed)
Patient says that the doxycycline is making her sick to her stomach and having dry heaves.  She said it also gave her a bad headache.  Can we switch the antibiotic? She was prescribed this on 02/02/2014. [facility] [place]

## 2014-03-19 ENCOUNTER — Telehealth: Payer: Self-pay | Admitting: Family Medicine

## 2014-03-19 NOTE — Telephone Encounter (Signed)
ERROR

## 2014-07-30 ENCOUNTER — Encounter: Payer: Self-pay | Admitting: Family Medicine

## 2014-07-30 ENCOUNTER — Ambulatory Visit (INDEPENDENT_AMBULATORY_CARE_PROVIDER_SITE_OTHER): Payer: BLUE CROSS/BLUE SHIELD | Admitting: Family Medicine

## 2014-07-30 VITALS — BP 124/76 | Temp 98.4°F | Ht 67.0 in | Wt 275.0 lb

## 2014-07-30 DIAGNOSIS — J329 Chronic sinusitis, unspecified: Secondary | ICD-10-CM

## 2014-07-30 DIAGNOSIS — H8110 Benign paroxysmal vertigo, unspecified ear: Secondary | ICD-10-CM

## 2014-07-30 MED ORDER — AMOXICILLIN 500 MG PO CAPS: 500.0000 mg | ORAL_CAPSULE | Freq: Three times a day (TID) | ORAL | Status: DC

## 2014-07-30 MED ORDER — MECLIZINE HCL 25 MG PO TABS: 25.0000 mg | ORAL_TABLET | Freq: Three times a day (TID) | ORAL | Status: DC | PRN

## 2014-07-30 MED ORDER — ONDANSETRON 4 MG PO TBDP: 4.0000 mg | ORAL_TABLET | Freq: Three times a day (TID) | ORAL | Status: DC | PRN

## 2014-07-30 NOTE — Progress Notes (Signed)
   Subjective:    Patient ID: [name], female    DOB: Sep 19, 1975, 39 y.o.   MRN: [id number]  Sinusitis This is a new problem. The current episode started yesterday. Associated symptoms include ear pain, headaches and sinus pressure. (Dizzy ) Past treatments include nothing.   Dizzy kicked in yest eve  Slight nausea  Out doors yest, was out in the sun a lot  Not sick yest  Felt unsteady lat night  con'td to worsen thru out the night with change in position  Unsteady true vertgo symptoms  Lasts for awhile   Frontal and nuchal headache feels tender in the sinuses  Some fussiness    sens in the frontal and maxlilary region    Review of Systems  HENT: Positive for ear pain and sinus pressure.   Neurological: Positive for headaches.       Objective:   Physical Exam   Alert moderate malaise. Hydration good. Vitals stable. HEENT moderate nasal congestion frontal tenderness pharynx normal neck supple. Lungs clear. Heart regular in rhythm. Neuro exam intact.     Assessment & Plan:  Impression flare of vertigo with element of rhinosinusitis plan antibiotics prescribed. Symptomatic care discussed. Antivert when necessary. Warning signs discussed WSL

## 2014-08-01 ENCOUNTER — Telehealth: Payer: Self-pay | Admitting: Family Medicine

## 2014-08-01 NOTE — Telephone Encounter (Signed)
Pt wants a nurse to call her regarding the dizzyness and headaches that She is still having.

## 2014-08-01 NOTE — Telephone Encounter (Signed)
Patient states that her dizziness and headaches have not improved since being seen on 07/29/13. She wants to know can a CT scan and some blood work be ordered to see what is going on?

## 2014-08-02 ENCOUNTER — Ambulatory Visit: Payer: BLUE CROSS/BLUE SHIELD | Admitting: Family Medicine

## 2014-08-02 NOTE — Telephone Encounter (Signed)
LMRC

## 2014-08-02 NOTE — Telephone Encounter (Signed)
Transferred to front desk to schedule appointment.  

## 2014-08-02 NOTE — Telephone Encounter (Signed)
Call pt, o v today

## 2014-08-03 ENCOUNTER — Ambulatory Visit (INDEPENDENT_AMBULATORY_CARE_PROVIDER_SITE_OTHER): Payer: BLUE CROSS/BLUE SHIELD | Admitting: Family Medicine

## 2014-08-03 ENCOUNTER — Encounter: Payer: Self-pay | Admitting: Family Medicine

## 2014-08-03 VITALS — BP 132/78 | Temp 98.4°F | Ht 67.0 in | Wt 278.0 lb

## 2014-08-03 DIAGNOSIS — R519 Headache, unspecified: Secondary | ICD-10-CM

## 2014-08-03 DIAGNOSIS — R51 Headache: Secondary | ICD-10-CM | POA: Diagnosis not present

## 2014-08-03 DIAGNOSIS — Z139 Encounter for screening, unspecified: Secondary | ICD-10-CM | POA: Diagnosis not present

## 2014-08-03 LAB — POCT GLUCOSE (DEVICE FOR HOME USE): POC Glucose: 100 mg/dl — AB (ref 70–99)

## 2014-08-03 MED ORDER — CEFDINIR 300 MG PO CAPS: 300.0000 mg | ORAL_CAPSULE | Freq: Two times a day (BID) | ORAL | Status: DC

## 2014-08-03 MED ORDER — ETODOLAC 400 MG PO TABS: 400.0000 mg | ORAL_TABLET | Freq: Two times a day (BID) | ORAL | Status: DC

## 2014-08-03 NOTE — Progress Notes (Signed)
   Subjective:    Patient ID: [name], female    DOB: 03/05/1976, 39 y.o.   MRN: [id number]  HPIFollow up on headaches and dizziness. Seen on 5/27.   Having a dull headache in the back of head.  Taking antivert for dizziness. It does help some. Takes 1 - 2 a day. No photophobia no phono phobia    This particular spell of vertigo has been more of a problem than usual  Generally has normally a heafache once a week or so for tension headaches, grenerally bi temporal  Pt worried about more possible serious etiologies to the headache,,  Notes headaches are getting worse in recent weeks. Day and night sometimes. Wakes up with them in morning.   h  A is more posterior and a deep ache, no light sens, no throbbinng, steady h a lingering dullness  Still having sinus pressure and ear pain. Taking amoxil.   Pt requesting blood sugar checked. BS 100. Pt ate  [employer] about 7 hours ago.    Review of Systems No loss of consciousness no seizures no back pain chest pain ROS otherwise negative    Objective:   Physical Exam  Alert vitals stable HEENT normal. Lungs clear. Heart regular in rhythm. Neuro exam grossly intact strength sensation intact. Cerebellar function normal. No focal neurological deficits.      Assessment & Plan:  Impression worsening crescendo headaches now day and night with substantial worry on part of patient plan MRI of brain. Symptomatic care discussed. Finish antibiotic-coated for sinusitis component. Antivert when necessary. Exercise encourage. WSL

## 2014-08-07 ENCOUNTER — Ambulatory Visit (HOSPITAL_COMMUNITY): Payer: Self-pay

## 2014-08-07 ENCOUNTER — Ambulatory Visit (HOSPITAL_COMMUNITY): Admission: RE | Admit: 2014-08-07 | Payer: BLUE CROSS/BLUE SHIELD | Source: Ambulatory Visit

## 2014-08-07 ENCOUNTER — Other Ambulatory Visit (HOSPITAL_COMMUNITY): Payer: Self-pay

## 2014-08-07 ENCOUNTER — Other Ambulatory Visit: Payer: Self-pay | Admitting: Family Medicine

## 2014-08-07 ENCOUNTER — Ambulatory Visit (HOSPITAL_COMMUNITY)
Admission: RE | Admit: 2014-08-07 | Discharge: 2014-08-07 | Disposition: A | Discharge status: Home / Self Care | Payer: BLUE CROSS/BLUE SHIELD | Source: Ambulatory Visit | Attending: Family Medicine | Admitting: Family Medicine

## 2014-08-07 DIAGNOSIS — R51 Headache: Principal | ICD-10-CM

## 2014-08-07 DIAGNOSIS — R519 Headache, unspecified: Secondary | ICD-10-CM

## 2014-08-16 ENCOUNTER — Encounter: Payer: BLUE CROSS/BLUE SHIELD | Admitting: Nurse Practitioner

## 2014-08-16 ENCOUNTER — Other Ambulatory Visit (HOSPITAL_COMMUNITY): Payer: Self-pay

## 2014-08-29 ENCOUNTER — Ambulatory Visit: Payer: BLUE CROSS/BLUE SHIELD | Admitting: Family Medicine

## 2014-09-07 ENCOUNTER — Encounter: Payer: Self-pay | Admitting: Family Medicine

## 2014-09-07 ENCOUNTER — Ambulatory Visit (INDEPENDENT_AMBULATORY_CARE_PROVIDER_SITE_OTHER): Payer: BLUE CROSS/BLUE SHIELD | Admitting: Family Medicine

## 2014-09-07 VITALS — BP 122/82 | Temp 99.2°F | Ht 67.0 in | Wt 279.4 lb

## 2014-09-07 DIAGNOSIS — J329 Chronic sinusitis, unspecified: Secondary | ICD-10-CM | POA: Diagnosis not present

## 2014-09-07 MED ORDER — CLARITHROMYCIN 500 MG PO TABS: 500.0000 mg | ORAL_TABLET | Freq: Two times a day (BID) | ORAL | Status: DC

## 2014-09-07 MED ORDER — BENZONATATE 100 MG PO CAPS
100.0000 mg | ORAL_CAPSULE | Freq: Three times a day (TID) | ORAL | Status: DC | PRN
Start: 2014-09-07 — End: 2014-09-25

## 2014-09-07 NOTE — Progress Notes (Signed)
   Subjective:    Patient ID: [name], female    DOB: [birth year], 39 y.o.   MRN: [id number]  Cough This is a new problem. The current episode started 1 to 4 weeks ago. Associated symptoms include nasal congestion and a sore throat. Associated symptoms comments: Chest hurting.   Started in the sinus area a couple wks ago,  Took a round of cefzil, got a little better  Seemed to help but now things are worse  Pt notes heaviness in the chest, trouble breathing   Using mucinex plus a decongestant  Now throat is very sore and into the chest  No one else sick at the family  Non productive coughhad prod early on with the cough    Review of Systems  HENT: Positive for sore throat.   Respiratory: Positive for cough.        Objective:   Physical Exam Alert moderate malaise. H&T moderate his congestion. Frontal tenderness. Pharynx normal neck supple. Lungs bronchial cough heart rate and rhythm.       Assessment & Plan:  Impression rhinosinusitis/bronchitis plan anti-bites prescribed. Symptom care discussed. Warning signs discussed. WSL

## 2014-09-14 ENCOUNTER — Telehealth: Payer: Self-pay | Admitting: Family Medicine

## 2014-09-14 ENCOUNTER — Encounter: Payer: Self-pay | Admitting: Nurse Practitioner

## 2014-09-14 ENCOUNTER — Ambulatory Visit (INDEPENDENT_AMBULATORY_CARE_PROVIDER_SITE_OTHER): Payer: BLUE CROSS/BLUE SHIELD | Admitting: Nurse Practitioner

## 2014-09-14 VITALS — BP 128/80 | Temp 98.7°F

## 2014-09-14 DIAGNOSIS — B370 Candidal stomatitis: Secondary | ICD-10-CM | POA: Diagnosis not present

## 2014-09-14 DIAGNOSIS — J0101 Acute recurrent maxillary sinusitis: Secondary | ICD-10-CM | POA: Diagnosis not present

## 2014-09-14 MED ORDER — METHYLPREDNISOLONE ACETATE 40 MG/ML IJ SUSP
40.0000 mg | Freq: Once | INTRAMUSCULAR | Status: AC
  Administered 2014-09-14: 40 mg via INTRAMUSCULAR

## 2014-09-14 MED ORDER — FLUCONAZOLE 100 MG PO TABS: ORAL_TABLET | ORAL | Status: DC

## 2014-09-14 MED ORDER — PREDNISONE 20 MG PO TABS: ORAL_TABLET | ORAL | Status: DC

## 2014-09-14 MED ORDER — CLINDAMYCIN HCL 300 MG PO CAPS: 300.0000 mg | ORAL_CAPSULE | Freq: Three times a day (TID) | ORAL | Status: DC

## 2014-09-14 NOTE — Telephone Encounter (Signed)
Patient given Biaxin 09/07/14

## 2014-09-14 NOTE — Telephone Encounter (Signed)
Patient to come by to have throat checked 09/14/14

## 2014-09-14 NOTE — Telephone Encounter (Signed)
New symptoms on for 2 days

## 2014-09-14 NOTE — Telephone Encounter (Signed)
Biaxin covers strep throat so wondering if this is viral sore throat or oral candidiasis from antibiotic. Can she stop by so I can look at throat?

## 2014-09-14 NOTE — Telephone Encounter (Signed)
Pt seen last Friday for chest congestion, issued antibiotic, which she has taken An those symptoms feel better, she now has swollen glands, sore throat, white  Patches on throat.  Can she get another round of antibiotic called in?  [facility]

## 2014-09-18 ENCOUNTER — Encounter: Payer: Self-pay | Admitting: Nurse Practitioner

## 2014-09-18 NOTE — Progress Notes (Signed)
Subjective:  Presents for recheck on her sinusitis. Was seen on 7/14 rhinosinusitis. Was getting better on Biaxin. Chest congestion resolved. Continues to have intense maxillary area pressure with green drainage. No fever. No ear pain. Sore throat over the past several days mainly deeper in the back of the throat. No oral lesions. Admits that her tongue is slightly sore. Taking fluids well. Voiding normal limit. Denies any symptoms of vaginal candidiasis.  Objective:   BP 128/80 mmHg  Temp(Src) 98.7 F (37.1 C) (Oral) NAD. Alert, oriented. TMs are very retracted, no erythema. Pharynx nonerythematous with green PND noted. Tongue has a slight white film. No other oral lesions noted. Neck supple with mild soft anterior adenopathy. Obvious head congestion noted. Lungs clear. Heart regular rate rhythm.  Assessment: Acute recurrent maxillary sinusitis - Plan: methylPREDNISolone acetate (DEPO-MEDROL) injection 40 mg  Oral candidiasis  Plan:  Meds ordered this encounter  Medications  . predniSONE (DELTASONE) 20 MG tablet    Sig: 3 po qd x 3 d then 2 po qd x 3 d then 1 po qd x 3 d    Dispense:  18 tablet    Refill:  0    Start 7/9 am    Order Specific Question:  Supervising Provider    Answer:  Merlyn AlbertLUKING, WILLIAM S [[id number]]  . fluconazole (DIFLUCAN) 100 MG tablet    Sig: 2 po first day then one po qd x 14 d    Dispense:  16 tablet    Refill:  0    Order Specific Question:  Supervising Provider    Answer:  Merlyn AlbertLUKING, WILLIAM S [[id number]]  . clindamycin (CLEOCIN) 300 MG capsule    Sig: Take 1 capsule (300 mg total) by mouth 3 (three) times daily.    Dispense:  30 capsule    Refill:  0    Order Specific Question:  Supervising Provider    Answer:  Merlyn AlbertLUKING, WILLIAM S [[id number]]  . methylPREDNISolone acetate (DEPO-MEDROL) injection 40 mg    Sig:    Patient understands that with antibiotic and steroid use this may continue her oral candidiasis symptoms. OTC meds as directed for congestion and cough. Call back  next week if no improvement, sooner if worse.

## 2014-09-25 ENCOUNTER — Ambulatory Visit (INDEPENDENT_AMBULATORY_CARE_PROVIDER_SITE_OTHER): Payer: BLUE CROSS/BLUE SHIELD | Admitting: Nurse Practitioner

## 2014-09-25 ENCOUNTER — Ambulatory Visit (HOSPITAL_COMMUNITY)
Admission: RE | Admit: 2014-09-25 | Discharge: 2014-09-25 | Disposition: A | Discharge status: Home / Self Care | Payer: BLUE CROSS/BLUE SHIELD | Source: Ambulatory Visit | Attending: Nurse Practitioner | Admitting: Nurse Practitioner

## 2014-09-25 ENCOUNTER — Encounter: Payer: Self-pay | Admitting: Nurse Practitioner

## 2014-09-25 VITALS — BP 126/82 | Temp 98.4°F | Ht 67.0 in | Wt 280.0 lb

## 2014-09-25 DIAGNOSIS — J300 Vasomotor rhinitis: Secondary | ICD-10-CM | POA: Diagnosis not present

## 2014-09-25 DIAGNOSIS — R05 Cough: Secondary | ICD-10-CM | POA: Insufficient documentation

## 2014-09-25 DIAGNOSIS — R0989 Other specified symptoms and signs involving the circulatory and respiratory systems: Secondary | ICD-10-CM | POA: Diagnosis not present

## 2014-09-25 DIAGNOSIS — K2100 Gastro-esophageal reflux disease with esophagitis, without bleeding: Secondary | ICD-10-CM

## 2014-09-25 DIAGNOSIS — R059 Cough, unspecified: Secondary | ICD-10-CM

## 2014-09-25 DIAGNOSIS — B370 Candidal stomatitis: Secondary | ICD-10-CM

## 2014-09-25 LAB — CBC WITH DIFFERENTIAL/PLATELET
BASOS ABS: 0 10*3/uL (ref 0.0–0.2)
BASOS: 0 %
EOS (ABSOLUTE): 0.2 10*3/uL (ref 0.0–0.4)
EOS: 2 %
Hematocrit: 41 % (ref 34.0–46.6)
Hemoglobin: 14.1 g/dL (ref 11.1–15.9)
Lymphocytes Absolute: 2.6 10*3/uL (ref 0.7–3.1)
Lymphs: 34 %
MCH: 28.3 pg (ref 26.6–33.0)
MCHC: 34.4 g/dL (ref 31.5–35.7)
MCV: 82 fL (ref 79–97)
MONOCYTES: 9 %
Monocytes Absolute: 0.7 10*3/uL (ref 0.1–0.9)
Neutrophils Absolute: 4.2 10*3/uL (ref 1.4–7.0)
Neutrophils: 55 %
Platelets: 261 10*3/uL (ref 150–379)
RBC: 4.98 x10E6/uL (ref 3.77–5.28)
RDW: 14.7 % (ref 12.3–15.4)
WBC: 7.6 10*3/uL (ref 3.4–10.8)

## 2014-09-25 MED ORDER — HYDROCODONE-HOMATROPINE 5-1.5 MG/5ML PO SYRP: 5.0000 mL | ORAL_SOLUTION | ORAL | Status: DC | PRN

## 2014-09-25 MED ORDER — FLUCONAZOLE 100 MG PO TABS: ORAL_TABLET | ORAL | Status: DC

## 2014-09-25 MED ORDER — ALBUTEROL SULFATE HFA 108 (90 BASE) MCG/ACT IN AERS: 2.0000 | INHALATION_SPRAY | RESPIRATORY_TRACT | Status: DC | PRN

## 2014-09-25 MED ORDER — FLUTICASONE PROPIONATE 50 MCG/ACT NA SUSP: 2.0000 | Freq: Every day | NASAL | Status: DC

## 2014-09-25 MED ORDER — PANTOPRAZOLE SODIUM 40 MG PO TBEC: 40.0000 mg | DELAYED_RELEASE_TABLET | Freq: Every day | ORAL | Status: DC

## 2014-09-25 NOTE — Patient Instructions (Signed)
Generic allegra 180 mg once a day

## 2014-09-26 ENCOUNTER — Encounter: Payer: Self-pay | Admitting: Nurse Practitioner

## 2014-09-26 NOTE — Progress Notes (Signed)
Subjective:  Presents with complaints of cough over the past several days. Last seen 3 weeks ago for maxillary sinusitis, the symptoms have improved. No fever sore throat headache. Slight ear pain. Frequent cough occasionally producing green to brown sputum. Nonsmoker. Now has whiteness on her tongue again. No nausea vomiting diarrhea or abdominal pain. Having a flareup of reflux four out of 7 days even on daily Zantac 150 mg. Also takes Tums at times. Moderate caffeine intake. Denies any alcohol or excessive NSAID use. No problem with her bowels taking Diflucan probiotics and eating yogurt. History of allergies, no known exposure. Currently on Flonase Claritin and Mucinex. Using air purifier at home. No wheezing.  Objective:   BP 126/82 mmHg  Temp(Src) 98.4 F (36.9 C) (Oral)  Ht 5\' 7"  (1.702 m)  Wt 280 lb (127.007 kg)  BMI 43.84 kg/m2 NAD. Alert, oriented. TMs mildly retracted, no erythema. Pharynx mildly injected with cloudy PND noted. White film noted on tongue. Neck supple with mild soft anterior adenopathy. Lungs clear. Heart regular rate rhythm. Occasional congested cough. Abdomen soft nondistended nontender.  Assessment: Vasomotor rhinitis  Gastroesophageal reflux disease with esophagitis - Plan: CBC with Differential/Platelet, DG Chest 2 View  Cough - Plan: CBC with Differential/Platelet, DG Chest 2 View  Oral candidiasis - Plan: DG Chest 2 View  Plan:  Meds ordered this encounter  Medications  . fluticasone (FLONASE) 50 MCG/ACT nasal spray    Sig: Place 2 sprays into both nostrils daily.    Dispense:  16 g    Refill:  5    Order Specific Question:  Supervising Provider    Answer:  Merlyn AlbertLUKING, WILLIAM S [[id number]]  . pantoprazole (PROTONIX) 40 MG tablet    Sig: Take 1 tablet (40 mg total) by mouth daily. Prn acid reflux    Dispense:  30 tablet    Refill:  2    Order Specific Question:  Supervising Provider    Answer:  Merlyn AlbertLUKING, WILLIAM S [[id number]]  . HYDROcodone-homatropine (HYCODAN)  5-1.5 MG/5ML syrup    Sig: Take 5 mLs by mouth every 4 (four) hours as needed.    Dispense:  120 mL    Refill:  0    Order Specific Question:  Supervising Provider    Answer:  Merlyn AlbertLUKING, WILLIAM S [[id number]]  . fluconazole (DIFLUCAN) 100 MG tablet    Sig: 2 po first day then one po qd x 14 d    Dispense:  16 tablet    Refill:  0    Order Specific Question:  Supervising Provider    Answer:  Merlyn AlbertLUKING, WILLIAM S [[id number]]  . albuterol (PROVENTIL HFA;VENTOLIN HFA) 108 (90 BASE) MCG/ACT inhaler    Sig: Inhale 2 puffs into the lungs every 4 (four) hours as needed.    Dispense:  1 Inhaler    Refill:  0    Order Specific Question:  Supervising Provider    Answer:  Riccardo DubinLUKING, WILLIAM S [[id number]]   X-ray and lab work pending. Trial of albuterol inhaler particularly at nighttime when her cough is at its worse. Decrease caffeine intake. Switched to Protonix for reflux. Call back in 2 weeks if no improvement, sooner if worse.

## 2014-09-28 ENCOUNTER — Other Ambulatory Visit: Payer: Self-pay | Admitting: Nurse Practitioner

## 2014-09-28 ENCOUNTER — Telehealth: Payer: Self-pay | Admitting: Family Medicine

## 2014-09-28 MED ORDER — MONTELUKAST SODIUM 10 MG PO TABS: 10.0000 mg | ORAL_TABLET | Freq: Every day | ORAL | Status: DC

## 2014-09-28 NOTE — Telephone Encounter (Signed)
Message for [name]-Patient was seen 7/19 and her allergy are not any better. She wants a prescription called in for singular to [facility]

## 2014-11-26 ENCOUNTER — Ambulatory Visit: Payer: BLUE CROSS/BLUE SHIELD | Admitting: Nurse Practitioner

## 2014-12-14 ENCOUNTER — Telehealth: Payer: Self-pay | Admitting: Family Medicine

## 2014-12-14 ENCOUNTER — Ambulatory Visit: Payer: BLUE CROSS/BLUE SHIELD | Admitting: Family Medicine

## 2014-12-14 MED ORDER — AMOXICILLIN-POT CLAVULANATE 875-125 MG PO TABS: ORAL_TABLET | ORAL | Status: DC

## 2014-12-14 MED ORDER — MECLIZINE HCL 25 MG PO TABS: ORAL_TABLET | ORAL | Status: DC

## 2014-12-14 NOTE — Telephone Encounter (Signed)
Aug 875 bid ten d , antivdrt 25 numb thirty one q 6 hrs prn dizziness

## 2014-12-14 NOTE — Addendum Note (Signed)
Addended by: Theodora Blow R on: 12/14/2014 01:43 PM   Modules accepted: Orders

## 2014-12-14 NOTE — Telephone Encounter (Signed)
Patient called stating that she will not be able to make it into her appointment this afternoon due to her husband being stuck into traffic and not being able to bring her. She is wondering if something can be called in for her vertigo in which she stated she believes is associated with her sinus due to ear pain. Please advise

## 2014-12-14 NOTE — Telephone Encounter (Signed)
Called patient and informed her per Dr.Steve Luking- Antivert and Augmentin were sent into pharmacy. Patient verbalized understanding.

## 2014-12-21 ENCOUNTER — Telehealth: Payer: Self-pay | Admitting: Family Medicine

## 2014-12-21 MED ORDER — DOXYCYCLINE HYCLATE 100 MG PO TABS: 100.0000 mg | ORAL_TABLET | Freq: Two times a day (BID) | ORAL | Status: DC

## 2014-12-21 NOTE — Telephone Encounter (Signed)
Notified patient first of all Zithromax generally does not do much for the sinuses. As for congestion allergy medicine and allergy nasal spray may help. Recommend a different antibiotic. Recommend doxycycline 100 mg 1 twice a day with the snack and a tall glass of water for 10 days. Also patient must do office visit if further trouble. Patient verbalized understanding. Med sent to pharmacy.

## 2014-12-21 NOTE — Telephone Encounter (Signed)
Pt was put on amoxicillin for sinus pressure an vertigo issues  Finished her med but still feels congested, no vertigo at this time  Wants to know if she can get like a zpak to break it up the rest of the  Way, an get rid of the pressure.   [facility]

## 2014-12-21 NOTE — Telephone Encounter (Signed)
First of all Zithromax generally does not do much for the sinuses. As for congestion allergy medicine and allergy nasal spray may help. I would recommend a different antibiotic. I would recommend doxycycline 100 mg 1 twice a day with the snack and a tall glass of water for 10 days. Also patient must do office visit if further trouble. Cannot do further management of this issue be up phone calls

## 2014-12-25 ENCOUNTER — Other Ambulatory Visit: Payer: Self-pay | Admitting: Nurse Practitioner

## 2015-01-15 ENCOUNTER — Ambulatory Visit (INDEPENDENT_AMBULATORY_CARE_PROVIDER_SITE_OTHER): Payer: BLUE CROSS/BLUE SHIELD | Admitting: Family Medicine

## 2015-01-15 ENCOUNTER — Encounter: Payer: Self-pay | Admitting: Family Medicine

## 2015-01-15 VITALS — Temp 98.5°F | Ht 67.0 in | Wt 281.8 lb

## 2015-01-15 DIAGNOSIS — N390 Urinary tract infection, site not specified: Secondary | ICD-10-CM

## 2015-01-15 MED ORDER — CIPROFLOXACIN HCL 500 MG PO TABS: 500.0000 mg | ORAL_TABLET | Freq: Two times a day (BID) | ORAL | Status: AC

## 2015-01-15 MED ORDER — PHENAZOPYRIDINE HCL 200 MG PO TABS: 200.0000 mg | ORAL_TABLET | Freq: Three times a day (TID) | ORAL | Status: DC | PRN

## 2015-01-15 NOTE — Progress Notes (Signed)
   Subjective:    Patient ID: [name], female    DOB: October 22, 1975, 39 y.o.   MRN: [id number]  Dysuria  This is a new problem. The current episode started yesterday. Treatments tried: AZO.   Patient also notes slight nausea at times. Diminished energy diminished appetite  Low abd burning and painful  Pos dysuria  Nocturia  nofever or chills  Pt took   Pos  U A   Feels quazzy    Review of Systems  Genitourinary: Positive for dysuria.   no headache no high fevers no chills     Objective:   Physical Exam  Alert vitals stable HET normal lungs clear heart regular in rhythm question borderline left CVA tenderness abdomen soft positive suprapubic tenderness 6  Urinalysis 8-10 white blood cells per high-power field not enough urine for culture      Assessment & Plan:  Impression urinary tract infection multiple questions answered will increased dose due to nausea and left CVA tenderness plan Cipro 500 twice a day 7 days symptoms care discussed hydration discussed WSL

## 2015-01-18 ENCOUNTER — Telehealth: Payer: Self-pay | Admitting: Family Medicine

## 2015-01-18 MED ORDER — TRAMADOL HCL 50 MG PO TABS: 50.0000 mg | ORAL_TABLET | Freq: Four times a day (QID) | ORAL | Status: DC | PRN

## 2015-01-18 NOTE — Telephone Encounter (Signed)
Notified patient script will be faxed to pharmacy today. Add ibuprofen also.

## 2015-01-18 NOTE — Telephone Encounter (Signed)
Pt called stating that the burning and fever has gone away. Pt states that her kidneys are hurting bad. Pt wants to know if something can be called in for that.   [facility]

## 2015-01-18 NOTE — Telephone Encounter (Signed)
Ultram numb eighteen one q6hrs prn, add ibu also

## 2015-01-22 ENCOUNTER — Other Ambulatory Visit: Payer: Self-pay | Admitting: Family Medicine

## 2015-02-25 ENCOUNTER — Other Ambulatory Visit: Payer: Self-pay | Admitting: Nurse Practitioner

## 2015-04-28 ENCOUNTER — Other Ambulatory Visit: Payer: Self-pay | Admitting: Family Medicine

## 2015-04-30 ENCOUNTER — Ambulatory Visit: Payer: BLUE CROSS/BLUE SHIELD | Admitting: Family Medicine

## 2015-05-01 DIAGNOSIS — Z029 Encounter for administrative examinations, unspecified: Secondary | ICD-10-CM

## 2015-05-06 ENCOUNTER — Ambulatory Visit: Payer: BLUE CROSS/BLUE SHIELD | Admitting: Family Medicine

## 2015-05-07 ENCOUNTER — Encounter: Payer: Self-pay | Admitting: Family Medicine

## 2015-05-07 ENCOUNTER — Ambulatory Visit (INDEPENDENT_AMBULATORY_CARE_PROVIDER_SITE_OTHER): Payer: BLUE CROSS/BLUE SHIELD | Admitting: Family Medicine

## 2015-05-07 VITALS — BP 110/70 | Temp 98.5°F | Ht 67.0 in | Wt 282.2 lb

## 2015-05-07 DIAGNOSIS — J329 Chronic sinusitis, unspecified: Secondary | ICD-10-CM

## 2015-05-07 MED ORDER — CEFDINIR 300 MG PO CAPS: 300.0000 mg | ORAL_CAPSULE | Freq: Two times a day (BID) | ORAL | Status: DC

## 2015-05-07 NOTE — Progress Notes (Signed)
   Subjective:    Patient ID: [name], female    DOB: 1975-10-16, 40 y.o.   MRN: [id number]  Sinusitis This is a new problem. The current episode started 1 to 4 weeks ago. The problem has been gradually worsening since onset. Associated symptoms include coughing, ear pain, headaches, a hoarse voice and sinus pressure. Treatments tried: Mucinex, Mucinex sinus,Zyrtec.   Patient states no other concerns this visit. Allergies have kicked in  Having nose bleeds and irritation  Frontal headache and cough  Now using zyrtec  Right ear stoped up,  sistantial cough  Mid ear fluid  [place] [facility] gave pt a z pk, had sinus dranage and sore throat and  Hoarse for four days   Review of Systems  HENT: Positive for ear pain, hoarse voice and sinus pressure.   Respiratory: Positive for cough.   Neurological: Positive for headaches.       Objective:   Physical Exam  Alert, mild malaise. Hydration good Vitals stable. frontal/ maxillary tenderness evident positive nasal congestion. pharynx normal neck supple  lungs clear/no crackles or wheezes. heart regular in rhythm       Assessment & Plan:  Impression rhinosinusitis likely post viral, discussed with patient. plan antibiotics prescribed. Questions answered. Symptomatic care discussed. warning signs discussed. WSL

## 2015-05-27 ENCOUNTER — Encounter: Payer: Self-pay | Admitting: Family Medicine

## 2015-05-27 ENCOUNTER — Ambulatory Visit (INDEPENDENT_AMBULATORY_CARE_PROVIDER_SITE_OTHER): Payer: BLUE CROSS/BLUE SHIELD | Admitting: Family Medicine

## 2015-05-27 VITALS — BP 136/84 | Temp 98.9°F | Ht 67.0 in | Wt 280.0 lb

## 2015-05-27 DIAGNOSIS — R252 Cramp and spasm: Secondary | ICD-10-CM | POA: Diagnosis not present

## 2015-05-27 DIAGNOSIS — Z13220 Encounter for screening for lipoid disorders: Secondary | ICD-10-CM | POA: Diagnosis not present

## 2015-05-27 DIAGNOSIS — R5383 Other fatigue: Secondary | ICD-10-CM

## 2015-05-27 DIAGNOSIS — Z139 Encounter for screening, unspecified: Secondary | ICD-10-CM

## 2015-05-27 NOTE — Progress Notes (Signed)
   Subjective:    Patient ID: [name], female    DOB: 21-Jan-1976, 40 y.o.   MRN: [id number]  HPIjaw pain on left side of face. Started 2 days ago.   Friday eve had a transietn sharp discomfor  Sat day developed a deep sharp pain left lateral neck with pain and s[pasm  Felt painful and tight and spasm  Out of the blue discomfort  Tenderness persist  Having left ear pain and headache.    patient was worried about stroke recalls no injury  Review of Systems  no headache no chest pain no back pain no change in bowel habits    Objective:   Physical Exam   alert vital stable lungs clear heart rhythm H&T left lateral neck sternocleidomastoid region tender to palpation no adenopathy no masses      Assessment & Plan:   impression probable anterolateral neck spasm with subsequent strain plan rationale discussed no further testing on from tremendous and when necessary WSL

## 2015-05-28 ENCOUNTER — Other Ambulatory Visit: Payer: Self-pay | Admitting: Nurse Practitioner

## 2015-06-01 ENCOUNTER — Encounter: Payer: Self-pay | Admitting: Family Medicine

## 2015-06-01 LAB — LIPID PANEL
CHOL/HDL RATIO: 3.6 ratio (ref 0.0–4.4)
Cholesterol, Total: 165 mg/dL (ref 100–199)
HDL: 46 mg/dL (ref >39)
LDL CALC: 96 mg/dL (ref 0–99)
TRIGLYCERIDES: 114 mg/dL (ref 0–149)
VLDL CHOLESTEROL CAL: 23 mg/dL (ref 5–40)

## 2015-06-01 LAB — BASIC METABOLIC PANEL
BUN / CREAT RATIO: 14 (ref 8–20)
BUN: 12 mg/dL (ref 6–20)
CALCIUM: 8.7 mg/dL (ref 8.7–10.2)
CHLORIDE: 101 mmol/L (ref 96–106)
CO2: 23 mmol/L (ref 18–29)
Creatinine, Ser: 0.87 mg/dL (ref 0.57–1.00)
GFR calc Af Amer: 97 mL/min/{1.73_m2} (ref >59)
GFR calc non Af Amer: 84 mL/min/{1.73_m2} (ref >59)
GLUCOSE: 92 mg/dL (ref 65–99)
Potassium: 4.2 mmol/L (ref 3.5–5.2)
Sodium: 140 mmol/L (ref 134–144)

## 2015-06-01 LAB — HEPATIC FUNCTION PANEL
ALT: 15 IU/L (ref 0–32)
AST: 12 IU/L (ref 0–40)
Albumin: 4.1 g/dL (ref 3.5–5.5)
Alkaline Phosphatase: 82 IU/L (ref 39–117)
Bilirubin Total: 0.4 mg/dL (ref 0.0–1.2)
Bilirubin, Direct: 0.13 mg/dL (ref 0.00–0.40)
TOTAL PROTEIN: 6.3 g/dL (ref 6.0–8.5)

## 2015-06-01 LAB — MAGNESIUM: MAGNESIUM: 1.9 mg/dL (ref 1.6–2.3)

## 2015-06-01 LAB — TSH: TSH: 2.99 u[IU]/mL (ref 0.450–4.500)

## 2015-06-28 ENCOUNTER — Ambulatory Visit: Payer: BLUE CROSS/BLUE SHIELD | Admitting: Family Medicine

## 2015-07-15 ENCOUNTER — Other Ambulatory Visit: Payer: Self-pay | Admitting: Nurse Practitioner

## 2015-08-26 ENCOUNTER — Ambulatory Visit (INDEPENDENT_AMBULATORY_CARE_PROVIDER_SITE_OTHER): Payer: BLUE CROSS/BLUE SHIELD | Admitting: Nurse Practitioner

## 2015-08-26 ENCOUNTER — Encounter: Payer: Self-pay | Admitting: Nurse Practitioner

## 2015-08-26 VITALS — BP 130/82 | Ht 67.0 in | Wt 284.6 lb

## 2015-08-26 DIAGNOSIS — T148 Other injury of unspecified body region: Secondary | ICD-10-CM

## 2015-08-26 DIAGNOSIS — R233 Spontaneous ecchymoses: Secondary | ICD-10-CM | POA: Diagnosis not present

## 2015-08-26 DIAGNOSIS — W57XXXA Bitten or stung by nonvenomous insect and other nonvenomous arthropods, initial encounter: Secondary | ICD-10-CM

## 2015-08-26 MED ORDER — PREDNISONE 20 MG PO TABS: ORAL_TABLET | ORAL | Status: DC

## 2015-08-26 NOTE — Patient Instructions (Signed)
Loratadine in the morning Benadryl in the evening pepcid as directed

## 2015-08-26 NOTE — Progress Notes (Signed)
Subjective:  Presents for complaints of a rash on the right side that began 3 days ago. Was outside sitting in the heat during a ball game and felt something sting her in this area. A short time after that began having some itching and rash. Was out in the heat a good portion of the weekend. Has a picture of the initial rash on her phone which shows a light pink papular rash over the upper to mid right side from axillary area down to the bottom part of her bra. No fever or headache. No other rash. Rash is pruritic, no tenderness. No fatigue. No wheezing or trouble breathing.  Objective:   BP 130/82 mmHg  Ht 5\' 7"  (1.702 m)  Wt 284 lb 9.6 oz (129.094 kg)  BMI 44.56 kg/m2 NAD. Alert, oriented. Lungs clear. Heart regular rate rhythm. A nonraised petechial rash confluent in some areas noted localized to the right flank area with scattered rash in the axillary area and more linear around the bottom of her bra. Nontender to palpation.  Assessment: Insect bites  Petechial rash  Plan: With a distinct history of an insect bite as well as no tenderness noted, along with the rash being outside of a dermatomal line do not feel this is zoster. Meds ordered this encounter  Medications  . predniSONE (DELTASONE) 20 MG tablet    Sig: 3 po qd x 3 d then 2 po qd x 3 d then 1 po qd x 3 d    Dispense:  18 tablet    Refill:  0    Order Specific Question:  Supervising Provider    Answer:  Merlyn AlbertLUKING, WILLIAM S [[id number]]   OTC antihistamines along with Pepcid or Zantac as directed. Warning signs reviewed including fever headache persistent or spreading rash or excessive bruising or bleeding. Expect gradual resolution of symptoms, call back if any problems.

## 2015-08-27 ENCOUNTER — Telehealth: Payer: Self-pay | Admitting: Nurse Practitioner

## 2015-08-27 NOTE — Telephone Encounter (Signed)
Pt called stating that the rash is looking better but today she has a really bad headache and doesn't feel well. Please advise.

## 2015-08-28 NOTE — Telephone Encounter (Signed)
Any other symptoms such as fever?

## 2015-08-28 NOTE — Telephone Encounter (Signed)
LMRC

## 2015-08-30 NOTE — Telephone Encounter (Signed)
Left message on voicemail notifying patient that if she is still having trouble to give us a call back. (left message previously and patient has not called back)

## 2015-09-17 ENCOUNTER — Ambulatory Visit: Payer: BLUE CROSS/BLUE SHIELD | Admitting: Family Medicine

## 2015-09-24 ENCOUNTER — Encounter: Payer: Self-pay | Admitting: Family Medicine

## 2015-09-24 ENCOUNTER — Ambulatory Visit (INDEPENDENT_AMBULATORY_CARE_PROVIDER_SITE_OTHER): Payer: BLUE CROSS/BLUE SHIELD | Admitting: Family Medicine

## 2015-09-24 VITALS — BP 128/84 | Temp 98.7°F | Ht 67.0 in | Wt 284.1 lb

## 2015-09-24 DIAGNOSIS — J040 Acute laryngitis: Secondary | ICD-10-CM

## 2015-09-24 DIAGNOSIS — J019 Acute sinusitis, unspecified: Secondary | ICD-10-CM

## 2015-09-24 DIAGNOSIS — B9689 Other specified bacterial agents as the cause of diseases classified elsewhere: Secondary | ICD-10-CM

## 2015-09-24 DIAGNOSIS — J309 Allergic rhinitis, unspecified: Secondary | ICD-10-CM

## 2015-09-24 MED ORDER — METHYLPREDNISOLONE ACETATE 40 MG/ML IJ SUSP
40.0000 mg | Freq: Once | INTRAMUSCULAR | Status: AC
  Administered 2015-09-24: 40 mg via INTRAMUSCULAR

## 2015-09-24 MED ORDER — FLUCONAZOLE 150 MG PO TABS: 150.0000 mg | ORAL_TABLET | Freq: Once | ORAL | Status: DC

## 2015-09-24 MED ORDER — AMOXICILLIN-POT CLAVULANATE 875-125 MG PO TABS: 1.0000 | ORAL_TABLET | Freq: Two times a day (BID) | ORAL | Status: DC

## 2015-09-24 NOTE — Progress Notes (Signed)
   Subjective:    Patient ID: [name], female    DOB: June 04, 1975, 40 y.o.   MRN: [id number]  Sinusitis This is a new problem. The current episode started 1 to 4 weeks ago. There has been no fever. Associated symptoms include coughing, ear pain, headaches, sinus pressure and a sore throat. (Wheezing) Past treatments include oral decongestants.   Patient with a few weeks head congestion drainage coughing sinus pressure at times feels a little wheezy in her chest but no shortness of breath or time Patient states no other concerns this visit.  Review of Systems  HENT: Positive for ear pain, sinus pressure and sore throat.   Respiratory: Positive for cough.   Neurological: Positive for headaches.       Objective:   Physical Exam  Constitutional: She appears well-developed.  HENT:  Head: Normocephalic.  Nose: Nose normal.  Mouth/Throat: Oropharynx is clear and moist. No oropharyngeal exudate.  Neck: Neck supple.  Cardiovascular: Normal rate and normal heart sounds.   No murmur heard. Pulmonary/Chest: Effort normal and breath sounds normal. She has no wheezes.  Lymphadenopathy:    She has no cervical adenopathy.  Skin: Skin is warm and dry.  Nursing note and vitals reviewed.   Patient with hoarseness probably related to allergies over the past couple weeks I believe that there is some secondary sinusitis going on as well No evidence of pneumonia on exam     Assessment & Plan:  Patient was seen today for upper respiratory illness. It is felt that the patient is dealing with sinusitis. Antibiotics were prescribed today. Importance of compliance with medication was discussed. Symptoms should gradually resolve over the course of the next several days. If high fevers, progressive illness, difficulty breathing, worsening condition or failure for symptoms to improve over the next several days then the patient is to follow-up. If any emergent conditions the patient is to follow-up in  the emergency department otherwise to follow-up in the office.

## 2015-10-07 ENCOUNTER — Telehealth: Payer: Self-pay | Admitting: Family Medicine

## 2015-10-07 MED ORDER — AMOXICILLIN-POT CLAVULANATE 875-125 MG PO TABS: 1.0000 | ORAL_TABLET | Freq: Two times a day (BID) | ORAL | 0 refills | Status: DC

## 2015-10-07 NOTE — Telephone Encounter (Signed)
Prescription sent electronically to pharmacy. Patient notified. 

## 2015-10-07 NOTE — Telephone Encounter (Signed)
Aug 875 bid ten d 

## 2015-10-07 NOTE — Telephone Encounter (Signed)
Pt called stating that she has finished the antibiotics that Dr. Lorin Picket prescribed and feels as though she may need another round.     [facility]

## 2015-10-25 ENCOUNTER — Encounter: Payer: Self-pay | Admitting: Family Medicine

## 2015-10-25 ENCOUNTER — Ambulatory Visit (INDEPENDENT_AMBULATORY_CARE_PROVIDER_SITE_OTHER): Payer: BLUE CROSS/BLUE SHIELD | Admitting: Family Medicine

## 2015-10-25 VITALS — BP 128/82 | Ht 67.0 in | Wt 283.4 lb

## 2015-10-25 DIAGNOSIS — R079 Chest pain, unspecified: Secondary | ICD-10-CM

## 2015-10-25 DIAGNOSIS — K219 Gastro-esophageal reflux disease without esophagitis: Secondary | ICD-10-CM | POA: Diagnosis not present

## 2015-10-25 MED ORDER — PANTOPRAZOLE SODIUM 40 MG PO TBEC: DELAYED_RELEASE_TABLET | ORAL | 2 refills | Status: DC

## 2015-10-25 NOTE — Progress Notes (Signed)
   Subjective:    Patient ID: [name], female    DOB: 1975/08/21, 40 y.o.   MRN: [id number]  HPI Patient arrives with c/o chest burning and reflux this week-woke her up twice at night.  Noticed more discomfort in the chest  Not severe, but did wake pt up  Feels rad to back at times  Last night flared up again  Felt a little gassy. tookpos fam hx in moms family  Sweating at times and feeling fatigued  Feeling tired a lot  Pos fam hx of ca     Took zantac not much help    Took protonix for awhile and then did not need nything   No pain with walking, but not exercising reg   Patient notes chest pain. Subsequent sternal. Left-sided more than right no association shortness of breath nausea or diaphoresis. Seems to be worse at night. Some taste in her throat. Some family history of heart disease anxious about this. Not exertional   Review of Systems No headache, no major weight loss or weight gain, no chest pain no back pain abdominal pain no change in bowel habits complete ROS otherwise negative     Objective:   Physical Exam  Alert vitals stable, NAD. Blood pressure good on repeat. HEENT normal. Lungs clear. Heart regular rate and rhythm. No left anterior chest tenderness to palpation. No epigastric tenderness.  EKG normal sinus rhythm. No significant ST-T changes      Assessment & Plan:  Impression 1 chest discomfort very atypical for coronary artery disease. Discussed at length. Highly likely that esophagitis/reflux are the culprit. Plan initiate Protonix. Expect this to calm down. Warning signs discussed carefully WSL

## 2015-10-28 ENCOUNTER — Encounter: Payer: Self-pay | Admitting: Family Medicine

## 2016-01-06 ENCOUNTER — Ambulatory Visit: Payer: BLUE CROSS/BLUE SHIELD | Admitting: Family Medicine

## 2016-01-07 ENCOUNTER — Ambulatory Visit: Payer: BLUE CROSS/BLUE SHIELD | Admitting: Family Medicine

## 2016-01-22 ENCOUNTER — Encounter: Payer: Self-pay | Admitting: Nurse Practitioner

## 2016-01-22 ENCOUNTER — Ambulatory Visit (INDEPENDENT_AMBULATORY_CARE_PROVIDER_SITE_OTHER): Payer: BLUE CROSS/BLUE SHIELD | Admitting: Nurse Practitioner

## 2016-01-22 VITALS — BP 118/86 | Temp 98.9°F | Ht 68.0 in | Wt 290.0 lb

## 2016-01-22 DIAGNOSIS — B370 Candidal stomatitis: Secondary | ICD-10-CM | POA: Diagnosis not present

## 2016-01-22 LAB — POCT GLYCOSYLATED HEMOGLOBIN (HGB A1C): Hemoglobin A1C: 5.7

## 2016-01-22 MED ORDER — FLUCONAZOLE 100 MG PO TABS: ORAL_TABLET | ORAL | 0 refills | Status: DC

## 2016-01-22 MED ORDER — NYSTATIN 100000 UNIT/ML MT SUSP: 5.0000 mL | Freq: Four times a day (QID) | OROMUCOSAL | 0 refills | Status: DC

## 2016-01-22 MED ORDER — PHENTERMINE HCL 37.5 MG PO TABS: 37.5000 mg | ORAL_TABLET | Freq: Every day | ORAL | 2 refills | Status: DC

## 2016-01-22 NOTE — Progress Notes (Signed)
Subjective:  Presents for complaints of thrush in her mouth for the past 6 days. Began after a 10 day course of Cipro given by her gynecologist for a UTI. Has made her mouth and throat sore. No fever. Off-and-on sinus headache. Some ear pain/pressure. No cough runny nose or wheezing. Some head congestion. Would also like to start phentermine to help with weight loss. Took this several years ago without any difficulty. Plans to restart activity in the near future.  Objective:   BP 118/86   Temp 98.9 F (37.2 C) (Oral)   Ht 5\' 8"  (1.727 m)   Wt 290 lb (131.5 kg)   BMI 44.09 kg/m  NAD. Alert, oriented. TMs very retracted bilateral, no erythema. Pharynx nonerythematous. Tongue is covered with a thin white to yellowish film. Neck supple with mild tender anterior adenopathy. Lungs clear. Heart regular rate rhythm. Results for orders placed or performed in visit on 01/22/16  POCT glycosylated hemoglobin (Hb A1C)  Result Value Ref Range   Hemoglobin A1C 5.7      Assessment:  Problem List Items Addressed This Visit    None    Visit Diagnoses    Oral candidiasis    -  Primary   Relevant Medications   fluconazole (DIFLUCAN) 100 MG tablet   nystatin (MYCOSTATIN) 100000 UNIT/ML suspension   Other Relevant Orders   POCT glycosylated hemoglobin (Hb A1C) (Completed)   Morbid obesity (HCC)       Relevant Medications   phentermine (ADIPEX-P) 37.5 MG tablet   Other Relevant Orders   POCT glycosylated hemoglobin (Hb A1C) (Completed)     Plan:  Meds ordered this encounter  Medications  . fluconazole (DIFLUCAN) 100 MG tablet    Sig: 2 po today then one po qd x 14 d    Dispense:  16 tablet    Refill:  0    Order Specific Question:   Supervising Provider    Answer:   Merlyn AlbertLUKING, WILLIAM S [[id number]]  . phentermine (ADIPEX-P) 37.5 MG tablet    Sig: Take 1 tablet (37.5 mg total) by mouth daily before breakfast.    Dispense:  30 tablet    Refill:  2    Order Specific Question:   Supervising Provider    Answer:   Merlyn AlbertLUKING, WILLIAM S [[id number]]  . nystatin (MYCOSTATIN) 100000 UNIT/ML suspension    Sig: Take 5 mLs (500,000 Units total) by mouth 4 (four) times daily.    Dispense:  60 mL    Refill:  0    Order Specific Question:   Supervising Provider    Answer:   Merlyn AlbertLUKING, WILLIAM S [[id number]]   Recommend regular activity and weight loss. Start Diflucan as directed. End of visit patient requested an oral suspension to help with candidiasis, start nystatin as directed when necessary. Call back in 10-14 days if symptoms persist, sooner if worse. Otherwise follow-up in 3 months if she wishes to continue phentermine.

## 2016-03-03 ENCOUNTER — Other Ambulatory Visit: Payer: Self-pay | Admitting: Family Medicine

## 2016-03-03 ENCOUNTER — Ambulatory Visit (INDEPENDENT_AMBULATORY_CARE_PROVIDER_SITE_OTHER): Payer: BLUE CROSS/BLUE SHIELD | Admitting: Family Medicine

## 2016-03-03 ENCOUNTER — Encounter: Payer: Self-pay | Admitting: Family Medicine

## 2016-03-03 VITALS — BP 130/82 | Temp 98.3°F | Ht 68.0 in | Wt 274.2 lb

## 2016-03-03 DIAGNOSIS — J019 Acute sinusitis, unspecified: Secondary | ICD-10-CM

## 2016-03-03 DIAGNOSIS — B9689 Other specified bacterial agents as the cause of diseases classified elsewhere: Secondary | ICD-10-CM

## 2016-03-03 MED ORDER — CEFDINIR 300 MG PO CAPS: 300.0000 mg | ORAL_CAPSULE | Freq: Two times a day (BID) | ORAL | 0 refills | Status: DC

## 2016-03-03 NOTE — Progress Notes (Signed)
   Subjective:    Patient ID: [name], female    DOB: Jun 10, 1975, 40 y.o.   MRN: [id number]  Sinusitis  This is a new problem. The current episode started in the past 7 days. The problem is unchanged. There has been no fever. The pain is moderate. Associated symptoms include congestion, coughing, ear pain and a sore throat. Past treatments include oral decongestants. The treatment provided no relief.   ++  Seven d ago struck with coough and congest  Now headache frontal, sore throat, ear discomfort  Prn meds  progresssive productive cough, burning with swallowing , hurt ot cough   Sharp pain  Review of Systems  HENT: Positive for congestion, ear pain and sore throat.   Respiratory: Positive for cough.        Objective:   Physical Exam  Alert, mild malaise. Hydration good Vitals stable. frontal/ maxillary tenderness evident positive nasal congestion. pharynx normal neck supple  lungs clear/no crackles or wheezes. heart regular in rhythm       Assessment & Plan:  Impression rhinosinusitis likely post viral, discussed with patient. plan antibiotics prescribed. Questions answered. Symptomatic care discussed. warning signs discussed. WSL

## 2016-03-04 ENCOUNTER — Telehealth: Payer: Self-pay | Admitting: Family Medicine

## 2016-03-04 MED ORDER — ALBUTEROL SULFATE HFA 108 (90 BASE) MCG/ACT IN AERS: 2.0000 | INHALATION_SPRAY | Freq: Four times a day (QID) | RESPIRATORY_TRACT | 0 refills | Status: DC | PRN

## 2016-03-04 NOTE — Telephone Encounter (Signed)
Prescription sent electronically to pharmacy. Patient notified. 

## 2016-03-04 NOTE — Telephone Encounter (Signed)
Albuterol two spraqs qid prn

## 2016-03-04 NOTE — Telephone Encounter (Signed)
Patient was seen on 03/03/16 for acute bacterial rhinosinusitis.  She is requesting an inhaler to be called in because she thinks it'll help with her wheezing.  [facility] [place] [address]

## 2016-03-10 ENCOUNTER — Telehealth: Payer: Self-pay | Admitting: Family Medicine

## 2016-03-10 MED ORDER — AMOXICILLIN-POT CLAVULANATE 875-125 MG PO TABS: 1.0000 | ORAL_TABLET | Freq: Two times a day (BID) | ORAL | 0 refills | Status: DC

## 2016-03-10 NOTE — Telephone Encounter (Signed)
Patient notified med sent to pharmacy.  

## 2016-03-10 NOTE — Telephone Encounter (Signed)
Aug 875 bid ten d 

## 2016-03-10 NOTE — Telephone Encounter (Signed)
Patient seen on 03/03/16 for acute bacterial rhinosinusitis by Dr. Brett CanalesSteve and prescribed cefdinir.  She is still having congestion, sinus drainage and pressure in ear.  She wants to know if we can try her on another antibiotic.  [facility] [address]

## 2016-03-10 NOTE — Telephone Encounter (Signed)
Patient was recently seen and prescribed cefdinir.  She is still having congestion, sinus drainage and pressure in her ears.

## 2016-03-20 ENCOUNTER — Ambulatory Visit: Payer: BLUE CROSS/BLUE SHIELD | Admitting: Family Medicine

## 2016-05-07 ENCOUNTER — Emergency Department
Admission: EM | Admit: 2016-05-07 | Discharge: 2016-05-07 | Disposition: A | Discharge status: Home / Self Care | Payer: BLUE CROSS/BLUE SHIELD | Attending: Emergency Medicine | Admitting: Emergency Medicine

## 2016-05-07 ENCOUNTER — Emergency Department: Payer: BLUE CROSS/BLUE SHIELD

## 2016-05-07 DIAGNOSIS — Z79899 Other long term (current) drug therapy: Secondary | ICD-10-CM | POA: Insufficient documentation

## 2016-05-07 DIAGNOSIS — R079 Chest pain, unspecified: Secondary | ICD-10-CM | POA: Diagnosis present

## 2016-05-07 DIAGNOSIS — Z5321 Procedure and treatment not carried out due to patient leaving prior to being seen by health care provider: Secondary | ICD-10-CM | POA: Diagnosis not present

## 2016-05-07 LAB — CBC
HCT: 40.5 % (ref 35.0–47.0)
Hemoglobin: 13.7 g/dL (ref 12.0–16.0)
MCH: 28.2 pg (ref 26.0–34.0)
MCHC: 33.9 g/dL (ref 32.0–36.0)
MCV: 83.2 fL (ref 80.0–100.0)
PLATELETS: 256 10*3/uL (ref 150–440)
RBC: 4.87 MIL/uL (ref 3.80–5.20)
RDW: 14 % (ref 11.5–14.5)
WBC: 6.5 10*3/uL (ref 3.6–11.0)

## 2016-05-07 LAB — COMPREHENSIVE METABOLIC PANEL
ALT: 18 U/L (ref 14–54)
AST: 20 U/L (ref 15–41)
Albumin: 3.9 g/dL (ref 3.5–5.0)
Alkaline Phosphatase: 72 U/L (ref 38–126)
Anion gap: 8 (ref 5–15)
BUN: 12 mg/dL (ref 6–20)
CHLORIDE: 106 mmol/L (ref 101–111)
CO2: 26 mmol/L (ref 22–32)
Calcium: 8.8 mg/dL — ABNORMAL LOW (ref 8.9–10.3)
Creatinine, Ser: 0.98 mg/dL (ref 0.44–1.00)
GFR calc Af Amer: >60 mL/min (ref >60)
Glucose, Bld: 106 mg/dL — ABNORMAL HIGH (ref 65–99)
POTASSIUM: 3.6 mmol/L (ref 3.5–5.1)
Sodium: 140 mmol/L (ref 135–145)
Total Bilirubin: 0.6 mg/dL (ref 0.3–1.2)
Total Protein: 7.1 g/dL (ref 6.5–8.1)

## 2016-05-07 LAB — TROPONIN I

## 2016-05-07 NOTE — ED Triage Notes (Addendum)
Pt presents to ED via POV with c/o central CP with radiation into her back between the shoulder blades that started over the "past few days". Pt denies any c/o N/V/D, denies SHOB, but reports "burning sensation" in central chest and between shoulder blades with diaphoresis. Pt states that she thinks "it might be heartburn, but I'm not sure, and I'd rather be safe than sorry". Pt reports taking ASA (325mg ) PTA. Pt is A&O, in NAD, speaking clearly and in complete sentences, with respirations even, regular, and unlabored.

## 2016-05-07 NOTE — ED Notes (Signed)
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

## 2016-05-15 ENCOUNTER — Other Ambulatory Visit: Payer: Self-pay | Admitting: Nurse Practitioner

## 2016-05-21 ENCOUNTER — Telehealth: Payer: Self-pay | Admitting: Family Medicine

## 2016-05-21 NOTE — Telephone Encounter (Signed)
Patient's Phetermine is approved through 04/21/2016-05/11/2017. Patient notified and verbalized understanding.

## 2016-06-18 ENCOUNTER — Other Ambulatory Visit: Payer: Self-pay | Admitting: *Deleted

## 2016-06-18 MED ORDER — FLUTICASONE PROPIONATE 50 MCG/ACT NA SUSP: 2.0000 | Freq: Every day | NASAL | 3 refills | Status: DC

## 2016-10-16 ENCOUNTER — Ambulatory Visit: Payer: BLUE CROSS/BLUE SHIELD | Admitting: Nurse Practitioner

## 2016-10-19 ENCOUNTER — Ambulatory Visit (INDEPENDENT_AMBULATORY_CARE_PROVIDER_SITE_OTHER): Payer: BLUE CROSS/BLUE SHIELD | Admitting: Nurse Practitioner

## 2016-10-19 ENCOUNTER — Encounter: Payer: Self-pay | Admitting: Nurse Practitioner

## 2016-10-19 VITALS — BP 122/80 | Ht 68.0 in | Wt 275.2 lb

## 2016-10-19 DIAGNOSIS — Z91018 Allergy to other foods: Secondary | ICD-10-CM | POA: Diagnosis not present

## 2016-10-19 DIAGNOSIS — K219 Gastro-esophageal reflux disease without esophagitis: Secondary | ICD-10-CM

## 2016-10-19 DIAGNOSIS — R1313 Dysphagia, pharyngeal phase: Secondary | ICD-10-CM | POA: Diagnosis not present

## 2016-10-19 MED ORDER — PANTOPRAZOLE SODIUM 40 MG PO TBEC: DELAYED_RELEASE_TABLET | ORAL | 2 refills | Status: DC

## 2016-10-19 NOTE — Progress Notes (Signed)
Subjective:  Presents for recheck on her reflux disease. Was off her Protonix for 2-3 months, her symptoms had resolved during that time. Came back about a month ago. On 7/14 patient states she was eating a shrimp and it became lodged in her throat when she tried to swallow, thinks a piece of shell may of been attached. Patient was finally able to drink a large amount of water and wash down the shrimp. Since then has noted "a knot in her throat". Her last EGD was done in 2014. Has had difficulty swallowing since this incident. No fever or sore throat. Nausea but no vomiting. Hoarseness. Coughing with eating. Occasional mild diarrhea or constipation but nothing persistent. Nonsmoker. Very rare social alcohol use. Drinks large amount of caffeine. Takes some ibuprofen 2-3/7 days of the week. No change in the color of her stools. Patient eats meat as a main portion of her diet. Symptoms worse with food such as pork barbecue unclear whether it's the pork or the spices.  Objective:   BP 122/80   Ht 5\' 8"  (1.727 m)   Wt 275 lb 3.2 oz (124.8 kg)   BMI 41.84 kg/m  NAD. Alert, oriented. Thyroid nontender to palpation, no mass or goiter noted. Lungs clear. Heart regular rate rhythm. Abdomen obese soft nondistended with mild epigastric area discomfort especially the upper part of the epigastric area. No rebound or guarding. No obvious masses.  Assessment:   Problem List Items Addressed This Visit      Digestive   GERD - Primary   Relevant Medications   pantoprazole (PROTONIX) 40 MG tablet    Other Visit Diagnoses    Possible Allergy to meat       Relevant Orders   Alpha-Gal Panel       Plan:   Meds ordered this encounter  Medications  . pantoprazole (PROTONIX) 40 MG tablet    Sig: TAKE 1 TABLET BY MOUTH DAILY FOR ACID REFLUX    Dispense:  30 tablet    Refill:  2    Order Specific Question:   Supervising Provider    Answer:   Merlyn AlbertLUKING, WILLIAM S [[id number]]   Restart Protonix daily. Reviewed risks  associated with long-term PPI use. Refer back to GI for further evaluation particularly difficulty swallowing. Call back here in the meantime if symptoms worsen. Slowly wean off caffeine. Discussed dietary measures affecting her symptoms.

## 2016-10-23 ENCOUNTER — Encounter: Payer: Self-pay | Admitting: Family Medicine

## 2016-10-26 ENCOUNTER — Encounter: Payer: Self-pay | Admitting: Internal Medicine

## 2016-10-29 ENCOUNTER — Encounter: Payer: Self-pay | Admitting: Family Medicine

## 2016-10-29 ENCOUNTER — Ambulatory Visit (INDEPENDENT_AMBULATORY_CARE_PROVIDER_SITE_OTHER): Payer: BLUE CROSS/BLUE SHIELD | Admitting: Family Medicine

## 2016-10-29 VITALS — BP 118/70 | Temp 99.3°F | Ht 68.0 in | Wt 279.0 lb

## 2016-10-29 DIAGNOSIS — N10 Acute tubulo-interstitial nephritis: Secondary | ICD-10-CM

## 2016-10-29 DIAGNOSIS — R300 Dysuria: Secondary | ICD-10-CM | POA: Diagnosis not present

## 2016-10-29 MED ORDER — PHENAZOPYRIDINE HCL 200 MG PO TABS: 200.0000 mg | ORAL_TABLET | Freq: Three times a day (TID) | ORAL | 0 refills | Status: DC | PRN

## 2016-10-29 MED ORDER — ONDANSETRON 4 MG PO TBDP: 4.0000 mg | ORAL_TABLET | Freq: Four times a day (QID) | ORAL | 0 refills | Status: DC | PRN

## 2016-10-29 MED ORDER — CIPROFLOXACIN HCL 500 MG PO TABS: 500.0000 mg | ORAL_TABLET | Freq: Two times a day (BID) | ORAL | 0 refills | Status: DC

## 2016-10-29 NOTE — Progress Notes (Signed)
   Subjective:    Patient ID: [name], female    DOB: 1975-04-29, 41 y.o.   MRN: [id number]  Urinary Tract Infection   This is a new problem. Episode onset: 4 days. Associated symptoms comments: Abdominal pain, painful urination, vomiting. Treatments tried: azo.   Sig incr freq   Low abd tend and pain   Pos dysuria  No fever no chills  achey   f    Review of Systems No headache, no major weight loss or weight gain, no chest pain no back pain abdominal pain no change in bowel habits complete ROS otherwise negative     Objective:   Physical Exam Alert vitals stable, NAD. Blood pressure good on repeat. HEENT normal. Lungs clear. Heart regular rate and rhythm. True left CVA tenderness positive suprapubic tenderness mild malaise   Urinalysis 60 white blood cells high-powered field    Assessment & Plan:  Impression urinary tract infection with probable pyelonephritis present plan warning signs discussed symptom care discussed Zofran when necessary. Cipro 10 days

## 2016-11-01 LAB — URINE CULTURE

## 2016-11-01 LAB — PLEASE NOTE

## 2016-11-10 ENCOUNTER — Other Ambulatory Visit: Payer: Self-pay | Admitting: *Deleted

## 2016-11-10 ENCOUNTER — Telehealth: Payer: Self-pay | Admitting: Family Medicine

## 2016-11-10 MED ORDER — CIPROFLOXACIN HCL 500 MG PO TABS: 500.0000 mg | ORAL_TABLET | Freq: Two times a day (BID) | ORAL | 0 refills | Status: DC

## 2016-11-10 NOTE — Telephone Encounter (Signed)
Seen 8/23. Given cipro for 10 days. Urine culture showed enterococcus faecalis.

## 2016-11-10 NOTE — Telephone Encounter (Signed)
Patient was seen on 10/29/16 by Dr. Brett CanalesSteve for acute pyelonephritis.  She said she took antibiotics for this and she started to feel better.  She said she is not having pain in bladder with urination, but it started to hurt in her back/kidney area again.  [facility] [address]

## 2016-11-10 NOTE — Telephone Encounter (Signed)
Discussed with pt. Med sent to pharm.  

## 2016-11-10 NOTE — Telephone Encounter (Signed)
One more round cipro same dose

## 2016-12-10 ENCOUNTER — Other Ambulatory Visit: Payer: Self-pay

## 2016-12-10 MED ORDER — FLUTICASONE PROPIONATE 50 MCG/ACT NA SUSP: 2.0000 | Freq: Every day | NASAL | 5 refills | Status: DC

## 2016-12-11 ENCOUNTER — Other Ambulatory Visit: Payer: Self-pay | Admitting: *Deleted

## 2016-12-11 MED ORDER — FLUTICASONE PROPIONATE 50 MCG/ACT NA SUSP: 2.0000 | Freq: Every day | NASAL | 5 refills | Status: DC

## 2016-12-16 ENCOUNTER — Ambulatory Visit (INDEPENDENT_AMBULATORY_CARE_PROVIDER_SITE_OTHER): Payer: Managed Care, Other (non HMO) | Admitting: Nurse Practitioner

## 2016-12-16 ENCOUNTER — Encounter: Payer: Self-pay | Admitting: Nurse Practitioner

## 2016-12-16 VITALS — BP 122/82 | Ht 68.0 in | Wt 282.4 lb

## 2016-12-16 DIAGNOSIS — K219 Gastro-esophageal reflux disease without esophagitis: Secondary | ICD-10-CM

## 2016-12-16 MED ORDER — PANTOPRAZOLE SODIUM 40 MG PO TBEC: DELAYED_RELEASE_TABLET | ORAL | 2 refills | Status: DC

## 2016-12-16 MED ORDER — FLUTICASONE PROPIONATE 50 MCG/ACT NA SUSP: 2.0000 | Freq: Every day | NASAL | 5 refills | Status: DC

## 2016-12-16 MED ORDER — PHENTERMINE HCL 37.5 MG PO TABS: 37.5000 mg | ORAL_TABLET | Freq: Every day | ORAL | 2 refills | Status: DC

## 2016-12-16 NOTE — Progress Notes (Signed)
Subjective:  Presents for recheck on her GERD. Doing very well on pantoprazole daily. Reflux completely improved. No further "knot" in her throat, this resolved fairly quickly after last visit. No abdominal pain. No nausea vomiting. Again struggling with her weight, started back drinking regular Pepsi 2 of the 20 ounces bottles per day. Also stopped going to the gym until about 2 weeks ago. Has started back on her exercise program. Also getting on the treadmill after work. Gets her Pap smear lab work and mammogram through gynecologist in [place]. Lab work unavailable during office visit. Would like to restart phentermine, has taken this without difficulty in the past.  Objective:   BP 122/82   Ht  (1.727 m)   Wt 282 lb 6.4 oz (128.1 kg)   BMI 42.94 kg/m  NAD. Alert, oriented. Lungs clear. Heart regular rate rhythm. Abdomen soft nondistended nontender.  Assessment:   Problem List Items Addressed This Visit      Digestive   GERD - Primary   Relevant Medications   pantoprazole (PROTONIX) 40 MG tablet    Other Visit Diagnoses    Morbid obesity (HCC)       Relevant Medications   phentermine (ADIPEX-P) 37.5 MG tablet       Plan:   Meds ordered this encounter  Medications  . phentermine (ADIPEX-P) 37.5 MG tablet    Sig: Take 1 tablet (37.5 mg total) by mouth daily before breakfast.    Dispense:  30 tablet    Refill:  2    Order Specific Question:   Supervising Provider    Answer:   Merlyn Albert [[id number]]  . fluticasone (FLONASE) 50 MCG/ACT nasal spray    Sig: Place 2 sprays into both nostrils daily.    Dispense:  16 g    Refill:  5    Order Specific Question:   Supervising Provider    Answer:   Merlyn Albert [[id number]]  . pantoprazole (PROTONIX) 40 MG tablet    Sig: TAKE 1 TABLET BY MOUTH DAILY FOR ACID REFLUX    Dispense:  30 tablet    Refill:  2    Order Specific Question:   Supervising Provider    Answer:   Merlyn Albert [[id number]]   A hiatal hernia was noted on  her EGD in 2014, highly suspect sliding hiatal hernia was causing her throat symptoms at one point. These have resolved. Continue Protonix as directed. If symptoms improve over time recommend cutting back to as needed dosing. Restart phentermine as directed. Recommend patient stop all intake of regular soda and continue her regular exercise regimen. Return in about 6 months (around 06/16/2017) for recheck gerd. Recheck sooner if she wishes to continue weight loss medicine.

## 2016-12-23 ENCOUNTER — Ambulatory Visit: Payer: BLUE CROSS/BLUE SHIELD | Admitting: Gastroenterology

## 2017-01-19 ENCOUNTER — Encounter: Payer: Self-pay | Admitting: Family Medicine

## 2017-01-19 ENCOUNTER — Ambulatory Visit: Payer: Managed Care, Other (non HMO) | Admitting: Family Medicine

## 2017-01-19 VITALS — BP 122/82 | Temp 98.1°F | Ht 68.0 in | Wt 289.6 lb

## 2017-01-19 DIAGNOSIS — J019 Acute sinusitis, unspecified: Secondary | ICD-10-CM

## 2017-01-19 DIAGNOSIS — B9689 Other specified bacterial agents as the cause of diseases classified elsewhere: Secondary | ICD-10-CM | POA: Diagnosis not present

## 2017-01-19 DIAGNOSIS — H6501 Acute serous otitis media, right ear: Secondary | ICD-10-CM

## 2017-01-19 MED ORDER — CEFDINIR 300 MG PO CAPS: 300.0000 mg | ORAL_CAPSULE | Freq: Two times a day (BID) | ORAL | 0 refills | Status: DC

## 2017-01-19 NOTE — Progress Notes (Signed)
   Subjective:    Patient ID: [name], female    DOB: [birth year], 41 y.o.   MRN: [id number]  Cough  This is a new problem. The current episode started in the past 7 days. Associated symptoms include ear pain, headaches, nasal congestion and wheezing. She has tried OTC cough suppressant for the symptoms.   Last week progressive cough and cong and runny nose  Now into the chest  Right ear stopped up and umcomfortabl, also getting vertigo,  Using extre steam and robitussin   Review of Systems  HENT: Positive for ear pain.   Respiratory: Positive for cough and wheezing.   Neurological: Positive for headaches.       Objective:   Physical Exam  Alert, mild malaise. Hydration good Vitals stable.  Right otitis media present frontal/ maxillary tenderness evident positive nasal congestion. pharynx normal neck supple  lungs clear/no crackles or wheezes. heart regular in rhythm      Assessment & Plan:  Impression rhinosinusitis likely post viral, discussed with patient. plan antibiotics prescribed. Questions answered. Symptomatic care discussed. warning signs discussed. WSL Plus right otitis media

## 2017-01-22 ENCOUNTER — Telehealth: Payer: Self-pay | Admitting: Family Medicine

## 2017-01-22 MED ORDER — PREDNISONE 20 MG PO TABS
ORAL_TABLET | ORAL | 0 refills | Status: DC
Start: 2017-01-22 — End: 2017-07-16

## 2017-01-22 MED ORDER — BENZONATATE 100 MG PO CAPS: ORAL_CAPSULE | ORAL | 0 refills | Status: DC

## 2017-01-22 MED ORDER — CLARITHROMYCIN 500 MG PO TABS: 500.0000 mg | ORAL_TABLET | Freq: Two times a day (BID) | ORAL | 0 refills | Status: DC

## 2017-01-22 NOTE — Telephone Encounter (Signed)
Patient seen Dr. Brett CanalesSteve on 01/19/17 for otitis media.  She said her chest stills feels tight, tighter now than the other day.  Also, she has a bad cough and its hurts to cough.  She still has ear pain.  She has been having to use her inhaler more now, especially yesterday which she says is the worst day.  She wants to know if we can change her antibiotic and maybe give her something for her cough.  [facility] [place] [place]

## 2017-01-22 NOTE — Telephone Encounter (Signed)
Discussed with Dr.Steve Luking and received verbal orders to order Biaxin 500 MG BID for 10 days. Prednisone Adult Taper, and Tessalon 100 MG 1 TID as needed for cough #30

## 2017-01-22 NOTE — Telephone Encounter (Signed)
Spoke with patient and informed her per Dr.Steve Luking- We are sending in Biaxin, Tessalon, and Prednisone Taper. Patient verbalized understanding.

## 2017-02-03 ENCOUNTER — Encounter: Payer: Self-pay | Admitting: Family Medicine

## 2017-02-03 NOTE — Telephone Encounter (Signed)
ERROR

## 2017-03-10 ENCOUNTER — Telehealth: Payer: Self-pay

## 2017-03-10 NOTE — Telephone Encounter (Signed)
o v later this wk in meanttime symto care for headache

## 2017-03-10 NOTE — Telephone Encounter (Signed)
Patient called states she has been having headaches for the last several days which prompted her to check her BP.She states on 03/08/2017 Bp was 138/94,148/98,03/09/2017 125/94,today 03/10/2017 147/87 this is not normal for her she says her bp are never this high.She state when she Is lying down and checks the bp it is normal 122/78. Please advise.Thanks.

## 2017-03-10 NOTE — Telephone Encounter (Signed)
Patient is aware 

## 2017-03-11 ENCOUNTER — Ambulatory Visit: Payer: Managed Care, Other (non HMO) | Admitting: Family Medicine

## 2017-04-16 ENCOUNTER — Ambulatory Visit: Payer: Managed Care, Other (non HMO) | Admitting: Nurse Practitioner

## 2017-04-21 ENCOUNTER — Ambulatory Visit: Payer: Managed Care, Other (non HMO) | Admitting: Nurse Practitioner

## 2017-04-21 DIAGNOSIS — Z029 Encounter for administrative examinations, unspecified: Secondary | ICD-10-CM

## 2017-05-12 ENCOUNTER — Telehealth: Payer: Self-pay | Admitting: Family Medicine

## 2017-05-12 DIAGNOSIS — Z0000 Encounter for general adult medical examination without abnormal findings: Secondary | ICD-10-CM

## 2017-05-12 DIAGNOSIS — Z13220 Encounter for screening for lipoid disorders: Secondary | ICD-10-CM

## 2017-05-12 DIAGNOSIS — R5383 Other fatigue: Secondary | ICD-10-CM

## 2017-05-12 NOTE — Telephone Encounter (Signed)
Lip liv m7 tsh cbc 

## 2017-05-12 NOTE — Telephone Encounter (Signed)
Patient has physical scheduled on 3/22 and needing labs done she wants her thyroid checked also

## 2017-05-12 NOTE — Telephone Encounter (Signed)
Last bw ordered by dr Brett Canalessteve march 2017 lipid, liver, bmp, tsh, magnessium.

## 2017-05-13 NOTE — Telephone Encounter (Signed)
Lab orders put in. Pt notified.  

## 2017-05-17 ENCOUNTER — Encounter: Payer: Self-pay | Admitting: Family Medicine

## 2017-05-28 ENCOUNTER — Encounter: Payer: Managed Care, Other (non HMO) | Admitting: Family Medicine

## 2017-06-02 ENCOUNTER — Encounter: Payer: Managed Care, Other (non HMO) | Admitting: Family Medicine

## 2017-06-17 ENCOUNTER — Encounter: Payer: Managed Care, Other (non HMO) | Admitting: Family Medicine

## 2017-06-17 DIAGNOSIS — Z029 Encounter for administrative examinations, unspecified: Secondary | ICD-10-CM

## 2017-06-24 ENCOUNTER — Other Ambulatory Visit: Payer: Self-pay | Admitting: Nurse Practitioner

## 2017-06-24 ENCOUNTER — Telehealth: Payer: Self-pay | Admitting: Family Medicine

## 2017-06-24 NOTE — Telephone Encounter (Signed)
Patient is requesting a refill for Phentermine 37.5 mg.  She said she had 2 refills left because she hasn't been taking it, but her Rx at the pharmacy has expired.  [facility]

## 2017-06-24 NOTE — Telephone Encounter (Signed)
Discussed with pt. Pt states she will call back to schedule office visit.  

## 2017-06-24 NOTE — Telephone Encounter (Signed)
Because of the length of time, she will need an office visit before further refills. (over 6 months ago)

## 2017-06-24 NOTE — Telephone Encounter (Signed)
Last prescribed 1010/2018

## 2017-06-29 ENCOUNTER — Encounter: Payer: Self-pay | Admitting: Family Medicine

## 2017-07-06 ENCOUNTER — Encounter: Payer: Self-pay | Admitting: Family Medicine

## 2017-07-06 ENCOUNTER — Ambulatory Visit (INDEPENDENT_AMBULATORY_CARE_PROVIDER_SITE_OTHER): Payer: Managed Care, Other (non HMO) | Admitting: Family Medicine

## 2017-07-06 VITALS — BP 122/90 | Temp 98.4°F | Ht 68.0 in | Wt 296.4 lb

## 2017-07-06 DIAGNOSIS — R21 Rash and other nonspecific skin eruption: Secondary | ICD-10-CM

## 2017-07-06 DIAGNOSIS — L049 Acute lymphadenitis, unspecified: Secondary | ICD-10-CM

## 2017-07-06 NOTE — Progress Notes (Signed)
   Subjective:    Patient ID: [name], female    DOB: [birth year], 42 y.o.   MRN: [id number]  Neck Pain   This is a new problem. The current episode started in the past 7 days. The pain is present in the right side. The quality of the pain is described as burning. Associated symptoms include headaches. Associated symptoms comments: Ear pain. Treatments tried: ibuprofen.   Patient experienced sunburn to  shoulder.  This was several days ago.  Now lateral neck pain.  Sharp in nature.  Feels like she can feel her neck.  No sore throat.  No fever   Review of Systems  Musculoskeletal: Positive for neck pain.  Neurological: Positive for headaches.       Objective:   Physical Exam Alert vitals stable, NAD. Blood pressure good on repeat. HEENT normal. Lungs clear. Heart regular rate and rhythm. Impression sunburn right shoulder positive tender lymph node lateral cervical mid region       Assessment & Plan:  Impression probable reactive lymph node secondary to inflammation from sunburn discussed expect resolution in coming days.  No need for antibiotics rationale discussed  Greater than 50% of this 15 minute face to face visit was spent in counseling and discussion and coordination of care regarding the above diagnosis/diagnosies

## 2017-07-12 ENCOUNTER — Encounter: Payer: Self-pay | Admitting: Family Medicine

## 2017-07-16 ENCOUNTER — Encounter: Payer: Self-pay | Admitting: Nurse Practitioner

## 2017-07-16 ENCOUNTER — Ambulatory Visit (INDEPENDENT_AMBULATORY_CARE_PROVIDER_SITE_OTHER): Payer: Managed Care, Other (non HMO) | Admitting: Nurse Practitioner

## 2017-07-16 VITALS — BP 124/86 | Ht 68.0 in | Wt 292.0 lb

## 2017-07-16 DIAGNOSIS — K219 Gastro-esophageal reflux disease without esophagitis: Secondary | ICD-10-CM | POA: Diagnosis not present

## 2017-07-16 MED ORDER — PHENTERMINE HCL 37.5 MG PO TABS: 37.5000 mg | ORAL_TABLET | Freq: Every day | ORAL | 2 refills | Status: DC

## 2017-07-17 ENCOUNTER — Encounter: Payer: Self-pay | Admitting: Nurse Practitioner

## 2017-07-17 NOTE — Progress Notes (Signed)
Subjective: Presents for routine follow-up.  Reflux symptoms are now under good control.  Would like to focus on her weight gain.  Only filled her phentermine one time back in October, states she was not ready mentally to focus on her weight loss.  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  Has started walking on a regular basis.  Has cut down her regular soda to 1/day from 3/day.  Trying to eat healthier.  Denies any adverse effects from her phentermine.   Objective:   BP 124/86   Ht  (1.727 m)   Wt 292 lb (132.5 kg)   BMI 44.40 kg/m  NAD.  Alert, oriented.  Lungs clear.  Heart regular rate rhythm.  Abdomen soft nontender.  Assessment:   Problem List Items Addressed This Visit      Digestive   GERD - Primary     Other   Morbid obesity (HCC)   Relevant Medications   phentermine (ADIPEX-P) 37.5 MG tablet      Plan:   Meds ordered this encounter  Medications  . phentermine (ADIPEX-P) 37.5 MG tablet    Sig: Take 1 tablet (37.5 mg total) by mouth daily before breakfast.    Dispense:  30 tablet    Refill:  2    Order Specific Question:   Supervising Provider    Answer:   Merlyn Albert [[id number]]   Encouraged healthy diet including reduced sugar and simple carbs.  Recommend weight loss program such as weight watchers.  Restart phentermine as directed.  DC med and call if any adverse effects.  Recheck in 3 months if she wishes to continue medication.

## 2017-08-13 ENCOUNTER — Encounter: Payer: Self-pay | Admitting: Family Medicine

## 2017-10-18 ENCOUNTER — Ambulatory Visit: Payer: Managed Care, Other (non HMO) | Admitting: Nurse Practitioner

## 2017-11-01 ENCOUNTER — Other Ambulatory Visit: Payer: Self-pay

## 2017-11-04 ENCOUNTER — Telehealth: Payer: Self-pay

## 2017-12-09 DIAGNOSIS — Z1501 Genetic susceptibility to malignant neoplasm of breast: Secondary | ICD-10-CM | POA: Insufficient documentation

## 2017-12-15 ENCOUNTER — Other Ambulatory Visit: Payer: Self-pay | Admitting: Obstetrics and Gynecology

## 2017-12-15 DIAGNOSIS — Z1231 Encounter for screening mammogram for malignant neoplasm of breast: Secondary | ICD-10-CM

## 2017-12-23 ENCOUNTER — Ambulatory Visit
Admission: RE | Admit: 2017-12-23 | Discharge: 2017-12-23 | Disposition: A | Discharge status: Home / Self Care | Payer: Managed Care, Other (non HMO) | Source: Ambulatory Visit | Attending: Obstetrics and Gynecology | Admitting: Obstetrics and Gynecology

## 2017-12-23 DIAGNOSIS — Z1231 Encounter for screening mammogram for malignant neoplasm of breast: Secondary | ICD-10-CM

## 2017-12-31 ENCOUNTER — Other Ambulatory Visit: Payer: Self-pay | Admitting: Obstetrics and Gynecology

## 2017-12-31 DIAGNOSIS — Z803 Family history of malignant neoplasm of breast: Secondary | ICD-10-CM

## 2018-01-24 DIAGNOSIS — Z6841 Body Mass Index (BMI) 40.0 and over, adult: Secondary | ICD-10-CM | POA: Insufficient documentation

## 2018-01-24 DIAGNOSIS — F410 Panic disorder [episodic paroxysmal anxiety] without agoraphobia: Secondary | ICD-10-CM | POA: Insufficient documentation

## 2018-01-24 DIAGNOSIS — J309 Allergic rhinitis, unspecified: Secondary | ICD-10-CM | POA: Insufficient documentation

## 2018-01-25 DIAGNOSIS — R7303 Prediabetes: Secondary | ICD-10-CM | POA: Insufficient documentation

## 2018-02-27 ENCOUNTER — Other Ambulatory Visit: Payer: Managed Care, Other (non HMO)

## 2018-04-15 ENCOUNTER — Other Ambulatory Visit: Payer: Managed Care, Other (non HMO)

## 2019-01-02 ENCOUNTER — Other Ambulatory Visit: Payer: Self-pay

## 2019-01-02 ENCOUNTER — Ambulatory Visit
Admission: EM | Admit: 2019-01-02 | Discharge: 2019-01-02 | Disposition: A | Discharge status: Home / Self Care | Payer: Managed Care, Other (non HMO)

## 2019-01-02 DIAGNOSIS — N390 Urinary tract infection, site not specified: Secondary | ICD-10-CM

## 2019-01-02 LAB — URINALYSIS, COMPLETE (UACMP) WITH MICROSCOPIC
Bilirubin Urine: NEGATIVE
Glucose, UA: 100 mg/dL — AB
Hgb urine dipstick: NEGATIVE
Ketones, ur: NEGATIVE mg/dL
Leukocytes,Ua: NEGATIVE
Nitrite: POSITIVE — AB
Protein, ur: NEGATIVE mg/dL
Specific Gravity, Urine: 1.02 (ref 1.005–1.030)
pH: 7 (ref 5.0–8.0)

## 2019-01-02 MED ORDER — CEPHALEXIN 500 MG PO CAPS: 500.0000 mg | ORAL_CAPSULE | Freq: Two times a day (BID) | ORAL | 0 refills | Status: AC

## 2019-01-02 NOTE — ED Provider Notes (Addendum)
[facility] ____________________________________________  Time seen: Approximately 11:03 AM  I have reviewed the triage vital signs and the nursing notes.   HISTORY  Chief Complaint Urinary Frequency   HPI [name] is a 43 y.o. female presenting for evaluation of urinary frequency, urinary urgency and burning with urination.  Patient reports just over a week ago she was seen by OB/GYN and diagnosed with a urinary tract infection was treated with 7-day course of Macrobid.  Reports around 5 days into the antibiotic she began having return of symptoms.  Concern for continued UTI.  Denies atypical abdominal pain or back pain.  No flank pain or hematuria.  Has been taken over-the-counter Azo which helps some.  States this feels like a recurrent UTI for her.  Reports sexual intercourse was the suspected trigger initially for this.  Denies pregnancy.  Denies other aggravating alleviating factors.  No recent cough, fevers or sickness.  Has continued to eat and drink well.  Mikey Kirschner, MD: PCP    Past Medical History:  Diagnosis Date  . Abdominal pain   . Anxiety    in past, records note bipolar disorder; however, states this was misdiagnosed  . Dizziness   . Generalized headaches   . GERD (gastroesophageal reflux disease)   . Kidney stones   . Nausea & vomiting   . PONV (postoperative nausea and vomiting)   . Rectal bleeding   . Sinus problem   . Syncope     Patient Active Problem List   Diagnosis Date Noted  . Morbid obesity ([place]) 07/17/2017  . Abdominal pain 03/28/2012  . Epigastric pain 02/25/2012  . Abdominal pain, LLQ 02/16/2011  . Nausea & vomiting 02/16/2011  . Rectal bleed 02/16/2011  . ALT (SGPT) level raised 02/16/2011  . Chills   . ABDOMINAL PAIN, LEFT UPPER QUADRANT 10/10/2007  . BIPOLAR DISORDER UNSPECIFIED 10/07/2007  . GERD 10/07/2007  . DIARRHEA, CHRONIC 10/07/2007    Past Surgical History:  Procedure Laterality Date  .  APPENDECTOMY    . CHOLECYSTECTOMY  12/2010  . DILATION AND CURETTAGE OF UTERUS    . ESOPHAGOGASTRODUODENOSCOPY  03/15/2012   RMR: Small hiatal hernia. Small solitary duodenal diverticulum-status post biopsy of duodenum PATH: duodenal mucosa with intramucosal lymphoctyosis , without evidence of prominent villous blunting. Concern for partially developed sprue, severe obesity, bacteria overgrowth, H. pylori, clinical and serologic correlation recommended.      No current facility-administered medications for this encounter.   Current Outpatient Medications:  .  escitalopram (LEXAPRO) 10 MG tablet, Take 20 mg by mouth daily. , Disp: , Rfl:  .  fluticasone (FLONASE) 50 MCG/ACT nasal spray, Place 2 sprays into both nostrils daily., Disp: 16 g, Rfl: 5 .  LORazepam (ATIVAN) 0.5 MG tablet, Take 0.5 mg by mouth daily as needed. For anxiety, Disp: , Rfl:  .  pantoprazole (PROTONIX) 40 MG tablet, TAKE 1 TABLET BY MOUTH DAILY FOR ACID REFLUX, Disp: 30 tablet, Rfl: 2 .  phenazopyridine (PYRIDIUM) 97 MG tablet, Take 97 mg by mouth 3 (three) times daily as needed for pain., Disp: , Rfl:  .  cephALEXin (KEFLEX) 500 MG capsule, Take 1 capsule (500 mg total) by mouth 2 (two) times daily for 10 days., Disp: 20 capsule, Rfl: 0 .  phentermine (ADIPEX-P) 37.5 MG tablet, Take 1 tablet (37.5 mg total) by mouth daily before breakfast., Disp: 30 tablet, Rfl: 2  Allergies Percocet [oxycodone-acetaminophen] and Metoclopramide hcl  Family History  Problem Relation Age of Onset  .  Hypertension Mother   . Hypertension Father   . Ulcerative colitis Maternal Aunt        UC, age 527. Colostomy for severe UC. Liver transplant mid-20s (some sort of complication/Staph infection. 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   . Irritable bowel syndrome Sister   . Breast cancer Maternal Grandfather   . Colon cancer Neg Hx     Social History Social History   Tobacco Use  . Smoking  status: Never Smoker  . Smokeless tobacco: Never Used  Substance Use Topics  . Alcohol use: No  . Drug use: No    Review of Systems Constitutional: No fever ENT: No sore throat. Cardiovascular: Denies chest pain. Respiratory: Denies shortness of breath. Gastrointestinal: No abdominal pain.  No nausea, no vomiting.  No diarrhea. Genitourinary: Positive for dysuria. Musculoskeletal: Negative for back pain. Skin: Negative for rash. ____________________________________________   PHYSICAL EXAM:  VITAL SIGNS: ED Triage Vitals  Enc Vitals Group     BP 01/02/19 1041 122/86     Pulse Rate 01/02/19 1041 99     Resp 01/02/19 1041 18     Temp 01/02/19 1041 97.8 F (36.6 C)     Temp Source 01/02/19 1041 Oral     SpO2 01/02/19 1041 96 %     Weight 01/02/19 1042 258 lb (117 kg)     Height 01/02/19 1042 5\' 7"  (1.702 m)     Head Circumference --      Peak Flow --      Pain Score 01/02/19 1042 0     Pain Loc --      Pain Edu? --      Excl. in GC? --     Constitutional: Alert and oriented. Well appearing and in no acute distress. Eyes: Conjunctivae are normal.  ENT      Head: Normocephalic and atraumatic. Cardiovascular: Normal rate, regular rhythm. Grossly normal heart sounds.  Good peripheral circulation. Respiratory: Normal respiratory effort without tachypnea nor retractions. Breath sounds are clear and equal bilaterally. No wheezes, rales, rhonchi. Gastrointestinal: Soft and nontender. No CVA tenderness. Musculoskeletal:  Steady gait. No lumbar tenderness.  Neurologic:  Normal speech and language.  Skin:  Skin is warm, dry and intact. No rash noted. Psychiatric: Mood and affect are normal. Speech and behavior are normal. Patient exhibits appropriate insight and judgment   ___________________________________________   LABS (all labs ordered are listed, but only abnormal results are displayed)  Labs Reviewed  URINALYSIS, COMPLETE (UACMP) WITH MICROSCOPIC - Abnormal; Notable  for the following components:      Result Value   Color, Urine ORANGE (*)    APPearance HAZY (*)    Glucose, UA 100 (*)    Nitrite POSITIVE (*)    Bacteria, UA MANY (*)    All other components within normal limits  URINE CULTURE     PROCEDURES Procedures    INITIAL IMPRESSION / ASSESSMENT AND PLAN / ED COURSE  Pertinent labs & imaging results that were available during my care of the patient were reviewed by me and considered in my medical decision making (see chart for details).  Well appearing patient. No acute distress. Dysuria complaints. Urinalysis reviewed, uti.  We will culture urine, await urine culture and empirically treat with oral Keflex 10-day course.  Encourage rest, fluids, supportive care.  Encourage follow-up.Discussed indication, risks and benefits of medications with patient.  Discussed follow up with Primary care physician or OBGYN. Discussed follow up and return parameters including  no resolution or any worsening concerns. Patient verbalized understanding and agreed to plan.   ____________________________________________   FINAL CLINICAL IMPRESSION(S) / ED DIAGNOSES  Final diagnoses:  Urinary tract infection without hematuria, site unspecified     ED Discharge Orders         Ordered    cephALEXin (KEFLEX) 500 MG capsule  2 times daily     01/02/19 1128           Note: This dictation was prepared with Dragon dictation along with smaller phrase technology. Any transcriptional errors that result from this process are unintentional.         Renford Dills, NP 01/02/19 1139

## 2019-01-02 NOTE — Discharge Instructions (Addendum)
Take medication as prescribed. Rest. Drink plenty of fluids.   Follow up with your primary care physician or OBGYN this week as needed. Return to Urgent care for new or worsening concerns.

## 2019-01-02 NOTE — ED Triage Notes (Signed)
Pt was dx with UTI by her gynecologist and prescribed Macrobid-finished entire course. Pt reports that she began to experience frequency and burning towards end of course of ABX. Pt alert and oriented X4, cooperative, RR even and unlabored, color WNL. Pt in NAD.

## 2019-01-04 LAB — URINE CULTURE: Culture: >=100000 — AB

## 2019-01-05 ENCOUNTER — Telehealth (HOSPITAL_COMMUNITY): Payer: Self-pay | Admitting: Emergency Medicine

## 2019-01-05 NOTE — Telephone Encounter (Signed)
Urine culture was positive for e coli and was given keflex at urgent care visit. Pt contacted and made aware, educated on completing antibiotic and to follow up if symptoms are persistent. Verbalized understanding.    

## 2019-01-25 ENCOUNTER — Other Ambulatory Visit: Payer: Self-pay

## 2019-01-25 ENCOUNTER — Ambulatory Visit
Admission: RE | Admit: 2019-01-25 | Discharge: 2019-01-25 | Disposition: A | Discharge status: Home / Self Care | Payer: Managed Care, Other (non HMO) | Source: Ambulatory Visit | Attending: Obstetrics and Gynecology | Admitting: Obstetrics and Gynecology

## 2019-01-25 ENCOUNTER — Other Ambulatory Visit: Payer: Self-pay | Admitting: Obstetrics and Gynecology

## 2019-01-25 DIAGNOSIS — Z1231 Encounter for screening mammogram for malignant neoplasm of breast: Secondary | ICD-10-CM

## 2019-01-25 DIAGNOSIS — Z20822 Contact with and (suspected) exposure to covid-19: Secondary | ICD-10-CM

## 2019-01-27 ENCOUNTER — Other Ambulatory Visit: Payer: Self-pay | Admitting: Obstetrics and Gynecology

## 2019-01-27 DIAGNOSIS — R928 Other abnormal and inconclusive findings on diagnostic imaging of breast: Secondary | ICD-10-CM

## 2019-01-27 LAB — SPECIMEN STATUS REPORT

## 2019-01-27 LAB — NOVEL CORONAVIRUS, NAA: SARS-CoV-2, NAA: NOT DETECTED

## 2019-01-31 ENCOUNTER — Other Ambulatory Visit: Payer: Self-pay

## 2019-01-31 ENCOUNTER — Other Ambulatory Visit: Payer: Self-pay | Admitting: Obstetrics and Gynecology

## 2019-01-31 ENCOUNTER — Ambulatory Visit
Admission: RE | Admit: 2019-01-31 | Discharge: 2019-01-31 | Disposition: A | Discharge status: Home / Self Care | Payer: Managed Care, Other (non HMO) | Source: Ambulatory Visit | Attending: Obstetrics and Gynecology | Admitting: Obstetrics and Gynecology

## 2019-01-31 DIAGNOSIS — R928 Other abnormal and inconclusive findings on diagnostic imaging of breast: Secondary | ICD-10-CM

## 2019-02-01 ENCOUNTER — Inpatient Hospital Stay: Admission: RE | Admit: 2019-02-01 | Payer: Managed Care, Other (non HMO) | Source: Ambulatory Visit

## 2019-04-10 ENCOUNTER — Other Ambulatory Visit: Payer: Self-pay | Admitting: Obstetrics and Gynecology

## 2019-05-08 ENCOUNTER — Other Ambulatory Visit: Payer: Managed Care, Other (non HMO)

## 2019-05-29 ENCOUNTER — Other Ambulatory Visit: Payer: Managed Care, Other (non HMO)

## 2019-06-23 ENCOUNTER — Other Ambulatory Visit: Payer: Managed Care, Other (non HMO)

## 2019-07-19 ENCOUNTER — Encounter: Payer: Managed Care, Other (non HMO) | Admitting: Podiatry

## 2019-07-19 ENCOUNTER — Ambulatory Visit: Payer: Managed Care, Other (non HMO)

## 2019-07-31 NOTE — Progress Notes (Signed)
This encounter was created in error - please disregard.

## 2019-08-01 ENCOUNTER — Encounter: Payer: Self-pay | Admitting: Podiatry

## 2019-08-01 ENCOUNTER — Ambulatory Visit: Payer: Managed Care, Other (non HMO) | Admitting: Podiatry

## 2019-08-01 ENCOUNTER — Other Ambulatory Visit: Payer: Self-pay

## 2019-08-01 ENCOUNTER — Ambulatory Visit (INDEPENDENT_AMBULATORY_CARE_PROVIDER_SITE_OTHER): Payer: Managed Care, Other (non HMO)

## 2019-08-01 DIAGNOSIS — M79672 Pain in left foot: Secondary | ICD-10-CM

## 2019-08-01 DIAGNOSIS — M7752 Other enthesopathy of left foot: Secondary | ICD-10-CM

## 2019-08-01 DIAGNOSIS — M722 Plantar fascial fibromatosis: Secondary | ICD-10-CM

## 2019-08-01 DIAGNOSIS — M779 Enthesopathy, unspecified: Secondary | ICD-10-CM

## 2019-08-01 MED ORDER — METHYLPREDNISOLONE 4 MG PO TBPK: ORAL_TABLET | ORAL | 0 refills | Status: DC

## 2019-08-01 NOTE — Patient Instructions (Signed)

## 2019-08-01 NOTE — Progress Notes (Signed)
Subjective:   Patient ID: [name], female   DOB: 44 y.o.   MRN: [id number]   HPI 44 year old female presents the office today for concerns of left heel pain which is longer the last 1 month.  She gets pain to the bottom of the heel going to the arch of the foot.  She has purchased a night splint which helps some she is been doing exercises.  She states that she feels that there is only pulling in her foot.  She does try to roll her foot with a frozen water bottle.  She was also tried on her foot with a tennis ball.  No recent injury.  No other concerns.   Review of Systems  All other systems reviewed and are negative.  Past Medical History:  Diagnosis Date  . Abdominal pain   . Anxiety    in past, records note bipolar disorder; however, states this was misdiagnosed  . Dizziness   . Generalized headaches   . GERD (gastroesophageal reflux disease)   . Kidney stones   . Nausea & vomiting   . PONV (postoperative nausea and vomiting)   . Rectal bleeding   . Sinus problem   . Syncope     Past Surgical History:  Procedure Laterality Date  . APPENDECTOMY    . CHOLECYSTECTOMY  12/2010  . DILATION AND CURETTAGE OF UTERUS    . ESOPHAGOGASTRODUODENOSCOPY  03/15/2012   RMR: Small hiatal hernia. Small solitary duodenal diverticulum-status post biopsy of duodenum PATH: duodenal mucosa with intramucosal lymphoctyosis , without evidence of prominent villous blunting. Concern for partially developed sprue, severe obesity, bacteria overgrowth, H. pylori, clinical and serologic correlation recommended.      Current Outpatient Medications:  .  escitalopram (LEXAPRO) 10 MG tablet, Take 20 mg by mouth daily. , Disp: , Rfl:  .  fluticasone (FLONASE) 50 MCG/ACT nasal spray, Place 2 sprays into both nostrils daily., Disp: 16 g, Rfl: 5 .  LORazepam (ATIVAN) 0.5 MG tablet, Take 0.5 mg by mouth daily as needed. For anxiety, Disp: , Rfl:  .  methylPREDNISolone (MEDROL DOSEPAK) 4 MG TBPK tablet, Take  as directed, Disp: 21 tablet, Rfl: 0 .  phentermine (ADIPEX-P) 37.5 MG tablet, Take 1 tablet (37.5 mg total) by mouth daily before breakfast., Disp: 30 tablet, Rfl: 2  Allergies  Allergen Reactions  . Percocet [Oxycodone-Acetaminophen] Nausea And Vomiting    Only in high doses  . Metoclopramide Hcl Palpitations    tachycardia         Objective:  Physical Exam  General: AAO x3, NAD  Dermatological: Skin is warm, dry and supple bilateral. Nails x 10 are well manicured; remaining integument appears unremarkable at this time. There are no open sores, no preulcerative lesions, no rash or signs of infection present.  Vascular: Dorsalis Pedis artery and Posterior Tibial artery pedal pulses are 2/4 bilateral with immedate capillary fill time. Pedal hair growth present. No varicosities and no lower extremity edema present bilateral. There is no pain with calf compression, swelling, warmth, erythema.   Neruologic: Grossly intact via light touch bilateral. Vibratory intact via tuning fork bilateral. Protective threshold with Semmes Wienstein monofilament intact to all pedal sites bilateral. Negative tinel sign.   Musculoskeletal: Tenderness to palpation along the plantar medial tubercle of the calcaneus at the insertion of plantar fascia on the left foot. There is mild pain along the course of the plantar fascia within the arch of the foot. Plantar fascia appears to be intact. There is no  pain with lateral compression of the calcaneus or pain with vibratory sensation. There is no pain along the course or insertion of the achilles tendon. No other areas of tenderness to bilateral lower extremities. Muscular strength 5/5 in all groups tested bilateral.  Gait: Unassisted, Nonantalgic.       Assessment:   Left heel pain, plantar fasciitis    Plan:  -Treatment options were discussed with the patient including alternatives, risks, complications. -X-rays obtained reviewed.  No evidence of acute  fracture.  No significant inferior calcaneal spur present. -Steroid injection performed.  See procedure note below.  Dispensed plantar fascial brace.  Prescribed Medrol Dosepak.  Continue stretching, icing daily.  Discussed shoe modifications and orthotics.  We will check orthotic benefit coverage for her. -Follow-up as directed. In the meantime, encouraged to call the office with any questions, concerns, change in symptoms.  Procedure: Injection Tendon/Ligament Discussed alternatives, risks, complications and verbal consent was obtained.  Location: LEFT plantar fascia at the glabrous junction; medial approach. Skin Prep: Alcohol  Injectate: 0.5cc 0.5% marcaine plain, 0.5 cc 2% lidocaine plain and, 1 cc kenalog 10. Disposition: Patient tolerated procedure well. Injection site dressed with a band-aid.  Post-injection care was discussed and return precautions discussed.   No follow-ups on file.  Trula Slade DPM

## 2019-08-11 ENCOUNTER — Other Ambulatory Visit: Payer: Self-pay | Admitting: *Deleted

## 2019-08-11 MED ORDER — MELOXICAM 15 MG PO TABS: 15.0000 mg | ORAL_TABLET | Freq: Every day | ORAL | 0 refills | Status: DC

## 2019-08-11 NOTE — Addendum Note (Signed)
Addended by: Hadley Pen R on: 08/11/2019 04:41 PM   Modules accepted: Orders

## 2019-08-11 NOTE — Telephone Encounter (Signed)
Called patient and relayed message per Dr Ardelle Anton.Misty Stanley

## 2019-08-14 ENCOUNTER — Ambulatory Visit
Admission: RE | Admit: 2019-08-14 | Discharge: 2019-08-14 | Disposition: A | Discharge status: Home / Self Care | Payer: Managed Care, Other (non HMO) | Source: Ambulatory Visit | Attending: Obstetrics and Gynecology | Admitting: Obstetrics and Gynecology

## 2019-08-14 ENCOUNTER — Other Ambulatory Visit: Payer: Self-pay

## 2019-08-14 ENCOUNTER — Other Ambulatory Visit: Payer: Self-pay | Admitting: Obstetrics and Gynecology

## 2019-08-14 DIAGNOSIS — Z1501 Genetic susceptibility to malignant neoplasm of breast: Secondary | ICD-10-CM

## 2019-08-14 DIAGNOSIS — R928 Other abnormal and inconclusive findings on diagnostic imaging of breast: Secondary | ICD-10-CM

## 2019-08-16 ENCOUNTER — Telehealth: Payer: Self-pay | Admitting: *Deleted

## 2019-08-16 NOTE — Telephone Encounter (Signed)
Called into the pharmacy the [facility] Friday 08-11-2019. Misty Stanley

## 2019-08-16 NOTE — Telephone Encounter (Signed)
-----   Message from Vivi Barrack, DPM sent at 08/11/2019  4:19 PM EDT ----- Yes, can you please call in Mobic 15mg  1 tablet daily disp #30 tablets no refills. She cannot take ibuprofen while on mobic.  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 Can we call in mobic for her? Please advise. Vivi Barrack

## 2019-08-17 ENCOUNTER — Other Ambulatory Visit: Payer: Managed Care, Other (non HMO) | Admitting: Orthotics

## 2019-08-29 ENCOUNTER — Other Ambulatory Visit: Payer: Self-pay

## 2019-08-29 ENCOUNTER — Ambulatory Visit (INDEPENDENT_AMBULATORY_CARE_PROVIDER_SITE_OTHER): Payer: Managed Care, Other (non HMO) | Admitting: Orthotics

## 2019-08-29 DIAGNOSIS — M216X1 Other acquired deformities of right foot: Secondary | ICD-10-CM | POA: Diagnosis not present

## 2019-08-29 DIAGNOSIS — M216X2 Other acquired deformities of left foot: Secondary | ICD-10-CM | POA: Diagnosis not present

## 2019-08-29 DIAGNOSIS — M779 Enthesopathy, unspecified: Secondary | ICD-10-CM

## 2019-08-29 DIAGNOSIS — M79672 Pain in left foot: Secondary | ICD-10-CM

## 2019-08-29 NOTE — Progress Notes (Signed)

## 2019-08-30 ENCOUNTER — Telehealth: Payer: Self-pay | Admitting: Podiatry

## 2019-08-30 NOTE — Telephone Encounter (Signed)
Pt called and wanted to talk about her injection for P/F pain pt has appt on 09/05/19

## 2019-08-31 ENCOUNTER — Other Ambulatory Visit: Payer: Self-pay | Admitting: Podiatry

## 2019-08-31 MED ORDER — CELECOXIB 200 MG PO CAPS: 200.0000 mg | ORAL_CAPSULE | Freq: Every day | ORAL | 0 refills | Status: DC

## 2019-08-31 NOTE — Telephone Encounter (Signed)
Patient called and stated that the mobic is hurting her stomach and the injections are not helping and would like to see if there is something else she can try and I can call patient for you. [name]

## 2019-08-31 NOTE — Telephone Encounter (Signed)
I called patient back. Mobic upset her stomach. I sent celebrex to the pharmacy.   The pain has moved and is now more in the bottom of the heel and not the arch. Offered for her to come in for steroid injection. She lives in [place] and I am in the [place] office. She will follow up with Dr. Logan Bores tomorrow for this.

## 2019-09-01 ENCOUNTER — Ambulatory Visit: Payer: Managed Care, Other (non HMO) | Admitting: Podiatry

## 2019-09-05 ENCOUNTER — Ambulatory Visit: Payer: Managed Care, Other (non HMO) | Admitting: Podiatry

## 2019-09-20 ENCOUNTER — Other Ambulatory Visit: Payer: Self-pay

## 2019-09-20 ENCOUNTER — Ambulatory Visit (INDEPENDENT_AMBULATORY_CARE_PROVIDER_SITE_OTHER): Payer: Managed Care, Other (non HMO) | Admitting: Podiatry

## 2019-09-20 ENCOUNTER — Encounter: Payer: Self-pay | Admitting: Podiatry

## 2019-09-20 DIAGNOSIS — M79672 Pain in left foot: Secondary | ICD-10-CM

## 2019-09-20 DIAGNOSIS — G8929 Other chronic pain: Secondary | ICD-10-CM

## 2019-09-20 DIAGNOSIS — M722 Plantar fascial fibromatosis: Secondary | ICD-10-CM

## 2019-09-20 MED ORDER — PREDNISONE 5 MG PO TABS: ORAL_TABLET | ORAL | 0 refills | Status: AC

## 2019-09-20 NOTE — Progress Notes (Signed)
  Subjective:  Patient ID: [name], female    DOB: 17-Sep-1975,  MRN: [id number]  Chief Complaint  Patient presents with  . Plantar Fasciitis    Pt states left plantar heel is painful, requests injection.    44 y.o. female presents with the above complaint. History confirmed with patient.  Normally she sees Dr. Loreta Ave for this.  She has had 1 injection before which was helpful.  She was not able to tolerate meloxicam due to GI side effects.  She had orthotic scan and is here today to pick these up.  She requests an injection in the heel as well.  Objective:  Physical Exam: warm, good capillary refill, no trophic changes or ulcerative lesions, normal DP and PT pulses and normal sensory exam. Left Foot: point tenderness over the heel pad   Assessment:   1. Chronic heel pain, left   2. Plantar fasciitis      Plan:  Patient was evaluated and treated and all questions answered.  Discussed the etiology and treatment options for plantar fasciitis including stretching, formal physical therapy, supportive shoegears such as a running shoe or sneaker, pre fabricated orthoses, injection therapy, and oral medications. We also discussed the role of surgical treatment of this for patients who do not improve after exhausting non-surgical treatment options.  Custom molded orthoses were dispensed today and checked for form and fit.  She felt that they were comfortable.  We discussed the break-in.,  She will be using these once she has shoes that will fit in with new Brooks running shoes.  Prescription for Medrol Dosepak sent to pharmacy for further anti-inflammatory relief.  Side effects and benefits of the medication were discussed.  After sterile prep with povidone-iodine solution and alcohol, the left heel was injected with 0.5cc 2% xylocaine plain, 0.5cc 0.5% marcaine plain, 5mg  triamcinolone acetonide, and 2mg  dexamethasone was injected along medially the plantar fascia at the insertion on  the plantar calcaneus. The patient tolerated the procedure well without complication.  , DPM 09/20/2019     Return in about 6 weeks (around 11/01/2019) for recheck plantar fasciitis with Dr 09/22/2019.

## 2019-09-27 ENCOUNTER — Encounter: Payer: Managed Care, Other (non HMO) | Admitting: Orthotics

## 2019-11-01 ENCOUNTER — Ambulatory Visit: Payer: Managed Care, Other (non HMO) | Admitting: Podiatry

## 2019-11-01 ENCOUNTER — Encounter: Payer: Self-pay | Admitting: Podiatry

## 2019-11-01 ENCOUNTER — Other Ambulatory Visit: Payer: Self-pay

## 2019-11-01 DIAGNOSIS — M722 Plantar fascial fibromatosis: Secondary | ICD-10-CM

## 2019-11-01 NOTE — Progress Notes (Signed)
  Subjective:  Patient ID: [name], female    DOB: 01-03-1976,  MRN: [id number]  Chief Complaint  Patient presents with  . Plantar Fasciitis    i have some heel pain and the pain has moved and is mainly on the heel     44 y.o. female presents with the above complaint. History confirmed with patient.  Orthotics have been quite helpful. The injection last visit fixed the medial pain, she now notices more pain laterally.   Objective:  Physical Exam: warm, good capillary refill, no trophic changes or ulcerative lesions, normal DP and PT pulses and normal sensory exam. Left Foot: point tenderness over the heel pad, all lateral now at the lateral band insertion  Assessment:   1. Plantar fasciitis      Plan:  Patient was evaluated and treated and all questions answered.  Discussed the etiology and treatment options for plantar fasciitis including stretching, formal physical therapy, supportive shoegears such as a running shoe or sneaker, pre fabricated orthoses, injection therapy, and oral medications. We also discussed the role of surgical treatment of this for patients who do not improve after exhausting non-surgical treatment options.  Continue orthotics.  After sterile prep with povidone-iodine solution and alcohol, the left heel was injected with 0.5cc 2% xylocaine plain, 0.5cc 0.5% marcaine plain, 5mg  triamcinolone acetonide, and 2mg  dexamethasone was injected now this time laterally along the plantar fascia at the insertion on the plantar calcaneus. The patient tolerated the procedure well without complication.  At next visit, it will be ~6 months since she is dealing with this. If it is no better we will consider MRI/boot/PT.  , DPM 11/01/2019     Return in about 4 weeks (around 11/29/2019) for recheck plantar fasciitis.

## 2019-12-12 ENCOUNTER — Ambulatory Visit: Payer: Managed Care, Other (non HMO) | Admitting: Podiatry

## 2019-12-19 ENCOUNTER — Ambulatory Visit: Payer: Managed Care, Other (non HMO) | Admitting: Podiatry

## 2020-01-08 ENCOUNTER — Ambulatory Visit (INDEPENDENT_AMBULATORY_CARE_PROVIDER_SITE_OTHER): Payer: Managed Care, Other (non HMO) | Admitting: Podiatry

## 2020-01-08 ENCOUNTER — Encounter: Payer: Self-pay | Admitting: Podiatry

## 2020-01-08 ENCOUNTER — Other Ambulatory Visit: Payer: Self-pay

## 2020-01-08 DIAGNOSIS — M722 Plantar fascial fibromatosis: Secondary | ICD-10-CM

## 2020-01-08 DIAGNOSIS — M79672 Pain in left foot: Secondary | ICD-10-CM

## 2020-01-08 NOTE — Progress Notes (Addendum)
  Subjective:  Patient ID: [name], female    DOB: 11/25/1975,  MRN: [id number]  Chief Complaint  Patient presents with  . Plantar Fasciitis    "it hurts in the bottom of my heel"  She request another injection    44 y.o. female returns with the above complaint. History confirmed with patient.  The injection at last visit helped for a few weeks but her pain has returned now more medial and central.  She is very frustrated with the progression and only notes 50% improvement since beginning of her treatment   Objective:  Physical Exam: warm, good capillary refill, no trophic changes or ulcerative lesions, normal DP and PT pulses and normal sensory exam. Left Foot: point tenderness over the heel pad, medial and central plantar heel   X-rays dated 08/01/19: mild pes cavus foot type, no plantar calcaneal spur is noted, no fracture dislocation or degenerative changes noted Assessment:   1. Plantar fasciitis of left foot   2. Left foot pain      Plan:  Patient was evaluated and treated and all questions answered.   -Heel was injected as per below -At this point she has exhausted conservative therapy at this point.  I recommended that we increase her immobilization into a short CAM boot and she may be WBAT in this.  Also recommend we obtain an MRI to evaluate for thickening, edema in the calcaneus as well as possibility of partial tears of the plantar fascia.  This was sent to [facility] imaging.  Also discussed further treatment beyond this including extracorporeal shockwave therapy versus endoscopic plantar fasciotomy.  We discussed pros and cons of each.  She will consider this and we will discuss further at her next visit after review of the MRI.  After sterile prep with povidone-iodine solution and alcohol, the left heel was injected with 0.5cc 2% xylocaine plain, 0.5cc 0.5% marcaine plain, 5mg  triamcinolone acetonide, and 2mg  dexamethasone was injected now this time laterally  along the plantar fascia at the insertion on the plantar calcaneus. The patient tolerated the procedure well without complication.    , DPM 01/08/2020     Return in about 1 month (around 02/07/2020) for recheck plantar fasciitis.

## 2020-01-23 ENCOUNTER — Other Ambulatory Visit: Payer: Self-pay | Admitting: Podiatry

## 2020-01-24 ENCOUNTER — Other Ambulatory Visit: Payer: Managed Care, Other (non HMO)

## 2020-02-08 ENCOUNTER — Other Ambulatory Visit: Payer: Managed Care, Other (non HMO)

## 2020-04-17 ENCOUNTER — Other Ambulatory Visit: Payer: Self-pay

## 2020-04-17 ENCOUNTER — Telehealth: Payer: Managed Care, Other (non HMO) | Admitting: Family

## 2020-04-17 DIAGNOSIS — T2200XS Burn of unspecified degree of shoulder and upper limb, except wrist and hand, unspecified site, sequela: Secondary | ICD-10-CM | POA: Diagnosis not present

## 2020-04-17 MED ORDER — MUPIROCIN CALCIUM 2 % EX CREA: 1.0000 "application " | TOPICAL_CREAM | Freq: Two times a day (BID) | CUTANEOUS | 0 refills | Status: DC

## 2020-04-17 NOTE — Progress Notes (Signed)
E-VISIT for Burn  We are sorry that you are not feeling well. Here is how we plan to help!  Based on what you have shared with me it looks like you may have:  2nd degree burn with or without blisters.   Second-degree burns take 14-21 days to heal.  After the burn has healed the skin may look a little darker or lighter than before., I have prescribed Mupirocin 2% ointment.  Apply with gloves to the affected area two times per day for 14 days.  Apply a non-stick dressing such as Telfa to the site after you apply the cream.  You may hold the dressing in place with either paper tape or self adhering wrap such as Coban.  Product similar to Telfa and Coban can be bought at any pharmacy.  If you have a question ask your pharmacist.  Burns are a type of painful wound caused by thermal, electrical, chemical, or electromagnetic energy.  Smoking and open flame are the leading cause of burn injury for older adults.  Scalding from a hot liquid is the leading cause of burn injury for children.  Both infants and older adults are the greatest risk for burn injury.  First degree burns effect only the outer layers of the skin.  The burn may be red and painful but the skin does not blister.  Long term tissue damage is rare.  Second degree burns involve the surface of the skin and the adjacent skin layers.  The burn sire also appears red and painful and the skin often swells and/or blisters.  Third degree burns destroy both layers of the skin and can also penetrate to underlying  Structures.  A third degree burn may not initially hurt because nerve endings were destroyed.  All third degree burns should be evaluated in person.  HOME CARE:   Wash the area gently with soap and water once a day  Apply antibiotic ointment directly to a Band-Aid or dressing and apply Band-Aid or dressing over the burn.  Change dressing every other day.  Use warm water and 1 or 2 wipes with a wet washcloth to remove any surface debris.   Some of the newer antibiotic ointments contain lidocaine that can help to control the localized pain of the burn.  You should leave intact blisters alone for the first 7 days.  After a week you may gently remove blisters.  The easiest way to do this is gently wipe away the dead skin with wet gauze or wet washcloth.  If that fails you may carefully trim off the dead skin with a pair of fine scissors.  Be sure to clean the scissors in alcohol before use.  GET HELP RIGHT AWAY IF:   The area of the burn is larger than 4 palms of our hand.  You become short of breath.  The site looks infected.  Your symptoms persist after you have completed your treatment plan.  The burn has not healed in 14 days.   MAKE SURE YOU:   Understand these instructions.  Will watch your condition.  Will get help right away if you are not doing well or get worse.  Your e-visit answers were reviewed by a board certified advanced clinical practitioner to complete your personal care plan.  Depending upon the condition, your plan could have included both over the counter or prescription medications.    Please review your pharmacy choice.  Make sure the pharmacy is open so you can pick up prescription   now.   If there is a problem, you may contact your provider through MyChart messaging and have the prescription routed to another pharmacy. Your safety is important to us.  If you have drug allergies check your prescription carefully.    For the next 24 hours you can use MyChart to ask questions about today's visit, request a non-urgent call back, or ask for a work or school excuse.  You will get an email with a link to our survey asking about your experience.  I hope that your e-visit has been valuable and will speed your recovery.  If you need an urgent face to face visit, [facility] has five urgent care centers for your convenience.     [facility] [place] Get Driving  Directions [phone] [address] [place], [place] [address] . 10 am - 6pm Monday - Friday    [facility] Get Driving Directions [phone] [address] [place], [place] [address] . 10 am to 8 pm Monday-Friday . 12 pm to 8 pm Saturday-Sunday     [facility] Get Driving Directions [phone] [address], [address] [place], [place] [address] . 8 am to 8 pm Monday-Friday . 9 am to 6 pm Saturday . 11 am to 6 pm Sunday     [facility] Get Driving Directions  [phone] [address] [place], [place] [address] . 8 am to 8 pm Monday-Friday . 8 am to 4 pm Saturday-Sunday    [facility] [place] Get Driving Directions [phone] [address] [place],  [address] . 12 pm to 6 pm Monday-Friday      Your e-visit answers were reviewed by a board certified advanced clinical practitioner to complete your personal care plan.  Thank you for using e-Visits.      Approximately 5 minutes was spent documenting and reviewing patient's chart.   

## 2020-04-18 MED ORDER — BACITRACIN 500 UNIT/GM EX OINT: 1.0000 "application " | TOPICAL_OINTMENT | Freq: Two times a day (BID) | CUTANEOUS | 0 refills | Status: DC

## 2020-04-18 NOTE — Addendum Note (Signed)
Addended by: Marco Collie on: 04/18/2020 08:33 AM   Modules accepted: Orders

## 2020-06-25 ENCOUNTER — Telehealth: Payer: Managed Care, Other (non HMO) | Admitting: Emergency Medicine

## 2020-06-25 NOTE — Progress Notes (Unsigned)
2:10 PM I attempted to connect with the patient via caregility between 1:47pm and 2:11pm.  Patient never presented themselves before me.

## 2020-08-07 DIAGNOSIS — F411 Generalized anxiety disorder: Secondary | ICD-10-CM | POA: Diagnosis not present

## 2020-08-07 DIAGNOSIS — R41840 Attention and concentration deficit: Secondary | ICD-10-CM | POA: Diagnosis not present

## 2020-08-07 DIAGNOSIS — F410 Panic disorder [episodic paroxysmal anxiety] without agoraphobia: Secondary | ICD-10-CM | POA: Diagnosis not present

## 2020-08-26 ENCOUNTER — Telehealth: Payer: Self-pay | Admitting: Physician Assistant

## 2020-08-26 DIAGNOSIS — B373 Candidiasis of vulva and vagina: Secondary | ICD-10-CM

## 2020-08-26 DIAGNOSIS — B3731 Acute candidiasis of vulva and vagina: Secondary | ICD-10-CM

## 2020-08-26 DIAGNOSIS — R300 Dysuria: Secondary | ICD-10-CM

## 2020-08-26 MED ORDER — NITROFURANTOIN MONOHYD MACRO 100 MG PO CAPS: 100.0000 mg | ORAL_CAPSULE | Freq: Two times a day (BID) | ORAL | 0 refills | Status: DC

## 2020-08-26 MED ORDER — FLUCONAZOLE 150 MG PO TABS: 150.0000 mg | ORAL_TABLET | Freq: Once | ORAL | 0 refills | Status: AC

## 2020-08-26 NOTE — Progress Notes (Signed)
We are sorry that you are not feeling well.  Here is how we plan to help!  Based on what you shared with me it looks like you most likely have a simple urinary tract infection.  A UTI (Urinary Tract Infection) is a bacterial infection of the bladder.  Most cases of urinary tract infections are simple to treat but a key part of your care is to encourage you to drink plenty of fluids and watch your symptoms carefully.  I have prescribed MacroBid 100 mg twice a day for 5 days.  Your symptoms should gradually improve. Call us if the burning in your urine worsens, you develop worsening fever, back pain or pelvic pain or if your symptoms do not resolve after completing the antibiotic.  I have sent in a diflucan for suspected yeast as well.   Urinary tract infections can be prevented by drinking plenty of water to keep your body hydrated.  Also be sure when you wipe, wipe from front to back and don't hold it in!  If possible, empty your bladder every 4 hours.  Your e-visit answers were reviewed by a board certified advanced clinical practitioner to complete your personal care plan.  Depending on the condition, your plan could have included both over the counter or prescription medications.  If there is a problem please reply  once you have received a response from your provider.  Your safety is important to [place].  If you have drug allergies check your prescription carefully.    You can use MyChart to ask questions about today's visit, request a non-urgent call back, or ask for a work or school excuse for 24 hours related to this e-Visit. If it has been greater than 24 hours you will need to follow up with your provider, or enter a new e-Visit to address those concerns.   You will get an e-mail in the next two days asking about your experience.  I hope that your e-visit has been valuable and will speed your recovery. Thank you for using e-visits.

## 2020-08-26 NOTE — Progress Notes (Signed)
I have spent 5 minutes in review of e-visit questionnaire, review and updating patient chart, medical decision making and response to patient.   Clemence Stillings Cody Haset [place], PA-C    

## 2020-08-28 ENCOUNTER — Encounter: Payer: Self-pay | Admitting: Physician Assistant

## 2020-08-28 ENCOUNTER — Telehealth: Payer: Self-pay | Admitting: Physician Assistant

## 2020-08-28 DIAGNOSIS — R3989 Other symptoms and signs involving the genitourinary system: Secondary | ICD-10-CM

## 2020-08-28 MED ORDER — PHENAZOPYRIDINE HCL 100 MG PO TABS: 100.0000 mg | ORAL_TABLET | Freq: Three times a day (TID) | ORAL | 0 refills | Status: DC | PRN

## 2020-08-28 MED ORDER — SULFAMETHOXAZOLE-TRIMETHOPRIM 800-160 MG PO TABS: 1.0000 | ORAL_TABLET | Freq: Two times a day (BID) | ORAL | 0 refills | Status: DC

## 2020-08-28 NOTE — Patient Instructions (Signed)

## 2020-08-28 NOTE — Progress Notes (Signed)
Ms. [name], slight are scheduled for a virtual visit with your provider today.    Just as we do with appointments in the office, we must obtain your consent to participate.  Your consent will be active for this visit and any virtual visit you may have with one of our providers in the next 365 days.    If you have a MyChart account, I can also send a copy of this consent to you electronically.  All virtual visits are billed to your insurance company just like a traditional visit in the office.  As this is a virtual visit, video technology does not allow for your provider to perform a traditional examination.  This may limit your provider's ability to fully assess your condition.  If your provider identifies any concerns that need to be evaluated in person or the need to arrange testing such as labs, EKG, etc, we will make arrangements to do so.    Although advances in technology are sophisticated, we cannot ensure that it will always work on either your end or our end.  If the connection with a video visit is poor, we may have to switch to a telephone visit.  With either a video or telephone visit, we are not always able to ensure that we have a secure connection.   I need to obtain your verbal consent now.   Are you willing to proceed with your visit today?   [name] has provided verbal consent on 08/28/2020 for a virtual visit (video or telephone).   [name], PA-C 08/28/2020  9:44 AM    MyChart Video Visit    Virtual Visit via Video Note   This visit type was conducted due to national recommendations for restrictions regarding the COVID-19 Pandemic (e.g. social distancing) in an effort to limit this patient's exposure and mitigate transmission in our community. This patient is at least at moderate risk for complications without adequate follow up. This format is felt to be most appropriate for this patient at this time. Physical exam was limited by quality of the video and audio  technology used for the visit.   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 The patient expressed understanding and agreed to proceed.  Patient: [name]   DOB: 11-25-1975   45 y.o. Female  MRN: [id number] Visit Date: 08/28/2020  Today's healthcare provider: [name], PA-C   No chief complaint on file.  Subjective    Urinary Tract Infection  This is a new problem. The current episode started in the past 7 days. The problem occurs intermittently. The problem has been unchanged. The quality of the pain is described as burning and aching. The pain is mild. There has been no fever. Associated symptoms include frequency and urgency. Pertinent negatives include no chills, discharge, flank pain or nausea. She has tried antibiotics and increased fluids (AZO) for the symptoms. The treatment provided no relief. Her past medical history is significant for recurrent UTIs.   Patient did an e-visit for same issue on 08/26/20 and was started on Macrobid 113m BID x 5 days. Reports she has had no symptom improvement and has even noticed her symptoms are getting a little worse.   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   Chills    ABDOMINAL PAIN, LEFT UPPER QUADRANT 10/10/2007   BIPOLAR DISORDER UNSPECIFIED 10/07/2007   GERD 10/07/2007   DIARRHEA, CHRONIC 10/07/2007   Past  Medical History:  Diagnosis Date   Abdominal pain    Anxiety    in past, records note bipolar disorder; however, states this was misdiagnosed   Dizziness    Generalized headaches    GERD (gastroesophageal reflux disease)    Kidney stones    Nausea & vomiting    PONV (postoperative nausea and vomiting)    Rectal bleeding    Sinus problem    Syncope       Medications: Outpatient Medications Prior to Visit  Medication Sig   escitalopram (LEXAPRO) 10 MG tablet Take 20 mg by mouth daily.    fluticasone (FLONASE) 50 MCG/ACT nasal spray Place 2 sprays into both nostrils daily.   LORazepam (ATIVAN) 0.5 MG tablet Take 0.5 mg by mouth daily as needed. For anxiety   nitrofurantoin, macrocrystal-monohydrate, (MACROBID) 100 MG capsule Take 1 capsule (100 mg total) by mouth 2 (two) times daily.   No facility-administered medications prior to visit.    Review of Systems  Constitutional:  Negative for chills.  Respiratory: Negative.    Cardiovascular: Negative.   Gastrointestinal:  Negative for nausea.  Genitourinary:  Positive for frequency, pelvic pain and urgency. Negative for flank pain.   Last CBC Lab Results  Component Value Date   WBC 6.5 05/07/2016   HGB 13.7 05/07/2016   HCT 40.5 05/07/2016   MCV 83.2 05/07/2016   MCH 28.2 05/07/2016   RDW 14.0 05/07/2016   PLT 256 16/12/9602   Last metabolic panel Lab Results  Component Value Date   GLUCOSE 106 (H) 05/07/2016   NA 140 05/07/2016   K 3.6 05/07/2016   CL 106 05/07/2016   CO2 26 05/07/2016   BUN 12 05/07/2016   CREATININE 0.98 05/07/2016   GFRNONAA >60 05/07/2016   GFRAA >60 05/07/2016   CALCIUM 8.8 (L) 05/07/2016   PROT 7.1 05/07/2016   ALBUMIN 3.9 05/07/2016   BILITOT 0.6 05/07/2016   ALKPHOS 72 05/07/2016   AST 20 05/07/2016   ALT 18 05/07/2016   ANIONGAP 8 05/07/2016      Objective    There were no vitals taken for this visit. BP Readings from Last 3 Encounters:  01/02/19 122/86  07/16/17 124/86   07/06/17 122/90   Wt Readings from Last 3 Encounters:  01/02/19 258 lb (117 kg)  07/16/17 292 lb (132.5 kg)  07/06/17 296 lb 6.4 oz (134.4 kg)      Physical Exam Vitals reviewed.  Constitutional:      General: She is not in acute distress.    Appearance: Normal appearance. She is well-developed. She is not ill-appearing.  HENT:     Head: Normocephalic and atraumatic.  Pulmonary:     Effort: Pulmonary effort is normal. No respiratory distress.  Musculoskeletal:     Cervical back: Normal range of motion and neck supple.  Neurological:     Mental Status: She is alert.  Psychiatric:        Mood and Affect: Mood normal.  Behavior: Behavior normal.        Thought Content: Thought content normal.        Judgment: Judgment normal.       Assessment & Plan     1. Suspected UTI - Symptoms not improving with Macrobid - Stop Macrobid and change to Bactrim as below - Pyridium added for pelvic pressure, frequency and urgency - Continue to push fluids - Seek in-person evaluation to have urine culture obtained if symptoms continue to not improve with treatment - sulfamethoxazole-trimethoprim (BACTRIM DS) 800-160 MG tablet; Take 1 tablet by mouth 2 (two) times daily.  Dispense: 10 tablet; Refill: 0 - phenazopyridine (PYRIDIUM) 100 MG tablet; Take 1 tablet (100 mg total) by mouth 3 (three) times daily as needed for pain.  Dispense: 10 tablet; Refill: 0   No follow-ups on file.     I discussed the assessment and treatment plan with the patient. The patient was provided an opportunity to ask questions and all were answered. The patient agreed with the plan and demonstrated an understanding of the instructions.   The patient was advised to call back or seek an in-person evaluation if the symptoms worsen or if the condition fails to improve as anticipated.  I provided 13 minutes of face-to-face time during this encounter via MyChart Video enabled encounter.   Rub[place] [phone] (phone) [phone] (fax)  [place]

## 2020-08-28 NOTE — Progress Notes (Signed)
Patient was notified x 4 with provider waiting 25 minutes without any attempt at patient connecting to video. Marked NOS

## 2020-08-31 ENCOUNTER — Telehealth: Payer: Self-pay | Admitting: Orthopedic Surgery

## 2020-08-31 NOTE — Progress Notes (Signed)
    Virtual Visit via Video Note   Laural [name], connected [email or web address]@ ([id number], May 01, 1975) on 08/31/20 at  1:15 PM EDT by a video-enabled telemedicine application and verified that I am speaking with the correct person using two identifiers.  Location: Patient: Virtual Visit Location Patient: Home Provider: Virtual Visit Location Provider: Home Office   I discussed the limitations of evaluation and management by telemedicine and the availability of in person appointments. The patient expressed understanding and agreed to proceed.    History of Present Illness: [name] is a 45 y.o. who identifies as a female who was assigned female at birth, and is being seen today for UTI. She did an evisit on the 20th and was given Macrobid. She did a video visit on the 22nd as she was no better and was changed to Bactrim. She still not improved.  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 , Disp: , Rfl:    fluticasone (FLONASE) 50 MCG/ACT nasal spray, Place 2 sprays into  both nostrils daily., Disp: 16 g, Rfl: 5   LORazepam (ATIVAN) 0.5 MG tablet, Take 0.5 mg by mouth daily as needed. For anxiety, Disp: , Rfl:    phenazopyridine (PYRIDIUM) 100 MG tablet, Take 1 tablet (100 mg total) by mouth 3 (three) times daily as needed for pain., Disp: 10 tablet, Rfl: 0   sulfamethoxazole-trimethoprim (BACTRIM DS) 800-160 MG tablet, Take 1 tablet by mouth 2 (two) times daily., Disp: 10 tablet, Rfl: 0  Observations/Objective Patient is well-developed, well-nourished in no acute distress.  Resting comfortably  at home.  Head is normocephalic, atraumatic.  No labored breathing.  Speech is clear and coherent with logical content.  Patient is alert and oriented at baseline.    Assessment and Plan: 1. UTI symptoms Given recalcitrant symptoms despite appropriate treatment recommended in-person visit to confirm diagnosis with urinalysis and culture.  Follow Up Instructions: I discussed the assessment and treatment plan with the patient. The patient was provided an opportunity to ask questions and all were answered. The patient agreed with the plan and demonstrated an understanding of the instructions.  A copy of instructions were sent to the patient via MyChart.  The patient was advised to call back or seek an in-person evaluation if the symptoms worsen or if the condition fails to improve as anticipated.  Time:  I spent 5 minutes with the patient via telehealth technology discussing the above problems/concerns.    Lisette Abu, PA-C

## 2020-09-01 ENCOUNTER — Ambulatory Visit: Payer: Self-pay

## 2020-09-01 DIAGNOSIS — N390 Urinary tract infection, site not specified: Secondary | ICD-10-CM | POA: Diagnosis not present

## 2020-09-01 DIAGNOSIS — R350 Frequency of micturition: Secondary | ICD-10-CM | POA: Diagnosis not present

## 2020-09-06 DIAGNOSIS — R309 Painful micturition, unspecified: Secondary | ICD-10-CM | POA: Diagnosis not present

## 2020-09-06 DIAGNOSIS — R300 Dysuria: Secondary | ICD-10-CM | POA: Diagnosis not present

## 2020-09-06 DIAGNOSIS — N760 Acute vaginitis: Secondary | ICD-10-CM | POA: Diagnosis not present

## 2020-09-06 DIAGNOSIS — F419 Anxiety disorder, unspecified: Secondary | ICD-10-CM | POA: Diagnosis not present

## 2020-09-24 DIAGNOSIS — H9191 Unspecified hearing loss, right ear: Secondary | ICD-10-CM | POA: Diagnosis not present

## 2020-09-25 DIAGNOSIS — R41840 Attention and concentration deficit: Secondary | ICD-10-CM | POA: Diagnosis not present

## 2020-09-25 DIAGNOSIS — F902 Attention-deficit hyperactivity disorder, combined type: Secondary | ICD-10-CM | POA: Diagnosis not present

## 2020-09-25 DIAGNOSIS — Z79899 Other long term (current) drug therapy: Secondary | ICD-10-CM | POA: Diagnosis not present

## 2020-12-02 DIAGNOSIS — U071 COVID-19: Secondary | ICD-10-CM | POA: Diagnosis not present

## 2021-01-22 ENCOUNTER — Telehealth: Payer: BC Managed Care – PPO | Admitting: Nurse Practitioner

## 2021-01-22 DIAGNOSIS — G43821 Menstrual migraine, not intractable, with status migrainosus: Secondary | ICD-10-CM

## 2021-01-22 MED ORDER — SUMATRIPTAN SUCCINATE 50 MG PO TABS: 50.0000 mg | ORAL_TABLET | ORAL | 0 refills | Status: DC | PRN

## 2021-01-22 NOTE — Patient Instructions (Signed)
Migraine Headache A migraine headache is an intense, throbbing pain on one side or both sides of the head. Migraine headaches may also cause other symptoms, such as nausea, vomiting, and sensitivity to light and noise. A migraine headache can last from 4 hours to 3 days. Talk with your doctor about what things may bring on (trigger) your migraine headaches. What are the causes? The exact cause of this condition is not known. However, a migraine may be caused when nerves in the brain become irritated and release chemicals that cause inflammation of blood vessels. This inflammation causes pain. This condition may be triggered or caused by: Drinking alcohol. Smoking. Taking medicines, such as: Medicine used to treat chest pain (nitroglycerin). Birth control pills. Estrogen. Certain blood pressure medicines. Eating or drinking products that contain nitrates, glutamate, aspartame, or tyramine. Aged cheeses, chocolate, or caffeine may also be triggers. Doing physical activity. Other things that may trigger a migraine headache include: Menstruation. Pregnancy. Hunger. Stress. Lack of sleep or too much sleep. Weather changes. Fatigue. What increases the risk? The following factors may make you more likely to experience migraine headaches: Being a certain age. This condition is more common in people who are 25-55 years old. Being female. Having a family history of migraine headaches. Being Caucasian. Having a mental health condition, such as depression or anxiety. Being obese. What are the signs or symptoms? The main symptom of this condition is pulsating or throbbing pain. This pain may: Happen in any area of the head, such as on one side or both sides. Interfere with daily activities. Get worse with physical activity. Get worse with exposure to bright lights or loud noises. Other symptoms may include: Nausea. Vomiting. Dizziness. General sensitivity to bright lights, loud noises, or  smells. Before you get a migraine headache, you may get warning signs (an aura). An aura may include: Seeing flashing lights or having blind spots. Seeing bright spots, halos, or zigzag lines. Having tunnel vision or blurred vision. Having numbness or a tingling feeling. Having trouble talking. Having muscle weakness. Some people have symptoms after a migraine headache (postdromal phase), such as: Feeling tired. Difficulty concentrating. How is this diagnosed? A migraine headache can be diagnosed based on: Your symptoms. A physical exam. Tests, such as: CT scan or an MRI of the head. These imaging tests can help rule out other causes of headaches. Taking fluid from the spine (lumbar puncture) and analyzing it (cerebrospinal fluid analysis, or CSF analysis). How is this treated? This condition may be treated with medicines that: Relieve pain. Relieve nausea. Prevent migraine headaches. Treatment for this condition may also include: Acupuncture. Lifestyle changes like avoiding foods that trigger migraine headaches. Biofeedback. Cognitive behavioral therapy. Follow these instructions at home: Medicines Take over-the-counter and prescription medicines only as told by your health care provider. Ask your health care provider if the medicine prescribed to you: Requires you to avoid driving or using heavy machinery. Can cause constipation. You may need to take these actions to prevent or treat constipation: Drink enough fluid to keep your urine pale yellow. Take over-the-counter or prescription medicines. Eat foods that are high in fiber, such as beans, whole grains, and fresh fruits and vegetables. Limit foods that are high in fat and processed sugars, such as fried or sweet foods. Lifestyle Do not drink alcohol. Do not use any products that contain nicotine or tobacco, such as cigarettes, e-cigarettes, and chewing tobacco. If you need help quitting, ask your health care  provider. Get at least 8   hours of sleep every night. Find ways to manage stress, such as meditation, deep breathing, or yoga. General instructions   Keep a journal to find out what may trigger your migraine headaches. For example, write down: What you eat and drink. How much sleep you get. Any change to your diet or medicines. If you have a migraine headache: Avoid things that make your symptoms worse, such as bright lights. It may help to lie down in a dark, quiet room. Do not drive or use heavy machinery. Ask your health care provider what activities are safe for you while you are experiencing symptoms. Keep all follow-up visits as told by your health care provider. This is important. Contact a health care provider if: You develop symptoms that are different or more severe than your usual migraine headache symptoms. You have more than 15 headache days in one month. Get help right away if: Your migraine headache becomes severe. Your migraine headache lasts longer than 72 hours. You have a fever. You have a stiff neck. You have vision loss. Your muscles feel weak or like you cannot control them. You start to lose your balance often. You have trouble walking. You faint. You have a seizure. Summary A migraine headache is an intense, throbbing pain on one side or both sides of the head. Migraines may also cause other symptoms, such as nausea, vomiting, and sensitivity to light and noise. This condition may be treated with medicines and lifestyle changes. You may also need to avoid certain things that trigger a migraine headache. Keep a journal to find out what may trigger your migraine headaches. Contact your health care provider if you have more than 15 headache days in a month or you develop symptoms that are different or more severe than your usual migraine headache symptoms. This information is not intended to replace advice given to you by your health care provider. Make sure you  discuss any questions you have with your health care provider. Document Revised: 06/17/2018 Document Reviewed: 04/07/2018 Elsevier Patient Education  2022 [employer] Inc.  

## 2021-01-22 NOTE — Progress Notes (Signed)
Virtual Visit Consent   [name], you are scheduled for a virtual visit with Mary-Margaret Hassell Done, [facility], a [facility] provider, today.     Just as with appointments in the office, your consent must be obtained to participate.  Your consent will be active for this visit and any virtual visit you may have with one of our providers in the next 365 days.     If you have a MyChart account, a copy of this consent can be sent to you electronically.  All virtual visits are billed to your insurance company just like a traditional visit in the office.    As this is a virtual visit, video technology does not allow for your provider to perform a traditional examination.  This may limit your provider's ability to fully assess your condition.  If your provider identifies any concerns that need to be evaluated in person or the need to arrange testing (such as labs, EKG, etc.), we will make arrangements to do so.     Although advances in technology are sophisticated, we cannot ensure that it will always work on either your end or our end.  If the connection with a video visit is poor, the visit may have to be switched to a telephone visit.  With either a video or telephone visit, we are not always able to ensure that we have a secure connection.     I need to obtain your verbal consent now.   Are you willing to proceed with your visit today? YES   Raul Winterhalter has provided verbal consent on 01/22/2021 for a virtual visit (video or telephone).   Mary-Margaret Hassell Done, FNP   Date: 01/22/2021 9:59 AM   Virtual Visit via Video Note   I, Mary-Margaret Hassell Done, connected with [name] ([id number], 1975/04/24) on 01/22/21 at 10:00 AM EST by a video-enabled telemedicine application and verified that I am speaking with the correct person using two identifiers.  Location: Patient: Virtual Visit Location Patient: Home Provider: Virtual Visit Location Provider: Mobile   I discussed the  limitations of evaluation and management by telemedicine and the availability of in person appointments. The patient expressed understanding and agreed to proceed.    History of Present Illness: [name] is a 45 y.o. who identifies as a female who was assigned female at birth, and is being seen today for migraine.  HPI: Patient states that for the last several months shehas been getting migraines when her period is about to start. It does not occur with every menses. This time migraine started on right side of head yesterday. She has some nausea with them. Rates migraine 6/10 right now. Laying down in dark room helps some. Standing and moving around increases pain. She has never taken migraine meds.  Review of Systems  Constitutional:  Negative for chills, fever and malaise/fatigue.  Eyes:  Positive for photophobia. Negative for blurred vision, double vision, pain, discharge and redness.  Gastrointestinal:  Positive for nausea and vomiting.  Neurological:  Positive for headaches.   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 , Disp: , Rfl:    fluticasone (FLONASE) 50 MCG/ACT nasal spray, Place 2 sprays into both nostrils daily., Disp: 16 g, Rfl: 5   LORazepam (ATIVAN) 0.5 MG tablet, Take 0.5 mg by mouth daily as needed. For anxiety, Disp: , Rfl:    phenazopyridine (PYRIDIUM) 100 MG tablet, Take 1 tablet (100 mg total) by mouth 3 (three) times daily as needed for pain., Disp: 10 tablet, Rfl: 0   sulfamethoxazole-trimethoprim (BACTRIM DS) 800-160 MG tablet, Take 1 tablet by mouth 2 (two) times daily., Disp: 10 tablet, Rfl: 0  Observations/Objective: Patient is well-developed, well-nourished in no acute distress.  Resting comfortably  at home.  Head is normocephalic, atraumatic.  No labored breathing.  Speech is clear and coherent with logical content.  Patient is alert and oriented at baseline.  Laying down in dark room  Assessment and Plan:  Avelina Mcclurkin in today with chief complaint of No chief complaint on file.   1. Menstrual migraine with status migrainosus, not intractable Lay in dark room Rest If continues will need to see Meds ordered this encounter  Medications   SUMAtriptan (IMITREX) 50 MG tablet    Sig: Take 1 tablet (50 mg total) by mouth every 2 (two) hours as needed for migraine. May repeat in 2 hours if headache persists or recurs.    Dispense:  10 tablet    Refill:  0    Order Specific Question:   Supervising Provider    Answer:   Noemi Chapel [[id number]]   Discussed FLMA     Follow Up Instructions: I discussed the assessment and treatment plan with the patient. The patient was provided an opportunity to ask questions and all were answered. The patient agreed with the plan and demonstrated an understanding of the instructions.  A copy of instructions were sent to the patient via MyChart.  The patient was advised to call back or seek an in-person evaluation if the symptoms worsen or if the condition fails to improve as anticipated.  Time:  I  spent 11 minutes with the patient via telehealth technology discussing the above problems/concerns.    Mary-Margaret Hassell Done, FNP

## 2021-02-17 DIAGNOSIS — F419 Anxiety disorder, unspecified: Secondary | ICD-10-CM | POA: Diagnosis not present

## 2021-02-20 ENCOUNTER — Other Ambulatory Visit: Payer: Self-pay | Admitting: Obstetrics and Gynecology

## 2021-02-20 DIAGNOSIS — Z1509 Genetic susceptibility to other malignant neoplasm: Secondary | ICD-10-CM

## 2021-02-20 DIAGNOSIS — Z1501 Genetic susceptibility to malignant neoplasm of breast: Secondary | ICD-10-CM

## 2021-03-05 ENCOUNTER — Other Ambulatory Visit: Payer: Self-pay | Admitting: Obstetrics and Gynecology

## 2021-03-05 DIAGNOSIS — Z1231 Encounter for screening mammogram for malignant neoplasm of breast: Secondary | ICD-10-CM

## 2021-03-25 ENCOUNTER — Ambulatory Visit: Payer: BC Managed Care – PPO

## 2021-03-25 ENCOUNTER — Other Ambulatory Visit: Payer: Self-pay | Admitting: Obstetrics and Gynecology

## 2021-03-25 DIAGNOSIS — N6320 Unspecified lump in the left breast, unspecified quadrant: Secondary | ICD-10-CM

## 2021-04-01 ENCOUNTER — Other Ambulatory Visit: Payer: BC Managed Care – PPO

## 2021-04-21 DIAGNOSIS — Z79899 Other long term (current) drug therapy: Secondary | ICD-10-CM | POA: Diagnosis not present

## 2021-04-21 DIAGNOSIS — F902 Attention-deficit hyperactivity disorder, combined type: Secondary | ICD-10-CM | POA: Diagnosis not present

## 2021-04-23 DIAGNOSIS — F902 Attention-deficit hyperactivity disorder, combined type: Secondary | ICD-10-CM | POA: Diagnosis not present

## 2021-05-01 ENCOUNTER — Other Ambulatory Visit: Payer: BC Managed Care – PPO

## 2021-07-03 ENCOUNTER — Ambulatory Visit
Admission: RE | Admit: 2021-07-03 | Discharge: 2021-07-03 | Disposition: A | Discharge status: Home / Self Care | Payer: BC Managed Care – PPO | Source: Ambulatory Visit | Attending: Obstetrics and Gynecology | Admitting: Obstetrics and Gynecology

## 2021-07-03 DIAGNOSIS — R928 Other abnormal and inconclusive findings on diagnostic imaging of breast: Secondary | ICD-10-CM | POA: Diagnosis not present

## 2021-07-03 DIAGNOSIS — N6311 Unspecified lump in the right breast, upper outer quadrant: Secondary | ICD-10-CM | POA: Diagnosis not present

## 2021-07-03 DIAGNOSIS — N6320 Unspecified lump in the left breast, unspecified quadrant: Secondary | ICD-10-CM

## 2021-09-12 ENCOUNTER — Telehealth: Payer: BC Managed Care – PPO | Admitting: Emergency Medicine

## 2021-09-12 DIAGNOSIS — N3000 Acute cystitis without hematuria: Secondary | ICD-10-CM | POA: Diagnosis not present

## 2021-09-12 MED ORDER — CEPHALEXIN 500 MG PO CAPS: 500.0000 mg | ORAL_CAPSULE | Freq: Two times a day (BID) | ORAL | 0 refills | Status: DC

## 2021-09-12 MED ORDER — NITROFURANTOIN MONOHYD MACRO 100 MG PO CAPS: 100.0000 mg | ORAL_CAPSULE | Freq: Two times a day (BID) | ORAL | 0 refills | Status: DC

## 2021-09-12 MED ORDER — PHENAZOPYRIDINE HCL 100 MG PO TABS: 100.0000 mg | ORAL_TABLET | Freq: Three times a day (TID) | ORAL | 0 refills | Status: DC | PRN

## 2021-09-12 NOTE — Progress Notes (Signed)
Virtual Visit Consent   [name], you are scheduled for a virtual visit with a [facility] provider today. Just as with appointments in the office, your consent must be obtained to participate. Your consent will be active for this visit and any virtual visit you may have with one of our providers in the next 365 days. If you have a MyChart account, a copy of this consent can be sent to you electronically.  As this is a virtual visit, video technology does not allow for your provider to perform a traditional examination. This may limit your provider's ability to fully assess your condition. If your provider identifies any concerns that need to be evaluated in person or the need to arrange testing (such as labs, EKG, etc.), we will make arrangements to do so. Although advances in technology are sophisticated, we cannot ensure that it will always work on either your end or our end. If the connection with a video visit is poor, the visit may have to be switched to a telephone visit. With either a video or telephone visit, we are not always able to ensure that we have a secure connection.  By engaging in this virtual visit, you consent to the provision of healthcare and authorize for your insurance to be billed (if applicable) for the services provided during this visit. Depending on your insurance coverage, you may receive a charge related to this service.  I need to obtain your verbal consent now. Are you willing to proceed with your visit today? [name] has provided verbal consent on 09/12/2021 for a virtual visit (video or telephone). Carvel Getting, NP  Date: 09/12/2021 10:41 AM  Virtual Visit via Video Note   I, Carvel Getting, connected with  [name]  ([id number], 03-Feb-1976) on 09/12/21 at 10:30 AM EDT by a video-enabled telemedicine application and verified that I am speaking with the correct person using two identifiers.  Location: Patient: Virtual Visit Location Patient:  Home Provider: Virtual Visit Location Provider: Home Office   I discussed the limitations of evaluation and management by telemedicine and the availability of in person appointments. The patient expressed understanding and agreed to proceed.    History of Present Illness: [name] is a 46 y.o. who identifies as a female who was assigned female at birth, and is being seen today for urinary tract infection.  She reports frequency and urgency for the last 4 days.  Has been increasing her p.o. fluids and using AZO with minimal relief.  Denies abdominal pain, flank pain, nausea, vomiting, fever, chills.  Feels like when she had a previous UTI.  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/2017   Morbid obesity ([place]) 07/17/2017   Abdominal pain 03/28/2012   Epigastric pain 02/25/2012   Abdominal pain, LLQ 02/16/2011   Nausea & vomiting 02/16/2011   Rectal bleed 02/16/2011   ALT (SGPT) level raised 02/16/2011   Chills    ABDOMINAL PAIN, LEFT UPPER QUADRANT 10/10/2007   BIPOLAR DISORDER UNSPECIFIED 10/07/2007   GERD 10/07/2007   DIARRHEA, CHRONIC 10/07/2007    Allergies:  Allergies  Allergen Reactions   Percocet [Oxycodone-Acetaminophen]  Nausea And Vomiting    Only in high doses   Metoclopramide Hcl Palpitations    tachycardia   Medications:  Current Outpatient Medications:    cephALEXin (KEFLEX) 500 MG capsule, Take 1 capsule (500 mg total) by mouth 2 (two) times daily for 7 days., Disp: 14 capsule, Rfl: 0   phenazopyridine (PYRIDIUM) 100 MG tablet, Take 1 tablet (100 mg total) by mouth 3 (three) times daily as needed for pain., Disp: 10  tablet, Rfl: 0   escitalopram (LEXAPRO) 10 MG tablet, Take 20 mg by mouth daily. , Disp: , Rfl:    fluticasone (FLONASE) 50 MCG/ACT nasal spray, Place 2 sprays into both nostrils daily., Disp: 16 g, Rfl: 5   LORazepam (ATIVAN) 0.5 MG tablet, Take 0.5 mg by mouth daily as needed. For anxiety, Disp: , Rfl:    sulfamethoxazole-trimethoprim (BACTRIM DS) 800-160 MG tablet, Take 1 tablet by mouth 2 (two) times daily., Disp: 10 tablet, Rfl: 0   SUMAtriptan (IMITREX) 50 MG tablet, Take 1 tablet (50 mg total) by mouth every 2 (two) hours as needed for migraine. May repeat in 2 hours if headache persists or recurs., Disp: 10 tablet, Rfl: 0  Observations/Objective: Patient is well-developed, well-nourished in no acute distress.  Resting comfortably  at home.  Head is normocephalic, atraumatic.  No labored breathing.  Speech is clear and coherent with logical content.  Patient is alert and oriented at baseline.    Assessment and Plan: 1. Acute cystitis without hematuria  Prescribed Keflex and Pyridium as requested.  Reviewed supportive care measures.  Follow Up Instructions: I discussed the assessment and treatment plan with the patient. The patient was provided an opportunity to ask questions and all were answered. The patient agreed with the plan and demonstrated an understanding of the instructions.  A copy of instructions were sent to the patient via MyChart unless otherwise noted below.   The patient was advised to call back or seek an in-person evaluation if the symptoms worsen or if the condition fails to improve as anticipated.  Time:  I spent 10 minutes with the patient via telehealth technology discussing the above problems/concerns.    Carvel Getting, NP

## 2021-09-12 NOTE — Progress Notes (Signed)
Virtual Visit Consent   [name], you are scheduled for a virtual visit with a [facility] provider today. Just as with appointments in the office, your consent must be obtained to participate. Your consent will be active for this visit and any virtual visit you may have with one of our providers in the next 365 days. If you have a MyChart account, a copy of this consent can be sent to you electronically.  As this is a virtual visit, video technology does not allow for your provider to perform a traditional examination. This may limit your provider's ability to fully assess your condition. If your provider identifies any concerns that need to be evaluated in person or the need to arrange testing (such as labs, EKG, etc.), we will make arrangements to do so. Although advances in technology are sophisticated, we cannot ensure that it will always work on either your end or our end. If the connection with a video visit is poor, the visit may have to be switched to a telephone visit. With either a video or telephone visit, we are not always able to ensure that we have a secure connection.  By engaging in this virtual visit, you consent to the provision of healthcare and authorize for your insurance to be billed (if applicable) for the services provided during this visit. Depending on your insurance coverage, you may receive a charge related to this service.  I need to obtain your verbal consent now. Are you willing to proceed with your visit today? [name] has provided verbal consent on 09/12/2021 for a virtual visit (video or telephone). Carvel Getting, NP  Date: 09/12/2021 12:34 PM  Virtual Visit via Video Note   I, Carvel Getting, connected with  [name]  ([id number], July 03, 1975) on 09/12/21 at 12:30 PM EDT by a video-enabled telemedicine application and verified that I am speaking with the correct person using two identifiers. Interactive audio and video communications were  attempted, although failed due to patient's inability to connect to video. Continued visit with audio only interaction with patient agreement.  Location: Patient: Virtual Visit Location Patient: Home Provider: Virtual Visit Location Provider: Home Office   I discussed the limitations of evaluation and management by telemedicine and the availability of in person appointments. The patient expressed understanding and agreed to proceed.    History of Present Illness: [name] is a 46 y.o. who identifies as a female who was assigned female at birth, and is being seen today for UTI. Pt was seen earlier today for same, rx keflex. Pharmacy informed pt she previously had an allergic reaction to keflex. I have discontinued keflex and added it to her allergy list.  HPI: HPI  Problems:  Patient Active Problem List   Diagnosis Date Noted   Menstrual migraine with status migrainosus, not intractable 01/22/2021   Prediabetes 01/25/2018   Allergic rhinitis 01/24/2018   Generalized anxiety disorder with panic attacks 01/24/2018   Morbid obesity with BMI of 45.0-49.9, adult ([place]) 01/24/2018   BRCA2 gene mutation positive 12/09/2017   Morbid obesity ([place]) 07/17/2017   Abdominal pain 03/28/2012   Epigastric pain 02/25/2012   Abdominal pain, LLQ 02/16/2011   Nausea & vomiting 02/16/2011   Rectal bleed 02/16/2011   ALT (SGPT) level raised 02/16/2011   Chills    ABDOMINAL PAIN, LEFT UPPER QUADRANT 10/10/2007   BIPOLAR DISORDER UNSPECIFIED 10/07/2007   GERD 10/07/2007   DIARRHEA, CHRONIC 10/07/2007    Allergies:  Allergies  Allergen Reactions   Keflex [Cephalexin]     Throat swelling, face itching and redness   Percocet [Oxycodone-Acetaminophen] Nausea And Vomiting    Only in high doses   Metoclopramide Hcl Palpitations    tachycardia   Medications:  Current Outpatient Medications:    nitrofurantoin, macrocrystal-monohydrate, (MACROBID) 100 MG capsule, Take 1 capsule (100 mg total) by  mouth 2 (two) times daily., Disp: 14 capsule, Rfl: 0   escitalopram (LEXAPRO) 10 MG tablet, Take 20 mg by mouth daily. , Disp: , Rfl:    fluticasone (FLONASE) 50 MCG/ACT nasal spray, Place 2 sprays into both nostrils daily., Disp: 16 g, Rfl: 5   LORazepam (ATIVAN) 0.5 MG tablet, Take 0.5 mg by mouth daily as needed. For anxiety, Disp: , Rfl:    phenazopyridine (PYRIDIUM) 100 MG tablet, Take 1 tablet (100 mg total) by mouth 3 (three) times daily as needed for pain., Disp: 10 tablet, Rfl: 0  Observations/Objective: Patient is no acute distress.  No labored breathing.  Speech is clear and coherent with logical content.  Patient is alert and oriented at baseline.    Assessment and Plan: 1. Acute cystitis without hematuria  Rx macrobid.   Follow Up Instructions: I discussed the assessment and treatment plan with the patient. The patient was provided an opportunity to ask questions and all were answered. The patient agreed with the plan and demonstrated an understanding of the instructions.  A copy of instructions were sent to the patient via MyChart unless otherwise noted below.   The patient was advised to call back or seek an in-person evaluation if the symptoms worsen or if the condition fails to improve as anticipated.  Time:  I spent 3 minutes with the patient via telehealth technology discussing the above problems/concerns.    Carvel Getting, NP

## 2021-09-12 NOTE — Patient Instructions (Signed)
  [name], thank you for joining Bridget Parsons, NP for today's virtual visit.  While this provider is not your primary care provider (PCP), if your PCP is located in our provider database this encounter information will be shared with them immediately following your visit.  Consent: (Patient) [name] provided verbal consent for this virtual visit at the beginning of the encounter.  Current Medications:  Current Outpatient Medications:    cephALEXin (KEFLEX) 500 MG capsule, Take 1 capsule (500 mg total) by mouth 2 (two) times daily for 7 days., Disp: 14 capsule, Rfl: 0   phenazopyridine (PYRIDIUM) 100 MG tablet, Take 1 tablet (100 mg total) by mouth 3 (three) times daily as needed for pain., Disp: 10 tablet, Rfl: 0   escitalopram (LEXAPRO) 10 MG tablet, Take 20 mg by mouth daily. , Disp: , Rfl:    fluticasone (FLONASE) 50 MCG/ACT nasal spray, Place 2 sprays into both nostrils daily., Disp: 16 g, Rfl: 5   LORazepam (ATIVAN) 0.5 MG tablet, Take 0.5 mg by mouth daily as needed. For anxiety, Disp: , Rfl:    sulfamethoxazole-trimethoprim (BACTRIM DS) 800-160 MG tablet, Take 1 tablet by mouth 2 (two) times daily., Disp: 10 tablet, Rfl: 0   SUMAtriptan (IMITREX) 50 MG tablet, Take 1 tablet (50 mg total) by mouth every 2 (two) hours as needed for migraine. May repeat in 2 hours if headache persists or recurs., Disp: 10 tablet, Rfl: 0   Medications ordered in this encounter:  Meds ordered this encounter  Medications   cephALEXin (KEFLEX) 500 MG capsule    Sig: Take 1 capsule (500 mg total) by mouth 2 (two) times daily for 7 days.    Dispense:  14 capsule    Refill:  0   phenazopyridine (PYRIDIUM) 100 MG tablet    Sig: Take 1 tablet (100 mg total) by mouth 3 (three) times daily as needed for pain.    Dispense:  10 tablet    Refill:  0     *If you need refills on other medications prior to your next appointment, please contact your pharmacy*  Follow-Up: Call back or seek an  in-person evaluation if the symptoms worsen or if the condition fails to improve as anticipated.  Other Instructions Continue to drink plenty of fluids and stay hydrated.  Finish all the antibiotics, even if you are feeling better.   If you have been instructed to have an in-person evaluation today at a local Urgent Care facility, please use the link below. It will take you to a list of all of our available [facility], including address, phone number and hours of operation. Please do not delay care.  [facility]  If you or a family member do not have a primary care provider, use the link below to schedule a visit and establish care. When you choose a [facility] primary care physician or advanced practice provider, you gain a long-term partner in health. Find a Primary Care Provider  Learn more about [facility]'s in-office and virtual care options:  - Get Care Now

## 2021-10-09 DIAGNOSIS — F411 Generalized anxiety disorder: Secondary | ICD-10-CM | POA: Diagnosis not present

## 2021-10-09 DIAGNOSIS — Z0000 Encounter for general adult medical examination without abnormal findings: Secondary | ICD-10-CM | POA: Diagnosis not present

## 2021-10-09 DIAGNOSIS — R7303 Prediabetes: Secondary | ICD-10-CM | POA: Diagnosis not present

## 2021-10-09 DIAGNOSIS — Z1501 Genetic susceptibility to malignant neoplasm of breast: Secondary | ICD-10-CM | POA: Diagnosis not present

## 2021-10-31 DIAGNOSIS — Z03818 Encounter for observation for suspected exposure to other biological agents ruled out: Secondary | ICD-10-CM | POA: Diagnosis not present

## 2021-10-31 DIAGNOSIS — W57XXXA Bitten or stung by nonvenomous insect and other nonvenomous arthropods, initial encounter: Secondary | ICD-10-CM | POA: Diagnosis not present

## 2021-10-31 DIAGNOSIS — S0006XA Insect bite (nonvenomous) of scalp, initial encounter: Secondary | ICD-10-CM | POA: Diagnosis not present

## 2021-10-31 DIAGNOSIS — M791 Myalgia, unspecified site: Secondary | ICD-10-CM | POA: Diagnosis not present

## 2022-01-05 ENCOUNTER — Telehealth: Payer: BC Managed Care – PPO | Admitting: Physician Assistant

## 2022-01-05 DIAGNOSIS — J019 Acute sinusitis, unspecified: Secondary | ICD-10-CM

## 2022-01-05 DIAGNOSIS — B9689 Other specified bacterial agents as the cause of diseases classified elsewhere: Secondary | ICD-10-CM | POA: Diagnosis not present

## 2022-01-05 DIAGNOSIS — H6993 Unspecified Eustachian tube disorder, bilateral: Secondary | ICD-10-CM | POA: Diagnosis not present

## 2022-01-05 MED ORDER — PREDNISONE 20 MG PO TABS: 20.0000 mg | ORAL_TABLET | Freq: Every day | ORAL | 0 refills | Status: DC

## 2022-01-05 MED ORDER — AMOXICILLIN-POT CLAVULANATE 875-125 MG PO TABS: 1.0000 | ORAL_TABLET | Freq: Two times a day (BID) | ORAL | 0 refills | Status: DC

## 2022-01-05 NOTE — Progress Notes (Signed)
Virtual Visit Consent   [name], you are scheduled for a virtual visit with a [facility] provider today. Just as with appointments in the office, your consent must be obtained to participate. Your consent will be active for this visit and any virtual visit you may have with one of our providers in the next 365 days. If you have a MyChart account, a copy of this consent can be sent to you electronically.  As this is a virtual visit, video technology does not allow for your provider to perform a traditional examination. This may limit your provider's ability to fully assess your condition. If your provider identifies any concerns that need to be evaluated in person or the need to arrange testing (such as labs, EKG, etc.), we will make arrangements to do so. Although advances in technology are sophisticated, we cannot ensure that it will always work on either your end or our end. If the connection with a video visit is poor, the visit may have to be switched to a telephone visit. With either a video or telephone visit, we are not always able to ensure that we have a secure connection.  By engaging in this virtual visit, you consent to the provision of healthcare and authorize for your insurance to be billed (if applicable) for the services provided during this visit. Depending on your insurance coverage, you may receive a charge related to this service.  I need to obtain your verbal consent now. Are you willing to proceed with your visit today? Shenita Trego has provided verbal consent on 01/05/2022 for a virtual visit (video or telephone). [name], PA-C  Date: 01/05/2022 1:13 PM  Virtual Visit via Video Note   I, [name], connected with  Daneille Desilva  ([id number], 14-Aug-1975) on 01/05/22 at  1:00 PM EDT by a video-enabled telemedicine application and verified that I am speaking with the correct person using two identifiers.  Location: Patient: Virtual Visit  Location Patient: Home Provider: Virtual Visit Location Provider: Home Office   I discussed the limitations of evaluation and management by telemedicine and the availability of in person appointments. The patient expressed understanding and agreed to proceed.    History of Present Illness: [name] is a 46 y.o. who identifies as a female who was assigned female at birth, and is being seen today for possible sinus infection.  HPI: Sinusitis This is a new problem. The current episode started 1 to 4 weeks ago (2-3 weeks). The problem has been gradually worsening since onset. There has been no fever. Associated symptoms include congestion, coughing, ear pain, headaches and sinus pressure. Pertinent negatives include no hoarse voice. (Vertigo, bad taste to mucus) Treatments tried: mucinex, mucinex sinus max, mucinex nighttime, sudafed. The treatment provided no relief.    Problems:  Patient Active Problem List   Diagnosis Date Noted   Menstrual migraine with status migrainosus, not intractable 01/22/2021   Prediabetes 01/25/2018   Allergic rhinitis 01/24/2018   Generalized anxiety disorder with panic attacks 01/24/2018   Morbid obesity with BMI of 45.0-49.9, adult ([place]) 01/24/2018   BRCA2 gene mutation positive 12/09/2017   Morbid obesity ([place]) 07/17/2017   Abdominal pain 03/28/2012   Epigastric pain 02/25/2012   Abdominal pain, LLQ 02/16/2011   Nausea & vomiting 02/16/2011   Rectal bleed 02/16/2011   ALT (SGPT) level raised 02/16/2011   Chills    ABDOMINAL PAIN, LEFT UPPER QUADRANT 10/10/2007   BIPOLAR DISORDER UNSPECIFIED 10/07/2007   GERD  10/07/2007   DIARRHEA, CHRONIC 10/07/2007    Allergies:  Allergies  Allergen Reactions   Keflex [Cephalexin]     Throat swelling, face itching and redness   Percocet [Oxycodone-Acetaminophen] Nausea And Vomiting    Only in high doses   Metoclopramide Hcl Palpitations    tachycardia   Medications:  Current Outpatient Medications:     amoxicillin-clavulanate (AUGMENTIN) 875-125 MG tablet, Take 1 tablet by mouth 2 (two) times daily., Disp: 20 tablet, Rfl: 0   predniSONE (DELTASONE) 20 MG tablet, Take 1 tablet (20 mg total) by mouth daily with breakfast., Disp: 7 tablet, Rfl: 0   escitalopram (LEXAPRO) 10 MG tablet, Take 20 mg by mouth daily. , Disp: , Rfl:    fluticasone (FLONASE) 50 MCG/ACT nasal spray, Place 2 sprays into both nostrils daily., Disp: 16 g, Rfl: 5   LORazepam (ATIVAN) 0.5 MG tablet, Take 0.5 mg by mouth daily as needed. For anxiety, Disp: , Rfl:    nitrofurantoin, macrocrystal-monohydrate, (MACROBID) 100 MG capsule, Take 1 capsule (100 mg total) by mouth 2 (two) times daily., Disp: 14 capsule, Rfl: 0   phenazopyridine (PYRIDIUM) 100 MG tablet, Take 1 tablet (100 mg total) by mouth 3 (three) times daily as needed for pain., Disp: 10 tablet, Rfl: 0  Observations/Objective: Patient is well-developed, well-nourished in no acute distress.  Resting comfortably at home.  Head is normocephalic, atraumatic.  No labored breathing.  Speech is clear and coherent with logical content.  Patient is alert and oriented at baseline.    Assessment and Plan: 1. Acute bacterial sinusitis - amoxicillin-clavulanate (AUGMENTIN) 875-125 MG tablet; Take 1 tablet by mouth 2 (two) times daily.  Dispense: 20 tablet; Refill: 0 - predniSONE (DELTASONE) 20 MG tablet; Take 1 tablet (20 mg total) by mouth daily with breakfast.  Dispense: 7 tablet; Refill: 0  2. Dysfunction of both eustachian tubes  - Worsening symptoms that have not responded to OTC medications.  - Will give Augmentin and prednisone (for ETD) - Continue allergy medications.  - Steam and humidifier can help - Stay well hydrated and get plenty of rest.  - Seek in person evaluation if no symptom improvement or if symptoms worsen   Follow Up Instructions: I discussed the assessment and treatment plan with the patient. The patient was provided an opportunity to ask  questions and all were answered. The patient agreed with the plan and demonstrated an understanding of the instructions.  A copy of instructions were sent to the patient via MyChart unless otherwise noted below.    The patient was advised to call back or seek an in-person evaluation if the symptoms worsen or if the condition fails to improve as anticipated.  Time:  I spent 10 minutes with the patient via telehealth technology discussing the above problems/concerns.    [name], PA-C

## 2022-01-05 NOTE — Patient Instructions (Signed)
[name], thank you for joining Margaretann Loveless, PA-C for today's virtual visit.  While this provider is not your primary care provider (PCP), if your PCP is located in our provider database this encounter information will be shared with them immediately following your visit.   A [facility] MyChart account gives you access to today's visit and all your visits, tests, and labs performed at [facility] " click here if you don't have a [facility] MyChart account or go to [email or web address]  Consent: (Patient) [name] provided verbal consent for this virtual visit at the beginning of the encounter.  Current Medications:  Current Outpatient Medications:    amoxicillin-clavulanate (AUGMENTIN) 875-125 MG tablet, Take 1 tablet by mouth 2 (two) times daily., Disp: 20 tablet, Rfl: 0   predniSONE (DELTASONE) 20 MG tablet, Take 1 tablet (20 mg total) by mouth daily with breakfast., Disp: 7 tablet, Rfl: 0   escitalopram (LEXAPRO) 10 MG tablet, Take 20 mg by mouth daily. , Disp: , Rfl:    fluticasone (FLONASE) 50 MCG/ACT nasal spray, Place 2 sprays into both nostrils daily., Disp: 16 g, Rfl: 5   LORazepam (ATIVAN) 0.5 MG tablet, Take 0.5 mg by mouth daily as needed. For anxiety, Disp: , Rfl:    nitrofurantoin, macrocrystal-monohydrate, (MACROBID) 100 MG capsule, Take 1 capsule (100 mg total) by mouth 2 (two) times daily., Disp: 14 capsule, Rfl: 0   phenazopyridine (PYRIDIUM) 100 MG tablet, Take 1 tablet (100 mg total) by mouth 3 (three) times daily as needed for pain., Disp: 10 tablet, Rfl: 0   Medications ordered in this encounter:  Meds ordered this encounter  Medications   amoxicillin-clavulanate (AUGMENTIN) 875-125 MG tablet    Sig: Take 1 tablet by mouth 2 (two) times daily.    Dispense:  20 tablet    Refill:  0    Order Specific Question:   Supervising Provider    Answer:   Merrilee Jansky [[id number]]   predniSONE (DELTASONE) 20 MG tablet    Sig: Take 1  tablet (20 mg total) by mouth daily with breakfast.    Dispense:  7 tablet    Refill:  0    Order Specific Question:   Supervising Provider    Answer:   Merrilee Jansky [[id number]]     *If you need refills on other medications prior to your next appointment, please contact your pharmacy*  Follow-Up: Call back or seek an in-person evaluation if the symptoms worsen or if the condition fails to improve as anticipated.  [facility] [phone]  Other Instructions Sinus Infection, Adult A sinus infection, also called sinusitis, is inflammation of your sinuses. Sinuses are hollow spaces in the bones around your face. Your sinuses are located: Around your eyes. In the middle of your forehead. Behind your nose. In your cheekbones. Mucus normally drains out of your sinuses. When your nasal tissues become inflamed or swollen, mucus can become trapped or blocked. This allows bacteria, viruses, and fungi to grow, which leads to infection. Most infections of the sinuses are caused by a virus. A sinus infection can develop quickly. It can last for up to 4 weeks (acute) or for more than 12 weeks (chronic). A sinus infection often develops after a cold. What are the causes? This condition is caused by anything that creates swelling in the sinuses or stops mucus from draining. This includes: Allergies. Asthma. Infection from bacteria or viruses. Deformities or blockages in your nose or sinuses. Abnormal  growths in the nose (nasal polyps). Pollutants, such as chemicals or irritants in the air. Infection from fungi. This is rare. What increases the risk? You are more likely to develop this condition if you: Have a weak body defense system (immune system). Do a lot of swimming or diving. Overuse nasal sprays. Smoke. What are the signs or symptoms? The main symptoms of this condition are pain and a feeling of pressure around the affected sinuses. Other symptoms include: Stuffy nose  or congestion that makes it difficult to breathe through your nose. Thick yellow or greenish drainage from your nose. Tenderness, swelling, and warmth over the affected sinuses. A cough that may get worse at night. Decreased sense of smell and taste. Extra mucus that collects in the throat or the back of the nose (postnasal drip) causing a sore throat or bad breath. Tiredness (fatigue). Fever. How is this diagnosed? This condition is diagnosed based on: Your symptoms. Your medical history. A physical exam. Tests to find out if your condition is acute or chronic. This may include: Checking your nose for nasal polyps. Viewing your sinuses using a device that has a light (endoscope). Testing for allergies or bacteria. Imaging tests, such as an MRI or CT scan. In rare cases, a bone biopsy may be done to rule out more serious types of fungal sinus disease. How is this treated? Treatment for a sinus infection depends on the cause and whether your condition is chronic or acute. If caused by a virus, your symptoms should go away on their own within 10 days. You may be given medicines to relieve symptoms. They include: Medicines that shrink swollen nasal passages (decongestants). A spray that eases inflammation of the nostrils (topical intranasal corticosteroids). Rinses that help get rid of thick mucus in your nose (nasal saline washes). Medicines that treat allergies (antihistamines). Over-the-counter pain relievers. If caused by bacteria, your health care provider may recommend waiting to see if your symptoms improve. Most bacterial infections will get better without antibiotic medicine. You may be given antibiotics if you have: A severe infection. A weak immune system. If caused by narrow nasal passages or nasal polyps, surgery may be needed. Follow these instructions at home: Medicines Take, use, or apply over-the-counter and prescription medicines only as told by your health care  provider. These may include nasal sprays. If you were prescribed an antibiotic medicine, take it as told by your health care provider. Do not stop taking the antibiotic even if you start to feel better. Hydrate and humidify  Drink enough fluid to keep your urine pale yellow. Staying hydrated will help to thin your mucus. Use a cool mist humidifier to keep the humidity level in your home above 50%. Inhale steam for 10-15 minutes, 3-4 times a day, or as told by your health care provider. You can do this in the bathroom while a hot shower is running. Limit your exposure to cool or dry air. Rest Rest as much as possible. Sleep with your head raised (elevated). Make sure you get enough sleep each night. General instructions  Apply a warm, moist washcloth to your face 3-4 times a day or as told by your health care provider. This will help with discomfort. Use nasal saline washes as often as told by your health care provider. Wash your hands often with soap and water to reduce your exposure to germs. If soap and water are not available, use hand sanitizer. Do not smoke. Avoid being around people who are smoking (secondhand smoke).  Keep all follow-up visits. This is important. Contact a health care provider if: You have a fever. Your symptoms get worse. Your symptoms do not improve within 10 days. Get help right away if: You have a severe headache. You have persistent vomiting. You have severe pain or swelling around your face or eyes. You have vision problems. You develop confusion. Your neck is stiff. You have trouble breathing. These symptoms may be an emergency. Get help right away. Call 911. Do not wait to see if the symptoms will go away. Do not drive yourself to the hospital. Summary A sinus infection is soreness and inflammation of your sinuses. Sinuses are hollow spaces in the bones around your face. This condition is caused by nasal tissues that become inflamed or swollen. The  swelling traps or blocks the flow of mucus. This allows bacteria, viruses, and fungi to grow, which leads to infection. If you were prescribed an antibiotic medicine, take it as told by your health care provider. Do not stop taking the antibiotic even if you start to feel better. Keep all follow-up visits. This is important. This information is not intended to replace advice given to you by your health care provider. Make sure you discuss any questions you have with your health care provider. Document Revised: 01/28/2021 Document Reviewed: 01/28/2021 Elsevier Patient Education  [place].    If you have been instructed to have an in-person evaluation today at a local Urgent Care facility, please use the link below. It will take you to a list of all of our available [facility], including address, phone number and hours of operation. Please do not delay care.  [facility]  If you or a family member do not have a primary care provider, use the link below to schedule a visit and establish care. When you choose a [facility] primary care physician or advanced practice provider, you gain a long-term partner in health. Find a Primary Care Provider  Learn more about [facility]'s in-office and virtual care options: [facility]

## 2022-02-19 ENCOUNTER — Other Ambulatory Visit: Payer: Self-pay | Admitting: Obstetrics and Gynecology

## 2022-02-19 DIAGNOSIS — Z1501 Genetic susceptibility to malignant neoplasm of breast: Secondary | ICD-10-CM

## 2022-02-20 ENCOUNTER — Other Ambulatory Visit: Payer: BC Managed Care – PPO

## 2022-02-20 DIAGNOSIS — R7303 Prediabetes: Secondary | ICD-10-CM | POA: Diagnosis not present

## 2022-02-20 DIAGNOSIS — Z0000 Encounter for general adult medical examination without abnormal findings: Secondary | ICD-10-CM | POA: Diagnosis not present

## 2022-02-20 DIAGNOSIS — Z13220 Encounter for screening for lipoid disorders: Secondary | ICD-10-CM | POA: Diagnosis not present

## 2022-03-07 ENCOUNTER — Other Ambulatory Visit: Payer: BC Managed Care – PPO

## 2022-03-11 DIAGNOSIS — E669 Obesity, unspecified: Secondary | ICD-10-CM | POA: Diagnosis not present

## 2022-03-11 DIAGNOSIS — F988 Other specified behavioral and emotional disorders with onset usually occurring in childhood and adolescence: Secondary | ICD-10-CM | POA: Diagnosis not present

## 2022-03-13 ENCOUNTER — Telehealth: Payer: BC Managed Care – PPO

## 2022-03-29 ENCOUNTER — Telehealth: Payer: BC Managed Care – PPO | Admitting: Nurse Practitioner

## 2022-03-29 DIAGNOSIS — K529 Noninfective gastroenteritis and colitis, unspecified: Secondary | ICD-10-CM

## 2022-03-29 MED ORDER — LOPERAMIDE HCL 2 MG PO TABS: 2.0000 mg | ORAL_TABLET | Freq: Four times a day (QID) | ORAL | 0 refills | Status: DC | PRN

## 2022-03-29 MED ORDER — PROMETHAZINE HCL 25 MG PO TABS: 12.0000 mg | ORAL_TABLET | Freq: Three times a day (TID) | ORAL | 0 refills | Status: DC | PRN

## 2022-03-29 NOTE — Patient Instructions (Signed)
  [name], thank you for joining Bridget Pounds, NP for today's virtual visit.  While this provider is not your primary care provider (PCP), if your PCP is located in our provider database this encounter information will be shared with them immediately following your visit.   [facility] account gives you access to today's visit and all your visits, tests, and labs performed at [facility] " click here if you don't have a [facility] account or go to [email or web address]  Consent: (Patient) [name] provided verbal consent for this virtual visit at the beginning of the encounter.  Current Medications:  Current Outpatient Medications:    loperamide (IMODIUM A-D) 2 MG tablet, Take 1 tablet (2 mg total) by mouth 4 (four) times daily as needed for diarrhea or loose stools., Disp: 28 tablet, Rfl: 0   promethazine (PHENERGAN) 25 MG tablet, Take 0.5-1 tablets (12.5-25 mg total) by mouth every 8 (eight) hours as needed for nausea or vomiting., Disp: 21 tablet, Rfl: 0   escitalopram (LEXAPRO) 10 MG tablet, Take 20 mg by mouth daily. , Disp: , Rfl:    fluticasone (FLONASE) 50 MCG/ACT nasal spray, Place 2 sprays into both nostrils daily., Disp: 16 g, Rfl: 5   LORazepam (ATIVAN) 0.5 MG tablet, Take 0.5 mg by mouth daily as needed. For anxiety, Disp: , Rfl:    Medications ordered in this encounter:  Meds ordered this encounter  Medications   promethazine (PHENERGAN) 25 MG tablet    Sig: Take 0.5-1 tablets (12.5-25 mg total) by mouth every 8 (eight) hours as needed for nausea or vomiting.    Dispense:  21 tablet    Refill:  0    Order Specific Question:   Supervising Provider    Answer:   [name] [id number]   loperamide (IMODIUM A-D) 2 MG tablet    Sig: Take 1 tablet (2 mg total) by mouth 4 (four) times daily as needed for diarrhea or loose stools.    Dispense:  28 tablet    Refill:  0    Order Specific Question:   Supervising Provider     Answer:   [name] [id number]     *If you need refills on other medications prior to your next appointment, please contact your pharmacy*  Follow-Up: Call back or seek an in-person evaluation if the symptoms worsen or if the condition fails to improve as anticipated.  [facility] [phone]  Other Instructions BRAT DIET   If you have been instructed to have an in-person evaluation today at a local Urgent Care facility, please use the link below. It will take you to a list of all of our available [facility], including address, phone number and hours of operation. Please do not delay care.  [facility]  If you or a family member do not have a primary care provider, use the link below to schedule a visit and establish care. When you choose a [facility] primary care physician or advanced practice provider, you gain a long-term partner in health. Find a Primary Care Provider  Learn more about 's in-office and virtual care options: [facility]

## 2022-03-29 NOTE — Progress Notes (Signed)
Virtual Visit Consent   [name], you are scheduled for a virtual visit with a [facility] provider today. Just as with appointments in the office, your consent must be obtained to participate. Your consent will be active for this visit and any virtual visit you may have with one of our providers in the next 365 days. If you have a MyChart account, a copy of this consent can be sent to you electronically.  As this is a virtual visit, video technology does not allow for your provider to perform a traditional examination. This may limit your provider's ability to fully assess your condition. If your provider identifies any concerns that need to be evaluated in person or the need to arrange testing (such as labs, EKG, etc.), we will make arrangements to do so. Although advances in technology are sophisticated, we cannot ensure that it will always work on either your end or our end. If the connection with a video visit is poor, the visit may have to be switched to a telephone visit. With either a video or telephone visit, we are not always able to ensure that we have a secure connection.  By engaging in this virtual visit, you consent to the provision of healthcare and authorize for your insurance to be billed (if applicable) for the services provided during this visit. Depending on your insurance coverage, you may receive a charge related to this service.  I need to obtain your verbal consent now. Are you willing to proceed with your visit today? [name] has provided verbal consent on 03/29/2022 for a virtual visit (video or telephone). [name], NP  Date: 03/29/2022 12:05 PM  Virtual Visit via Video Note   I, [name], connected with  [name]  ([id number], March 05, 1976) on 03/29/22 at 12:00 PM EST by a video-enabled telemedicine application and verified that I am speaking with the correct person using two identifiers.  Location: Patient: Virtual Visit Location  Patient: Home Provider: Virtual Visit Location Provider: Home Office   I discussed the limitations of evaluation and management by telemedicine and the availability of in person appointments. The patient expressed understanding and agreed to proceed.    History of Present Illness: [name] is a 47 y.o. who identifies as a female who was assigned female at birth, and is being seen today for gastroenteritis.    Symptoms  of nausea, vomiting, diarrhea and headache have been present for approximately  48  hours. The symptoms are unchanged. Stool frequency is approximately several  timesper day.  She denies the following symptoms: fecal incontinence, fever, and melena. Has been able to drink liquid IV today. She states she can not take zofran and is requesting phenergan instead.    Problems:  Patient Active Problem List   Diagnosis Date Noted   Menstrual migraine with status migrainosus, not intractable 01/22/2021   Prediabetes 01/25/2018   Allergic rhinitis 01/24/2018   Generalized anxiety disorder with panic attacks 01/24/2018   Morbid obesity with BMI of 45.0-49.9, adult ([place]) 01/24/2018   BRCA2 gene mutation positive 12/09/2017   Morbid obesity ([place]) 07/17/2017   Abdominal pain 03/28/2012   Epigastric pain 02/25/2012   Abdominal pain, LLQ 02/16/2011   Nausea & vomiting 02/16/2011   Rectal bleed 02/16/2011   ALT (SGPT) level raised 02/16/2011   Chills    ABDOMINAL PAIN, LEFT UPPER QUADRANT 10/10/2007   BIPOLAR DISORDER UNSPECIFIED 10/07/2007   GERD 10/07/2007   DIARRHEA, CHRONIC 10/07/2007  Allergies:  Allergies  Allergen Reactions   Keflex [Cephalexin]     Throat swelling, face itching and redness   Percocet [Oxycodone-Acetaminophen] Nausea And Vomiting    Only in high doses   Metoclopramide Hcl Palpitations    tachycardia   Medications:  Current Outpatient Medications:    loperamide (IMODIUM A-D) 2 MG tablet, Take 1 tablet (2 mg total) by mouth 4 (four) times  daily as needed for diarrhea or loose stools., Disp: 28 tablet, Rfl: 0   promethazine (PHENERGAN) 25 MG tablet, Take 0.5-1 tablets (12.5-25 mg total) by mouth every 8 (eight) hours as needed for nausea or vomiting., Disp: 21 tablet, Rfl: 0   escitalopram (LEXAPRO) 10 MG tablet, Take 20 mg by mouth daily. , Disp: , Rfl:    fluticasone (FLONASE) 50 MCG/ACT nasal spray, Place 2 sprays into both nostrils daily., Disp: 16 g, Rfl: 5   LORazepam (ATIVAN) 0.5 MG tablet, Take 0.5 mg by mouth daily as needed. For anxiety, Disp: , Rfl:   Observations/Objective: Patient is well-developed, well-nourished in no acute distress.  Resting comfortably  at home.  Head is normocephalic, atraumatic.  No labored breathing.  Speech is clear and coherent with logical content.  Patient is alert and oriented at baseline.    Assessment and Plan: 1. Gastroenteritis - promethazine (PHENERGAN) 25 MG tablet; Take 0.5-1 tablets (12.5-25 mg total) by mouth every 8 (eight) hours as needed for nausea or vomiting.  Dispense: 21 tablet; Refill: 0 - loperamide (IMODIUM A-D) 2 MG tablet; Take 1 tablet (2 mg total) by mouth 4 (four) times daily as needed for diarrhea or loose stools.  Dispense: 28 tablet; Refill: 0 BRAT DIET  Follow Up Instructions: I discussed the assessment and treatment plan with the patient. The patient was provided an opportunity to ask questions and all were answered. The patient agreed with the plan and demonstrated an understanding of the instructions.  A copy of instructions were sent to the patient via MyChart unless otherwise noted below.    The patient was advised to call back or seek an in-person evaluation if the symptoms worsen or if the condition fails to improve as anticipated.  Time:  I spent 11 minutes with the patient via telehealth technology discussing the above problems/concerns.    [name], NP

## 2022-03-31 DIAGNOSIS — R059 Cough, unspecified: Secondary | ICD-10-CM | POA: Diagnosis not present

## 2022-03-31 DIAGNOSIS — R52 Pain, unspecified: Secondary | ICD-10-CM | POA: Diagnosis not present

## 2022-04-02 ENCOUNTER — Other Ambulatory Visit (HOSPITAL_COMMUNITY): Payer: Self-pay

## 2022-04-02 MED ORDER — LISDEXAMFETAMINE DIMESYLATE 20 MG PO CAPS: 20.0000 mg | ORAL_CAPSULE | Freq: Every day | ORAL | 0 refills | Status: DC

## 2022-04-02 MED ORDER — LISDEXAMFETAMINE DIMESYLATE 20 MG PO CAPS
20.0000 mg | ORAL_CAPSULE | Freq: Every day | ORAL | 0 refills | Status: DC
  Filled 2022-04-02: qty 30, 30d supply, fill #0

## 2022-04-03 ENCOUNTER — Other Ambulatory Visit (HOSPITAL_COMMUNITY): Payer: Self-pay

## 2022-05-01 ENCOUNTER — Telehealth: Payer: BC Managed Care – PPO | Admitting: Physician Assistant

## 2022-05-01 DIAGNOSIS — B9689 Other specified bacterial agents as the cause of diseases classified elsewhere: Secondary | ICD-10-CM | POA: Diagnosis not present

## 2022-05-01 DIAGNOSIS — J019 Acute sinusitis, unspecified: Secondary | ICD-10-CM | POA: Diagnosis not present

## 2022-05-01 MED ORDER — DOXYCYCLINE HYCLATE 100 MG PO TABS: 100.0000 mg | ORAL_TABLET | Freq: Two times a day (BID) | ORAL | 0 refills | Status: DC

## 2022-05-01 MED ORDER — PREDNISONE 20 MG PO TABS: 20.0000 mg | ORAL_TABLET | Freq: Every day | ORAL | 0 refills | Status: DC

## 2022-05-01 NOTE — Progress Notes (Signed)
Virtual Visit Consent   [name], you are scheduled for a virtual visit with a [facility] provider today. Just as with appointments in the office, your consent must be obtained to participate. Your consent will be active for this visit and any virtual visit you may have with one of our providers in the next 365 days. If you have a MyChart account, a copy of this consent can be sent to you electronically.  As this is a virtual visit, video technology does not allow for your provider to perform a traditional examination. This may limit your provider's ability to fully assess your condition. If your provider identifies any concerns that need to be evaluated in person or the need to arrange testing (such as labs, EKG, etc.), we will make arrangements to do so. Although advances in technology are sophisticated, we cannot ensure that it will always work on either your end or our end. If the connection with a video visit is poor, the visit may have to be switched to a telephone visit. With either a video or telephone visit, we are not always able to ensure that we have a secure connection.  By engaging in this virtual visit, you consent to the provision of healthcare and authorize for your insurance to be billed (if applicable) for the services provided during this visit. Depending on your insurance coverage, you may receive a charge related to this service.  I need to obtain your verbal consent now. Are you willing to proceed with your visit today? [name] has provided verbal consent on 05/01/2022 for a virtual visit (video or telephone). [name], PA-C  Date: 05/01/2022 1:08 PM  Virtual Visit via Video Note   I, [name], connected with  [name]  ([id number], 1976-02-25) on 05/01/22 at  1:00 PM EST by a video-enabled telemedicine application and verified that I am speaking with the correct person using two identifiers.  Location: Patient: Virtual Visit  Location Patient: Home Provider: Virtual Visit Location Provider: Home Office   I discussed the limitations of evaluation and management by telemedicine and the availability of in person appointments. The patient expressed understanding and agreed to proceed.    History of Present Illness: [name] is a 47 y.o. who identifies as a female who was assigned female at birth, and is being seen today for possible sinus infection.  HPI: Sinusitis This is a new problem. The current episode started 1 to 4 weeks ago (3 weeks: 1st week had runny nose, sneezing, has been outside since her daughter plays softball; over the last 2 weeks has been treating and has not had any improvement). The problem has been gradually worsening since onset. There has been no fever. The pain is moderate. Associated symptoms include congestion, coughing, ear pain, headaches, sinus pressure and sneezing. Pertinent negatives include no chills, diaphoresis, hoarse voice, shortness of breath, sore throat or swollen glands. (Vertigo with ear fullness, post nasal drainage with foul taste, chest starting to feel tighter) Past treatments include oral decongestants (sudafed, Mucinex d, Mucinex, Flonase, Afrin, Vicks). The treatment provided no relief.  Negative at home Covid 19 test x 2   Problems:  Patient Active Problem List   Diagnosis Date Noted   Menstrual migraine with status migrainosus, not intractable 01/22/2021   Prediabetes 01/25/2018   Allergic rhinitis 01/24/2018   Generalized anxiety disorder with panic attacks 01/24/2018   Morbid obesity with BMI of 45.0-49.9, adult ([place]) 01/24/2018   BRCA2 gene  mutation positive 12/09/2017   Morbid obesity ([place]) 07/17/2017   Abdominal pain 03/28/2012   Epigastric pain 02/25/2012   Abdominal pain, LLQ 02/16/2011   Nausea & vomiting 02/16/2011   Rectal bleed 02/16/2011   ALT (SGPT) level raised 02/16/2011   Chills    ABDOMINAL PAIN, LEFT UPPER QUADRANT 10/10/2007    BIPOLAR DISORDER UNSPECIFIED 10/07/2007   GERD 10/07/2007   DIARRHEA, CHRONIC 10/07/2007    Allergies:  Allergies  Allergen Reactions   Keflex [Cephalexin]     Throat swelling, face itching and redness   Percocet [Oxycodone-Acetaminophen] Nausea And Vomiting    Only in high doses   Metoclopramide Hcl Palpitations    tachycardia   Medications:  Current Outpatient Medications:    doxycycline (VIBRA-TABS) 100 MG tablet, Take 1 tablet (100 mg total) by mouth 2 (two) times daily., Disp: 20 tablet, Rfl: 0   predniSONE (DELTASONE) 20 MG tablet, Take 1 tablet (20 mg total) by mouth daily with breakfast., Disp: 5 tablet, Rfl: 0   escitalopram (LEXAPRO) 10 MG tablet, Take 20 mg by mouth daily. , Disp: , Rfl:    fluticasone (FLONASE) 50 MCG/ACT nasal spray, Place 2 sprays into both nostrils daily., Disp: 16 g, Rfl: 5   loperamide (IMODIUM A-D) 2 MG tablet, Take 1 tablet (2 mg total) by mouth 4 (four) times daily as needed for diarrhea or loose stools., Disp: 28 tablet, Rfl: 0   LORazepam (ATIVAN) 0.5 MG tablet, Take 0.5 mg by mouth daily as needed. For anxiety, Disp: , Rfl:    promethazine (PHENERGAN) 25 MG tablet, Take 0.5-1 tablets (12.5-25 mg total) by mouth every 8 (eight) hours as needed for nausea or vomiting., Disp: 21 tablet, Rfl: 0  Observations/Objective: Patient is well-developed, well-nourished in no acute distress.  Resting comfortably at home.  Head is normocephalic, atraumatic.  No labored breathing.  Speech is clear and coherent with logical content.  Patient is alert and oriented at baseline.    Assessment and Plan: 1. Acute bacterial sinusitis - predniSONE (DELTASONE) 20 MG tablet; Take 1 tablet (20 mg total) by mouth daily with breakfast.  Dispense: 5 tablet; Refill: 0 - doxycycline (VIBRA-TABS) 100 MG tablet; Take 1 tablet (100 mg total) by mouth 2 (two) times daily.  Dispense: 20 tablet; Refill: 0  - Worsening symptoms that have not responded to OTC medications.  -  Will give Doxycycline and Prednisone - Continue allergy medications.  - Steam and humidifier can help - Stay well hydrated and get plenty of rest.  - Seek in person evaluation if no symptom improvement or if symptoms worsen   Follow Up Instructions: I discussed the assessment and treatment plan with the patient. The patient was provided an opportunity to ask questions and all were answered. The patient agreed with the plan and demonstrated an understanding of the instructions.  A copy of instructions were sent to the patient via MyChart unless otherwise noted below.    The patient was advised to call back or seek an in-person evaluation if the symptoms worsen or if the condition fails to improve as anticipated.  Time:  I spent 10 minutes with the patient via telehealth technology discussing the above problems/concerns.    [name], PA-C

## 2022-05-01 NOTE — Patient Instructions (Signed)
[name], thank you for joining [name], PA-C for today's virtual visit.  While this provider is not your primary care provider (PCP), if your PCP is located in our provider database this encounter information will be shared with them immediately following your visit.   [facility] account gives you access to today's visit and all your visits, tests, and labs performed at [facility] " click here if you don't have a [facility] account or go to [email or web address]  Consent: (Patient) [name] provided verbal consent for this virtual visit at the beginning of the encounter.  Current Medications:  Current Outpatient Medications:    doxycycline (VIBRA-TABS) 100 MG tablet, Take 1 tablet (100 mg total) by mouth 2 (two) times daily., Disp: 20 tablet, Rfl: 0   predniSONE (DELTASONE) 20 MG tablet, Take 1 tablet (20 mg total) by mouth daily with breakfast., Disp: 5 tablet, Rfl: 0   escitalopram (LEXAPRO) 10 MG tablet, Take 20 mg by mouth daily. , Disp: , Rfl:    fluticasone (FLONASE) 50 MCG/ACT nasal spray, Place 2 sprays into both nostrils daily., Disp: 16 g, Rfl: 5   loperamide (IMODIUM A-D) 2 MG tablet, Take 1 tablet (2 mg total) by mouth 4 (four) times daily as needed for diarrhea or loose stools., Disp: 28 tablet, Rfl: 0   LORazepam (ATIVAN) 0.5 MG tablet, Take 0.5 mg by mouth daily as needed. For anxiety, Disp: , Rfl:    promethazine (PHENERGAN) 25 MG tablet, Take 0.5-1 tablets (12.5-25 mg total) by mouth every 8 (eight) hours as needed for nausea or vomiting., Disp: 21 tablet, Rfl: 0   Medications ordered in this encounter:  Meds ordered this encounter  Medications   predniSONE (DELTASONE) 20 MG tablet    Sig: Take 1 tablet (20 mg total) by mouth daily with breakfast.    Dispense:  5 tablet    Refill:  0    Order Specific Question:   Supervising Provider    Answer:   Chase Picket [id number]   doxycycline (VIBRA-TABS) 100  MG tablet    Sig: Take 1 tablet (100 mg total) by mouth 2 (two) times daily.    Dispense:  20 tablet    Refill:  0    Order Specific Question:   Supervising Provider    Answer:   Chase Picket [id number]     *If you need refills on other medications prior to your next appointment, please contact your pharmacy*  Follow-Up: Call back or seek an in-person evaluation if the symptoms worsen or if the condition fails to improve as anticipated.  [facility] [phone]  Other Instructions  Sinus Infection, Adult A sinus infection, also called sinusitis, is inflammation of your sinuses. Sinuses are hollow spaces in the bones around your face. Your sinuses are located: Around your eyes. In the middle of your forehead. Behind your nose. In your cheekbones. Mucus normally drains out of your sinuses. When your nasal tissues become inflamed or swollen, mucus can become trapped or blocked. This allows bacteria, viruses, and fungi to grow, which leads to infection. Most infections of the sinuses are caused by a virus. A sinus infection can develop quickly. It can last for up to 4 weeks (acute) or for more than 12 weeks (chronic). A sinus infection often develops after a cold. What are the causes? This condition is caused by anything that creates swelling in the sinuses or stops mucus from draining. This includes:  Allergies. Asthma. Infection from bacteria or viruses. Deformities or blockages in your nose or sinuses. Abnormal growths in the nose (nasal polyps). Pollutants, such as chemicals or irritants in the air. Infection from fungi. This is rare. What increases the risk? You are more likely to develop this condition if you: Have a weak body defense system (immune system). Do a lot of swimming or diving. Overuse nasal sprays. Smoke. What are the signs or symptoms? The main symptoms of this condition are pain and a feeling of pressure around the affected sinuses. Other  symptoms include: Stuffy nose or congestion that makes it difficult to breathe through your nose. Thick yellow or greenish drainage from your nose. Tenderness, swelling, and warmth over the affected sinuses. A cough that may get worse at night. Decreased sense of smell and taste. Extra mucus that collects in the throat or the back of the nose (postnasal drip) causing a sore throat or bad breath. Tiredness (fatigue). Fever. How is this diagnosed? This condition is diagnosed based on: Your symptoms. Your medical history. A physical exam. Tests to find out if your condition is acute or chronic. This may include: Checking your nose for nasal polyps. Viewing your sinuses using a device that has a light (endoscope). Testing for allergies or bacteria. Imaging tests, such as an MRI or CT scan. In rare cases, a bone biopsy may be done to rule out more serious types of fungal sinus disease. How is this treated? Treatment for a sinus infection depends on the cause and whether your condition is chronic or acute. If caused by a virus, your symptoms should go away on their own within 10 days. You may be given medicines to relieve symptoms. They include: Medicines that shrink swollen nasal passages (decongestants). A spray that eases inflammation of the nostrils (topical intranasal corticosteroids). Rinses that help get rid of thick mucus in your nose (nasal saline washes). Medicines that treat allergies (antihistamines). Over-the-counter pain relievers. If caused by bacteria, your health care provider may recommend waiting to see if your symptoms improve. Most bacterial infections will get better without antibiotic medicine. You may be given antibiotics if you have: A severe infection. A weak immune system. If caused by narrow nasal passages or nasal polyps, surgery may be needed. Follow these instructions at home: Medicines Take, use, or apply over-the-counter and prescription medicines only as  told by your health care provider. These may include nasal sprays. If you were prescribed an antibiotic medicine, take it as told by your health care provider. Do not stop taking the antibiotic even if you start to feel better. Hydrate and humidify  Drink enough fluid to keep your urine pale yellow. Staying hydrated will help to thin your mucus. Use a cool mist humidifier to keep the humidity level in your home above 50%. Inhale steam for 10-15 minutes, 3-4 times a day, or as told by your health care provider. You can do this in the bathroom while a hot shower is running. Limit your exposure to cool or dry air. Rest Rest as much as possible. Sleep with your head raised (elevated). Make sure you get enough sleep each night. General instructions  Apply a warm, moist washcloth to your face 3-4 times a day or as told by your health care provider. This will help with discomfort. Use nasal saline washes as often as told by your health care provider. Wash your hands often with soap and water to reduce your exposure to germs. If soap and water are not  available, use hand sanitizer. Do not smoke. Avoid being around people who are smoking (secondhand smoke). Keep all follow-up visits. This is important. Contact a health care provider if: You have a fever. Your symptoms get worse. Your symptoms do not improve within 10 days. Get help right away if: You have a severe headache. You have persistent vomiting. You have severe pain or swelling around your face or eyes. You have vision problems. You develop confusion. Your neck is stiff. You have trouble breathing. These symptoms may be an emergency. Get help right away. Call 911. Do not wait to see if the symptoms will go away. Do not drive yourself to the hospital. Summary A sinus infection is soreness and inflammation of your sinuses. Sinuses are hollow spaces in the bones around your face. This condition is caused by nasal tissues that become  inflamed or swollen. The swelling traps or blocks the flow of mucus. This allows bacteria, viruses, and fungi to grow, which leads to infection. If you were prescribed an antibiotic medicine, take it as told by your health care provider. Do not stop taking the antibiotic even if you start to feel better. Keep all follow-up visits. This is important. This information is not intended to replace advice given to you by your health care provider. Make sure you discuss any questions you have with your health care provider. Document Revised: 01/28/2021 Document Reviewed: 01/28/2021 Elsevier Patient Education  [place].    If you have been instructed to have an in-person evaluation today at a local Urgent Care facility, please use the link below. It will take you to a list of all of our available [facility], including address, phone number and hours of operation. Please do not delay care.  [facility]  If you or a family member do not have a primary care provider, use the link below to schedule a visit and establish care. When you choose a [facility] primary care physician or advanced practice provider, you gain a long-term partner in health. Find a Primary Care Provider  Learn more about [facility]'s in-office and virtual care options: [facility]

## 2022-07-06 DIAGNOSIS — R300 Dysuria: Secondary | ICD-10-CM | POA: Diagnosis not present

## 2022-07-09 DIAGNOSIS — E669 Obesity, unspecified: Secondary | ICD-10-CM | POA: Diagnosis not present

## 2022-07-09 DIAGNOSIS — F411 Generalized anxiety disorder: Secondary | ICD-10-CM | POA: Diagnosis not present

## 2022-07-09 DIAGNOSIS — F410 Panic disorder [episodic paroxysmal anxiety] without agoraphobia: Secondary | ICD-10-CM | POA: Diagnosis not present

## 2022-07-09 DIAGNOSIS — F988 Other specified behavioral and emotional disorders with onset usually occurring in childhood and adolescence: Secondary | ICD-10-CM | POA: Diagnosis not present

## 2022-07-15 ENCOUNTER — Other Ambulatory Visit: Payer: BC Managed Care – PPO

## 2022-08-15 ENCOUNTER — Other Ambulatory Visit: Payer: BC Managed Care – PPO

## 2022-09-29 DIAGNOSIS — F988 Other specified behavioral and emotional disorders with onset usually occurring in childhood and adolescence: Secondary | ICD-10-CM | POA: Diagnosis not present

## 2022-09-29 DIAGNOSIS — F411 Generalized anxiety disorder: Secondary | ICD-10-CM | POA: Diagnosis not present

## 2022-09-29 DIAGNOSIS — L700 Acne vulgaris: Secondary | ICD-10-CM | POA: Diagnosis not present

## 2022-09-29 DIAGNOSIS — F410 Panic disorder [episodic paroxysmal anxiety] without agoraphobia: Secondary | ICD-10-CM | POA: Diagnosis not present

## 2022-10-17 ENCOUNTER — Other Ambulatory Visit: Payer: BC Managed Care – PPO

## 2022-10-30 ENCOUNTER — Telehealth: Payer: BC Managed Care – PPO | Admitting: Physician Assistant

## 2022-10-30 DIAGNOSIS — R3989 Other symptoms and signs involving the genitourinary system: Secondary | ICD-10-CM | POA: Diagnosis not present

## 2022-10-30 MED ORDER — PHENAZOPYRIDINE HCL 100 MG PO TABS: 100.0000 mg | ORAL_TABLET | Freq: Three times a day (TID) | ORAL | 0 refills | Status: DC | PRN

## 2022-10-30 MED ORDER — SULFAMETHOXAZOLE-TRIMETHOPRIM 800-160 MG PO TABS: 1.0000 | ORAL_TABLET | Freq: Two times a day (BID) | ORAL | 0 refills | Status: DC

## 2022-10-30 NOTE — Progress Notes (Signed)
Virtual Visit Consent   [name], you are scheduled for a virtual visit with a [facility] provider today. Just as with appointments in the office, your consent must be obtained to participate. Your consent will be active for this visit and any virtual visit you may have with one of our providers in the next 365 days. If you have a MyChart account, a copy of this consent can be sent to you electronically.  As this is a virtual visit, video technology does not allow for your provider to perform a traditional examination. This may limit your provider's ability to fully assess your condition. If your provider identifies any concerns that need to be evaluated in person or the need to arrange testing (such as labs, EKG, etc.), we will make arrangements to do so. Although advances in technology are sophisticated, we cannot ensure that it will always work on either your end or our end. If the connection with a video visit is poor, the visit may have to be switched to a telephone visit. With either a video or telephone visit, we are not always able to ensure that we have a secure connection.  By engaging in this virtual visit, you consent to the provision of healthcare and authorize for your insurance to be billed (if applicable) for the services provided during this visit. Depending on your insurance coverage, you may receive a charge related to this service.  I need to obtain your verbal consent now. Are you willing to proceed with your visit today? [name] has provided verbal consent on 10/30/2022 for a virtual visit (video or telephone). Margaretann Loveless, PA-C  Date: 10/30/2022 11:56 AM  Virtual Visit via Video Note   I, Margaretann Loveless, connected with  [name]  ([id number], 01/16/1976) on 10/30/22 at 11:45 AM EDT by a video-enabled telemedicine application and verified that I am speaking with the correct person using two identifiers.  Location: Patient: Virtual Visit  Location Patient: Home Provider: Virtual Visit Location Provider: Home Office   I discussed the limitations of evaluation and management by telemedicine and the availability of in person appointments. The patient expressed understanding and agreed to proceed.    History of Present Illness: [name] is a 47 y.o. who identifies as a female who was assigned female at birth, and is being seen today for possible UTI.  HPI: Urinary Tract Infection  This is a new problem. The current episode started in the past 7 days (couple days ago). The problem occurs every urination. The problem has been gradually worsening. The quality of the pain is described as aching and burning. There has been no fever. Associated symptoms include frequency, hesitancy and urgency. Pertinent negatives include no chills, flank pain, hematuria, nausea or vomiting. Associated symptoms comments: Suprapubic pain. She has tried increased fluids (AZO) for the symptoms. The treatment provided no relief. Her past medical history is significant for recurrent UTIs.      Problems:  Patient Active Problem List   Diagnosis Date Noted   Menstrual migraine with status migrainosus, not intractable 01/22/2021   Prediabetes 01/25/2018   Allergic rhinitis 01/24/2018   Generalized anxiety disorder with panic attacks 01/24/2018   Morbid obesity with BMI of 45.0-49.9, adult (HCC) 01/24/2018   BRCA2 gene mutation positive 12/09/2017   Morbid obesity (HCC) 07/17/2017   Abdominal pain 03/28/2012   Epigastric pain 02/25/2012   Abdominal pain, LLQ 02/16/2011   Nausea & vomiting 02/16/2011   Rectal bleed 02/16/2011  ALT (SGPT) level raised 02/16/2011   Chills    ABDOMINAL PAIN, LEFT UPPER QUADRANT 10/10/2007   BIPOLAR DISORDER UNSPECIFIED 10/07/2007   GERD 10/07/2007   DIARRHEA, CHRONIC 10/07/2007    Allergies:  Allergies  Allergen Reactions   Keflex [Cephalexin]     Throat swelling, face itching and redness   Percocet  [Oxycodone-Acetaminophen] Nausea And Vomiting    Only in high doses   Metoclopramide Hcl Palpitations    tachycardia   Medications:  Current Outpatient Medications:    phenazopyridine (PYRIDIUM) 100 MG tablet, Take 1 tablet (100 mg total) by mouth 3 (three) times daily as needed for pain., Disp: 10 tablet, Rfl: 0   sulfamethoxazole-trimethoprim (BACTRIM DS) 800-160 MG tablet, Take 1 tablet by mouth 2 (two) times daily., Disp: 10 tablet, Rfl: 0   escitalopram (LEXAPRO) 10 MG tablet, Take 20 mg by mouth daily. , Disp: , Rfl:    fluticasone (FLONASE) 50 MCG/ACT nasal spray, Place 2 sprays into both nostrils daily., Disp: 16 g, Rfl: 5   loperamide (IMODIUM A-D) 2 MG tablet, Take 1 tablet (2 mg total) by mouth 4 (four) times daily as needed for diarrhea or loose stools., Disp: 28 tablet, Rfl: 0   LORazepam (ATIVAN) 0.5 MG tablet, Take 0.5 mg by mouth daily as needed. For anxiety, Disp: , Rfl:    predniSONE (DELTASONE) 20 MG tablet, Take 1 tablet (20 mg total) by mouth daily with breakfast., Disp: 5 tablet, Rfl: 0   promethazine (PHENERGAN) 25 MG tablet, Take 0.5-1 tablets (12.5-25 mg total) by mouth every 8 (eight) hours as needed for nausea or vomiting., Disp: 21 tablet, Rfl: 0  Observations/Objective: Patient is well-developed, well-nourished in no acute distress.  Resting comfortably at home.  Head is normocephalic, atraumatic.  No labored breathing.  Speech is clear and coherent with logical content.  Patient is alert and oriented at baseline.    Assessment and Plan: 1. Suspected UTI - sulfamethoxazole-trimethoprim (BACTRIM DS) 800-160 MG tablet; Take 1 tablet by mouth 2 (two) times daily.  Dispense: 10 tablet; Refill: 0 - phenazopyridine (PYRIDIUM) 100 MG tablet; Take 1 tablet (100 mg total) by mouth 3 (three) times daily as needed for pain.  Dispense: 10 tablet; Refill: 0  - Worsening symptoms.  - Will treat empirically with Bactrim - May use Pyridium for bladder spasms - Continue  to push fluids.  - Seek in person evaluation for urine culture if symptoms do not improve or if they worsen.    Follow Up Instructions: I discussed the assessment and treatment plan with the patient. The patient was provided an opportunity to ask questions and all were answered. The patient agreed with the plan and demonstrated an understanding of the instructions.  A copy of instructions were sent to the patient via MyChart unless otherwise noted below.    The patient was advised to call back or seek an in-person evaluation if the symptoms worsen or if the condition fails to improve as anticipated.  Time:  I spent 10 minutes with the patient via telehealth technology discussing the above problems/concerns.    Margaretann Loveless, PA-C

## 2022-10-30 NOTE — Patient Instructions (Signed)
[name], thank you for joining Margaretann Loveless, PA-C for today's virtual visit.  While this provider is not your primary care provider (PCP), if your PCP is located in our provider database this encounter information will be shared with them immediately following your visit.   A [facility] MyChart account gives you access to today's visit and all your visits, tests, and labs performed at [facility] " click here if you don't have a  MyChart account or go to [email or web address]  Consent: (Patient) [name] provided verbal consent for this virtual visit at the beginning of the encounter.  Current Medications:  Current Outpatient Medications:    phenazopyridine (PYRIDIUM) 100 MG tablet, Take 1 tablet (100 mg total) by mouth 3 (three) times daily as needed for pain., Disp: 10 tablet, Rfl: 0   sulfamethoxazole-trimethoprim (BACTRIM DS) 800-160 MG tablet, Take 1 tablet by mouth 2 (two) times daily., Disp: 10 tablet, Rfl: 0   escitalopram (LEXAPRO) 10 MG tablet, Take 20 mg by mouth daily. , Disp: , Rfl:    fluticasone (FLONASE) 50 MCG/ACT nasal spray, Place 2 sprays into both nostrils daily., Disp: 16 g, Rfl: 5   loperamide (IMODIUM A-D) 2 MG tablet, Take 1 tablet (2 mg total) by mouth 4 (four) times daily as needed for diarrhea or loose stools., Disp: 28 tablet, Rfl: 0   LORazepam (ATIVAN) 0.5 MG tablet, Take 0.5 mg by mouth daily as needed. For anxiety, Disp: , Rfl:    predniSONE (DELTASONE) 20 MG tablet, Take 1 tablet (20 mg total) by mouth daily with breakfast., Disp: 5 tablet, Rfl: 0   promethazine (PHENERGAN) 25 MG tablet, Take 0.5-1 tablets (12.5-25 mg total) by mouth every 8 (eight) hours as needed for nausea or vomiting., Disp: 21 tablet, Rfl: 0   Medications ordered in this encounter:  Meds ordered this encounter  Medications   sulfamethoxazole-trimethoprim (BACTRIM DS) 800-160 MG tablet    Sig: Take 1 tablet by mouth 2 (two) times daily.     Dispense:  10 tablet    Refill:  0    Order Specific Question:   Supervising Provider    Answer:   Merrilee Jansky [id number]   phenazopyridine (PYRIDIUM) 100 MG tablet    Sig: Take 1 tablet (100 mg total) by mouth 3 (three) times daily as needed for pain.    Dispense:  10 tablet    Refill:  0    Order Specific Question:   Supervising Provider    Answer:   Merrilee Jansky [id number]     *If you need refills on other medications prior to your next appointment, please contact your pharmacy*  Follow-Up: Call back or seek an in-person evaluation if the symptoms worsen or if the condition fails to improve as anticipated.   Virtual Care [phone]  Other Instructions Urinary Tract Infection, Adult  A urinary tract infection (UTI) is an infection of any part of the urinary tract. The urinary tract includes the kidneys, ureters, bladder, and urethra. These organs make, store, and get rid of urine in the body. An upper UTI affects the ureters and kidneys. A lower UTI affects the bladder and urethra. What are the causes? Most urinary tract infections are caused by bacteria in your genital area around your urethra, where urine leaves your body. These bacteria grow and cause inflammation of your urinary tract. What increases the risk? You are more likely to develop this condition if: You have a urinary catheter that  stays in place. You are not able to control when you urinate or have a bowel movement (incontinence). You are female and you: Use a spermicide or diaphragm for birth control. Have low estrogen levels. Are pregnant. You have certain genes that increase your risk. You are sexually active. You take antibiotic medicines. You have a condition that causes your flow of urine to slow down, such as: An enlarged prostate, if you are female. Blockage in your urethra. A kidney stone. A nerve condition that affects your bladder control (neurogenic bladder). Not getting  enough to drink, or not urinating often. You have certain medical conditions, such as: Diabetes. A weak disease-fighting system (immunesystem). Sickle cell disease. Gout. Spinal cord injury. What are the signs or symptoms? Symptoms of this condition include: Needing to urinate right away (urgency). Frequent urination. This may include small amounts of urine each time you urinate. Pain or burning with urination. Blood in the urine. Urine that smells bad or unusual. Trouble urinating. Cloudy urine. Vaginal discharge, if you are female. Pain in the abdomen or the lower back. You may also have: Vomiting or a decreased appetite. Confusion. Irritability or tiredness. A fever or chills. Diarrhea. The first symptom in older adults may be confusion. In some cases, they may not have any symptoms until the infection has worsened. How is this diagnosed? This condition is diagnosed based on your medical history and a physical exam. You may also have other tests, including: Urine tests. Blood tests. Tests for STIs (sexually transmitted infections). If you have had more than one UTI, a cystoscopy or imaging studies may be done to determine the cause of the infections. How is this treated? Treatment for this condition includes: Antibiotic medicine. Over-the-counter medicines to treat discomfort. Drinking enough water to stay hydrated. If you have frequent infections or have other conditions such as a kidney stone, you may need to see a health care provider who specializes in the urinary tract (urologist). In rare cases, urinary tract infections can cause sepsis. Sepsis is a life-threatening condition that occurs when the body responds to an infection. Sepsis is treated in the hospital with IV antibiotics, fluids, and other medicines. Follow these instructions at home:  Medicines Take over-the-counter and prescription medicines only as told by your health care provider. If you were  prescribed an antibiotic medicine, take it as told by your health care provider. Do not stop using the antibiotic even if you start to feel better. General instructions Make sure you: Empty your bladder often and completely. Do not hold urine for long periods of time. Empty your bladder after sex. Wipe from front to back after urinating or having a bowel movement if you are female. Use each tissue only one time when you wipe. Drink enough fluid to keep your urine pale yellow. Keep all follow-up visits. This is important. Contact a health care provider if: Your symptoms do not get better after 1-2 days. Your symptoms go away and then return. Get help right away if: You have severe pain in your back or your lower abdomen. You have a fever or chills. You have nausea or vomiting. Summary A urinary tract infection (UTI) is an infection of any part of the urinary tract, which includes the kidneys, ureters, bladder, and urethra. Most urinary tract infections are caused by bacteria in your genital area. Treatment for this condition often includes antibiotic medicines. If you were prescribed an antibiotic medicine, take it as told by your health care provider. Do not stop using  the antibiotic even if you start to feel better. Keep all follow-up visits. This is important. This information is not intended to replace advice given to you by your health care provider. Make sure you discuss any questions you have with your health care provider. Document Revised: 10/01/2019 Document Reviewed: 10/06/2019 Elsevier Patient Education  2024 Elsevier Inc.    If you have been instructed to have an in-person evaluation today at a local Urgent Care facility, please use the link below. It will take you to a list of all of our available [facility], including address, phone number and hours of operation. Please do not delay care.  [facility]  If you or a family member do not have a  primary care provider, use the link below to schedule a visit and establish care. When you choose a [facility] primary care physician or advanced practice provider, you gain a long-term partner in health. Find a Primary Care Provider  Learn more about [facility] in-office and virtual care options: [facility] - Get Care Now

## 2022-11-07 ENCOUNTER — Telehealth: Payer: BC Managed Care – PPO | Admitting: Family Medicine

## 2022-11-07 DIAGNOSIS — J301 Allergic rhinitis due to pollen: Secondary | ICD-10-CM | POA: Diagnosis not present

## 2022-11-07 DIAGNOSIS — J019 Acute sinusitis, unspecified: Secondary | ICD-10-CM | POA: Diagnosis not present

## 2022-11-07 DIAGNOSIS — B9689 Other specified bacterial agents as the cause of diseases classified elsewhere: Secondary | ICD-10-CM | POA: Diagnosis not present

## 2022-11-07 MED ORDER — PREDNISONE 20 MG PO TABS: 20.0000 mg | ORAL_TABLET | Freq: Two times a day (BID) | ORAL | 0 refills | Status: AC

## 2022-11-07 MED ORDER — AMOXICILLIN-POT CLAVULANATE 875-125 MG PO TABS: 1.0000 | ORAL_TABLET | Freq: Two times a day (BID) | ORAL | 0 refills | Status: DC

## 2022-11-07 MED ORDER — FLUTICASONE PROPIONATE 50 MCG/ACT NA SUSP: 2.0000 | Freq: Every day | NASAL | 0 refills | Status: DC

## 2022-11-07 NOTE — Progress Notes (Signed)
Virtual Visit Consent   [name], you are scheduled for a virtual visit with a [facility] provider today. Just as with appointments in the office, your consent must be obtained to participate. Your consent will be active for this visit and any virtual visit you may have with one of our providers in the next 365 days. If you have a MyChart account, a copy of this consent can be sent to you electronically.  As this is a virtual visit, video technology does not allow for your provider to perform a traditional examination. This may limit your provider's ability to fully assess your condition. If your provider identifies any concerns that need to be evaluated in person or the need to arrange testing (such as labs, EKG, etc.), we will make arrangements to do so. Although advances in technology are sophisticated, we cannot ensure that it will always work on either your end or our end. If the connection with a video visit is poor, the visit may have to be switched to a telephone visit. With either a video or telephone visit, we are not always able to ensure that we have a secure connection.  By engaging in this virtual visit, you consent to the provision of healthcare and authorize for your insurance to be billed (if applicable) for the services provided during this visit. Depending on your insurance coverage, you may receive a charge related to this service.  I need to obtain your verbal consent now. Are you willing to proceed with your visit today? [name] has provided verbal consent on 11/07/2022 for a virtual visit (video or telephone). Bridget Curio, FNP  Date: 11/07/2022 10:56 AM  Virtual Visit via Video Note   I, [name], connected with  [name]  ([id number], [birth year]) on 11/07/22 at 10:45 AM EDT by a video-enabled telemedicine application and verified that I am speaking with the correct person using two identifiers.  Location: Patient: Virtual Visit Location Patient:  Home Provider: Virtual Visit Location Provider: Home Office   I discussed the limitations of evaluation and management by telemedicine and the availability of in person appointments. The patient expressed understanding and agreed to proceed.    History of Present Illness: [name] is a 47 y.o. who identifies as a female who was assigned female at birth, and is being seen today for sinu pain and pressure, post nasal drainage, headaches, fever, cough at times. Allergies started problems and sx have been present for 2 weeks worsening. She was just treated for a UTI and that has resolved. Marland Kitchen  HPI: HPI  Problems:  Patient Active Problem List   Diagnosis Date Noted   Menstrual migraine with status migrainosus, not intractable 01/22/2021   Prediabetes 01/25/2018   Allergic rhinitis 01/24/2018   Generalized anxiety disorder with panic attacks 01/24/2018   Morbid obesity with BMI of 45.0-49.9, adult (HCC) 01/24/2018   BRCA2 gene mutation positive 12/09/2017   Morbid obesity (HCC) 07/17/2017   Abdominal pain 03/28/2012   Epigastric pain 02/25/2012   Abdominal pain, LLQ 02/16/2011   Nausea & vomiting 02/16/2011   Rectal bleed 02/16/2011   ALT (SGPT) level raised 02/16/2011   Chills    ABDOMINAL PAIN, LEFT UPPER QUADRANT 10/10/2007   BIPOLAR DISORDER UNSPECIFIED 10/07/2007   GERD 10/07/2007   DIARRHEA, CHRONIC 10/07/2007    Allergies:  Allergies  Allergen Reactions   Keflex [Cephalexin]     Throat swelling, face itching and redness   Percocet [Oxycodone-Acetaminophen] Nausea And Vomiting  Only in high doses   Metoclopramide Hcl Palpitations    tachycardia   Medications:  Current Outpatient Medications:    amoxicillin-clavulanate (AUGMENTIN) 875-125 MG tablet, Take 1 tablet by mouth 2 (two) times daily., Disp: 20 tablet, Rfl: 0   predniSONE (DELTASONE) 20 MG tablet, Take 1 tablet (20 mg total) by mouth 2 (two) times daily with a meal for 5 days., Disp: 10 tablet, Rfl: 0    escitalopram (LEXAPRO) 10 MG tablet, Take 20 mg by mouth daily. , Disp: , Rfl:    fluticasone (FLONASE) 50 MCG/ACT nasal spray, Place 2 sprays into both nostrils daily., Disp: 16 g, Rfl: 0   loperamide (IMODIUM A-D) 2 MG tablet, Take 1 tablet (2 mg total) by mouth 4 (four) times daily as needed for diarrhea or loose stools., Disp: 28 tablet, Rfl: 0   LORazepam (ATIVAN) 0.5 MG tablet, Take 0.5 mg by mouth daily as needed. For anxiety, Disp: , Rfl:    phenazopyridine (PYRIDIUM) 100 MG tablet, Take 1 tablet (100 mg total) by mouth 3 (three) times daily as needed for pain., Disp: 10 tablet, Rfl: 0   predniSONE (DELTASONE) 20 MG tablet, Take 1 tablet (20 mg total) by mouth daily with breakfast., Disp: 5 tablet, Rfl: 0   promethazine (PHENERGAN) 25 MG tablet, Take 0.5-1 tablets (12.5-25 mg total) by mouth every 8 (eight) hours as needed for nausea or vomiting., Disp: 21 tablet, Rfl: 0   sulfamethoxazole-trimethoprim (BACTRIM DS) 800-160 MG tablet, Take 1 tablet by mouth 2 (two) times daily., Disp: 10 tablet, Rfl: 0  Observations/Objective: Patient is well-developed, well-nourished in no acute distress.  Resting comfortably  at home.  Head is normocephalic, atraumatic.  No labored breathing.  Speech is clear and coherent with logical content.  Patient is alert and oriented at baseline.    Assessment and Plan: 1. Acute bacterial sinusitis  2. Allergic rhinitis due to pollen, unspecified seasonality  Increase fluids, resume flonase, start allegra, UC if sx worsen.   Follow Up Instructions: I discussed the assessment and treatment plan with the patient. The patient was provided an opportunity to ask questions and all were answered. The patient agreed with the plan and demonstrated an understanding of the instructions.  A copy of instructions were sent to the patient via MyChart unless otherwise noted below.     The patient was advised to call back or seek an in-person evaluation if the symptoms  worsen or if the condition fails to improve as anticipated.  Time:  I spent 10 minutes with the patient via telehealth technology discussing the above problems/concerns.    Bridget Curio, FNP

## 2022-11-07 NOTE — Patient Instructions (Signed)

## 2023-01-03 ENCOUNTER — Telehealth: Payer: BC Managed Care – PPO

## 2023-01-21 DIAGNOSIS — J0100 Acute maxillary sinusitis, unspecified: Secondary | ICD-10-CM | POA: Diagnosis not present

## 2023-01-24 ENCOUNTER — Telehealth: Payer: BC Managed Care – PPO | Admitting: Family Medicine

## 2023-01-24 DIAGNOSIS — H1131 Conjunctival hemorrhage, right eye: Secondary | ICD-10-CM | POA: Diagnosis not present

## 2023-01-24 NOTE — Patient Instructions (Signed)
 Subconjunctival Hemorrhage Subconjunctival hemorrhage is bleeding that happens between the white part of your eye (sclera) and the clear membrane that covers the outside of your eye (conjunctiva). There are many tiny blood vessels near the surface of your eye. A subconjunctival hemorrhage happens when one or more of these vessels breaks and bleeds, causing a red patch to appear on your eye. This is similar to a bruise. Depending on the amount of bleeding, the red patch may only cover a small area of your eye or it may cover the entire visible part of the sclera. If a lot of blood collects under the conjunctiva, there may also be swelling. Subconjunctival hemorrhages do not affect your vision or cause pain, but your eye may feel irritated if there is swelling. Subconjunctival hemorrhages usually do not require treatment, and they usually disappear on their own within two to four weeks. What are the causes? This condition may be caused by: Mild trauma, such as rubbing your eye too hard. Blunt injuries, such as from playing sports or coming into contact with a deployed airbag. Coughing, sneezing, or vomiting. Straining, such as when lifting a heavy object. Medical conditions, such as: High blood pressure. Diabetes. Recent eye surgery. Certain medicines, especially blood thinners (anticoagulants), including aspirin. Other conditions, such as eye tumors, bleeding disorders, or blood vessel abnormalities. Subconjunctival hemorrhages can also happen without an obvious cause. What are the signs or symptoms? Symptoms of this condition include: A bright red or dark red patch on the white part of the eye. The red area may: Spread out to cover a larger area of the eye before it goes away. Turn colors such as pink or brownish-yellow before it goes away. Swelling around the eye. Mild eye irritation. How is this diagnosed? This condition is diagnosed with a physical exam. If your subconjunctival hemorrhage  was caused by trauma, your health care provider may refer you to an eye specialist (ophthalmologist) or another specialist to check for other injuries. You may have other tests, including: An eye exam including a vision test, checking your eye with a type of microscope (slit lamp) and measuring the pressure in your eye. Your eye may be dilated, especially if your subconjunctival hemorrhage was caused by trauma. A blood pressure check. Blood tests to check for bleeding disorders. If your subconjunctival hemorrhage was caused by trauma, X-rays or a CT scan may be done to check for other injuries. How is this treated? Usually, treatment is not needed for this condition. If you have discomfort, your health care provider may recommend eye drops or cold compresses. Follow these instructions at home: Take over-the-counter and prescription medicines only as directed by your health care provider. Use eye drops or cold compresses to help with discomfort as directed by your health care provider. Avoid activities, things, and environments that may irritate or injure your eye. Keep all follow-up visits. This is important. Contact a health care provider if: You have pain in your eye. The bleeding does not go away within 4 weeks. You keep getting new subconjunctival hemorrhages. Get help right away if: Your vision changes, you have difficulty seeing, or you develop double vision. You suddenly develop severe sensitivity to light. You develop a severe headache, persistent vomiting, confusion, or abnormal tiredness (lethargy). Your eye seems to bulge or protrude from your eye socket. You develop unexplained bruises on your body. You have unexplained bleeding in another area of your body. These symptoms may represent a serious problem that is an emergency. Do not  wait to see if the symptoms will go away. Get medical help right away. Call your local emergency services (911 in the [place] Do not drive yourself to  the hospital. Summary Subconjunctival hemorrhage is bleeding that happens between the white part of your eye and the clear membrane that covers the outside of your eye. This condition is similar to a bruise. Subconjunctival hemorrhages usually do not require treatment, and they usually disappear on their own within two to four weeks. Use eye drops or cold compresses to help with discomfort as directed by your health care provider. This information is not intended to replace advice given to you by your health care provider. Make sure you discuss any questions you have with your health care provider. Document Revised: 04/29/2020 Document Reviewed: 05/01/2020 Elsevier Patient Education  2024 [employer].

## 2023-01-24 NOTE — Progress Notes (Signed)
Virtual Visit Consent   [name], you are scheduled for a virtual visit with a [name] provider today. Just as with appointments in the office, your consent must be obtained to participate. Your consent will be active for this visit and any virtual visit you may have with one of our providers in the next 365 days. If you have a MyChart account, a copy of this consent can be sent to you electronically.  As this is a virtual visit, video technology does not allow for your provider to perform a traditional examination. This may limit your provider's ability to fully assess your condition. If your provider identifies any concerns that need to be evaluated in person or the need to arrange testing (such as labs, EKG, etc.), we will make arrangements to do so. Although advances in technology are sophisticated, we cannot ensure that it will always work on either your end or our end. If the connection with a video visit is poor, the visit may have to be switched to a telephone visit. With either a video or telephone visit, we are not always able to ensure that we have a secure connection.  By engaging in this virtual visit, you consent to the provision of healthcare and authorize for your insurance to be billed (if applicable) for the services provided during this visit. Depending on your insurance coverage, you may receive a charge related to this service.  I need to obtain your verbal consent now. Are you willing to proceed with your visit today? [name] has provided verbal consent on 01/24/2023 for a virtual visit (video or telephone). Bridget Curio, FNP  Date: 01/24/2023 3:02 PM  Virtual Visit via Video Note   I, [name], connected with  [name]  ([id number], 05-17-1975) on 01/24/23 at  3:00 PM EST by a video-enabled telemedicine application and verified that I am speaking with the correct person using two identifiers.  Location: Patient: Virtual Visit Location Patient:  Home Provider: Virtual Visit Location Provider: Home Office   I discussed the limitations of evaluation and management by telemedicine and the availability of in person appointments. The patient expressed understanding and agreed to proceed.    History of Present Illness: [name] is a 47 y.o. who identifies as a female who was assigned female at birth, and is being seen today for redness on eye with no pain, drainage ir irritation. Woke like this today. No fever. No vision changes. Marland Kitchen  HPI: HPI  Problems:  Patient Active Problem List   Diagnosis Date Noted   Menstrual migraine with status migrainosus, not intractable 01/22/2021   Prediabetes 01/25/2018   Allergic rhinitis 01/24/2018   Generalized anxiety disorder with panic attacks 01/24/2018   Morbid obesity with BMI of 45.0-49.9, adult (HCC) 01/24/2018   BRCA2 gene mutation positive 12/09/2017   Morbid obesity (HCC) 07/17/2017   Abdominal pain 03/28/2012   Epigastric pain 02/25/2012   Abdominal pain, LLQ 02/16/2011   Nausea & vomiting 02/16/2011   Rectal bleed 02/16/2011   ALT (SGPT) level raised 02/16/2011   Chills    ABDOMINAL PAIN, LEFT UPPER QUADRANT 10/10/2007   BIPOLAR DISORDER UNSPECIFIED 10/07/2007   GERD 10/07/2007   DIARRHEA, CHRONIC 10/07/2007    Allergies:  Allergies  Allergen Reactions   Keflex [Cephalexin]     Throat swelling, face itching and redness   Percocet [Oxycodone-Acetaminophen] Nausea And Vomiting    Only in high doses   Metoclopramide Hcl Palpitations    tachycardia  Medications:  Current Outpatient Medications:    amoxicillin-clavulanate (AUGMENTIN) 875-125 MG tablet, Take 1 tablet by mouth 2 (two) times daily., Disp: 20 tablet, Rfl: 0   escitalopram (LEXAPRO) 10 MG tablet, Take 20 mg by mouth daily. , Disp: , Rfl:    fluticasone (FLONASE) 50 MCG/ACT nasal spray, Place 2 sprays into both nostrils daily., Disp: 16 g, Rfl: 0   loperamide (IMODIUM A-D) 2 MG tablet, Take 1 tablet (2 mg  total) by mouth 4 (four) times daily as needed for diarrhea or loose stools., Disp: 28 tablet, Rfl: 0   LORazepam (ATIVAN) 0.5 MG tablet, Take 0.5 mg by mouth daily as needed. For anxiety, Disp: , Rfl:    phenazopyridine (PYRIDIUM) 100 MG tablet, Take 1 tablet (100 mg total) by mouth 3 (three) times daily as needed for pain., Disp: 10 tablet, Rfl: 0   predniSONE (DELTASONE) 20 MG tablet, Take 1 tablet (20 mg total) by mouth daily with breakfast., Disp: 5 tablet, Rfl: 0   promethazine (PHENERGAN) 25 MG tablet, Take 0.5-1 tablets (12.5-25 mg total) by mouth every 8 (eight) hours as needed for nausea or vomiting., Disp: 21 tablet, Rfl: 0   sulfamethoxazole-trimethoprim (BACTRIM DS) 800-160 MG tablet, Take 1 tablet by mouth 2 (two) times daily., Disp: 10 tablet, Rfl: 0  Observations/Objective: Patient is well-developed, well-nourished in no acute distress.  Resting comfortably  at home.  Head is normocephalic, atraumatic.  No labored breathing.  Speech is clear and coherent with logical content.  Patient is alert and oriented at baseline.    Assessment and Plan: 1. Subconjunctival hemorrhage of right eye  Disease process discussed, UC as needed.   Follow Up Instructions: I discussed the assessment and treatment plan with the patient. The patient was provided an opportunity to ask questions and all were answered. The patient agreed with the plan and demonstrated an understanding of the instructions.  A copy of instructions were sent to the patient via MyChart unless otherwise noted below.     The patient was advised to call back or seek an in-person evaluation if the symptoms worsen or if the condition fails to improve as anticipated.    Bridget Curio, FNP

## 2023-01-27 DIAGNOSIS — H1131 Conjunctival hemorrhage, right eye: Secondary | ICD-10-CM | POA: Diagnosis not present

## 2023-02-23 ENCOUNTER — Telehealth: Payer: BC Managed Care – PPO | Admitting: Physician Assistant

## 2023-02-23 DIAGNOSIS — J019 Acute sinusitis, unspecified: Secondary | ICD-10-CM | POA: Diagnosis not present

## 2023-02-23 DIAGNOSIS — B9689 Other specified bacterial agents as the cause of diseases classified elsewhere: Secondary | ICD-10-CM

## 2023-02-23 MED ORDER — AZITHROMYCIN 250 MG PO TABS: ORAL_TABLET | ORAL | 0 refills | Status: AC

## 2023-02-23 MED ORDER — BENZONATATE 100 MG PO CAPS: 100.0000 mg | ORAL_CAPSULE | Freq: Three times a day (TID) | ORAL | 0 refills | Status: DC | PRN

## 2023-02-23 NOTE — Addendum Note (Signed)
Addended by: Margaretann Loveless on: 02/23/2023 03:47 PM   Modules accepted: Orders

## 2023-02-23 NOTE — Progress Notes (Signed)
E-Visit for Sinus Problems  We are sorry that you are not feeling well.  Here is how we plan to help!  Based on what you have shared with me it looks like you have sinusitis.  Sinusitis is inflammation and infection in the sinus cavities of the head.  Based on your presentation I believe you most likely have Acute Bacterial Sinusitis.  This is an infection caused by bacteria and is treated with antibiotics. I have prescribed Azithromycin to take as directed. You may use an oral decongestant such as Mucinex D or if you have glaucoma or high blood pressure use plain Mucinex. Saline nasal spray help and can safely be used as often as needed for congestion.  If you develop worsening sinus pain, fever or notice severe headache and vision changes, or if symptoms are not better after completion of antibiotic, please schedule an appointment with a health care provider.    Sinus infections are not as easily transmitted as other respiratory infection, however we still recommend that you avoid close contact with loved ones, especially the very young and elderly.  Remember to wash your hands thoroughly throughout the day as this is the number one way to prevent the spread of infection!  Home Care: Only take medications as instructed by your medical team. Complete the entire course of an antibiotic. Do not take these medications with alcohol. A steam or ultrasonic humidifier can help congestion.  You can place a towel over your head and breathe in the steam from hot water coming from a faucet. Avoid close contacts especially the very young and the elderly. Cover your mouth when you cough or sneeze. Always remember to wash your hands.  Get Help Right Away If: You develop worsening fever or sinus pain. You develop a severe head ache or visual changes. Your symptoms persist after you have completed your treatment plan.  Make sure you Understand these instructions. Will watch your condition. Will get help  right away if you are not doing well or get worse.  Thank you for choosing an e-visit.  Your e-visit answers were reviewed by a board certified advanced clinical practitioner to complete your personal care plan. Depending upon the condition, your plan could have included both over the counter or prescription medications.  Please review your pharmacy choice. Make sure the pharmacy is open so you can pick up prescription now. If there is a problem, you may contact your provider through MyChart messaging and have the prescription routed to another pharmacy.  Your safety is important to us. If you have drug allergies check your prescription carefully.   For the next 24 hours you can use MyChart to ask questions about today's visit, request a non-urgent call back, or ask for a work or school excuse. You will get an email in the next two days asking about your experience. I hope that your e-visit has been valuable and will speed your recovery.  

## 2023-02-23 NOTE — Progress Notes (Signed)
I have spent 5 minutes in review of e-visit questionnaire, review and updating patient chart, medical decision making and response to patient.   Mia Milan Cody Jacklynn Dehaas, PA-C    

## 2023-03-09 ENCOUNTER — Other Ambulatory Visit: Payer: Self-pay | Admitting: Obstetrics and Gynecology

## 2023-03-09 DIAGNOSIS — Z1501 Genetic susceptibility to malignant neoplasm of breast: Secondary | ICD-10-CM

## 2023-03-25 DIAGNOSIS — Z124 Encounter for screening for malignant neoplasm of cervix: Secondary | ICD-10-CM | POA: Diagnosis not present

## 2023-03-25 DIAGNOSIS — Z1321 Encounter for screening for nutritional disorder: Secondary | ICD-10-CM | POA: Diagnosis not present

## 2023-03-25 DIAGNOSIS — E7800 Pure hypercholesterolemia, unspecified: Secondary | ICD-10-CM | POA: Diagnosis not present

## 2023-03-25 DIAGNOSIS — Z13228 Encounter for screening for other metabolic disorders: Secondary | ICD-10-CM | POA: Diagnosis not present

## 2023-03-25 DIAGNOSIS — Z6838 Body mass index (BMI) 38.0-38.9, adult: Secondary | ICD-10-CM | POA: Diagnosis not present

## 2023-03-25 DIAGNOSIS — Z1231 Encounter for screening mammogram for malignant neoplasm of breast: Secondary | ICD-10-CM | POA: Diagnosis not present

## 2023-03-25 DIAGNOSIS — Z01419 Encounter for gynecological examination (general) (routine) without abnormal findings: Secondary | ICD-10-CM | POA: Diagnosis not present

## 2023-03-25 DIAGNOSIS — Z131 Encounter for screening for diabetes mellitus: Secondary | ICD-10-CM | POA: Diagnosis not present

## 2023-03-25 DIAGNOSIS — Z1329 Encounter for screening for other suspected endocrine disorder: Secondary | ICD-10-CM | POA: Diagnosis not present

## 2023-04-03 ENCOUNTER — Ambulatory Visit
Admission: RE | Admit: 2023-04-03 | Discharge: 2023-04-03 | Disposition: A | Discharge status: Home / Self Care | Payer: BC Managed Care – PPO | Source: Ambulatory Visit | Attending: Obstetrics and Gynecology | Admitting: Obstetrics and Gynecology

## 2023-04-03 DIAGNOSIS — Z1239 Encounter for other screening for malignant neoplasm of breast: Secondary | ICD-10-CM | POA: Diagnosis not present

## 2023-04-03 DIAGNOSIS — Z1501 Genetic susceptibility to malignant neoplasm of breast: Secondary | ICD-10-CM

## 2023-04-03 MED ORDER — GADOPICLENOL 0.5 MMOL/ML IV SOLN
10.0000 mL | Freq: Once | INTRAVENOUS | Status: AC | PRN
  Administered 2023-04-03: 10 mL via INTRAVENOUS

## 2023-04-12 ENCOUNTER — Telehealth: Payer: BC Managed Care – PPO | Admitting: Physician Assistant

## 2023-04-12 DIAGNOSIS — J019 Acute sinusitis, unspecified: Secondary | ICD-10-CM

## 2023-04-12 DIAGNOSIS — B9689 Other specified bacterial agents as the cause of diseases classified elsewhere: Secondary | ICD-10-CM | POA: Diagnosis not present

## 2023-04-12 MED ORDER — DOXYCYCLINE HYCLATE 100 MG PO TABS: 100.0000 mg | ORAL_TABLET | Freq: Two times a day (BID) | ORAL | 0 refills | Status: DC

## 2023-04-12 NOTE — Progress Notes (Signed)

## 2023-04-13 DIAGNOSIS — Z853 Personal history of malignant neoplasm of breast: Secondary | ICD-10-CM | POA: Diagnosis not present

## 2023-05-05 DIAGNOSIS — Z803 Family history of malignant neoplasm of breast: Secondary | ICD-10-CM | POA: Diagnosis not present

## 2023-05-05 DIAGNOSIS — Z1509 Genetic susceptibility to other malignant neoplasm: Secondary | ICD-10-CM | POA: Diagnosis not present

## 2023-05-05 DIAGNOSIS — Z1501 Genetic susceptibility to malignant neoplasm of breast: Secondary | ICD-10-CM | POA: Diagnosis not present

## 2023-06-01 DIAGNOSIS — J0101 Acute recurrent maxillary sinusitis: Secondary | ICD-10-CM | POA: Diagnosis not present

## 2023-08-19 ENCOUNTER — Ambulatory Visit

## 2023-08-19 DIAGNOSIS — S0083XA Contusion of other part of head, initial encounter: Secondary | ICD-10-CM | POA: Diagnosis not present

## 2023-08-19 DIAGNOSIS — S060X0A Concussion without loss of consciousness, initial encounter: Secondary | ICD-10-CM | POA: Diagnosis not present

## 2023-08-19 DIAGNOSIS — W19XXXA Unspecified fall, initial encounter: Secondary | ICD-10-CM | POA: Diagnosis not present

## 2023-11-11 DIAGNOSIS — F900 Attention-deficit hyperactivity disorder, predominantly inattentive type: Secondary | ICD-10-CM | POA: Diagnosis not present

## 2023-11-11 DIAGNOSIS — Z6838 Body mass index (BMI) 38.0-38.9, adult: Secondary | ICD-10-CM | POA: Diagnosis not present

## 2023-11-11 DIAGNOSIS — R7303 Prediabetes: Secondary | ICD-10-CM | POA: Diagnosis not present

## 2023-11-11 DIAGNOSIS — F439 Reaction to severe stress, unspecified: Secondary | ICD-10-CM | POA: Diagnosis not present

## 2023-12-03 ENCOUNTER — Telehealth: Admitting: Physician Assistant

## 2023-12-03 DIAGNOSIS — J019 Acute sinusitis, unspecified: Secondary | ICD-10-CM

## 2023-12-03 DIAGNOSIS — B9689 Other specified bacterial agents as the cause of diseases classified elsewhere: Secondary | ICD-10-CM | POA: Diagnosis not present

## 2023-12-03 MED ORDER — FLUTICASONE PROPIONATE 50 MCG/ACT NA SUSP: 2.0000 | Freq: Every day | NASAL | 0 refills | Status: AC

## 2023-12-03 MED ORDER — DOXYCYCLINE HYCLATE 100 MG PO TABS: 100.0000 mg | ORAL_TABLET | Freq: Two times a day (BID) | ORAL | 0 refills | Status: DC

## 2023-12-03 MED ORDER — PREDNISONE 20 MG PO TABS: 40.0000 mg | ORAL_TABLET | Freq: Every day | ORAL | 0 refills | Status: DC

## 2023-12-03 NOTE — Progress Notes (Signed)
 E-Visit for Sinus Problems  We are sorry that you are not feeling well.  Here is how we plan to help!  Based on what you have shared with me it looks like you have sinusitis.  Sinusitis is inflammation and infection in the sinus cavities of the head.  Based on your presentation I believe you most likely have Acute Bacterial Sinusitis.  This is an infection caused by bacteria and is treated with antibiotics. I have prescribed Doxycycline  100mg  by mouth twice a day for 7 days. and I have also prescribed Flonase  Nasal Spray Use 2 sprays in each nostril daily for 10-14 days, and Prednisone  20mg  Take 2 tablets (40mg ) daily for 5 days. You may use an oral decongestant such as Mucinex D or if you have glaucoma or high blood pressure use plain Mucinex. Saline nasal spray help and can safely be used as often as needed for congestion.  If you develop worsening sinus pain, fever or notice severe headache and vision changes, or if symptoms are not better after completion of antibiotic, please schedule an appointment with a health care provider.    Sinus infections are not as easily transmitted as other respiratory infection, however we still recommend that you avoid close contact with loved ones, especially the very young and elderly.  Remember to wash your hands thoroughly throughout the day as this is the number one way to prevent the spread of infection!  Home Care: Only take medications as instructed by your medical team. Complete the entire course of an antibiotic. Do not take these medications with alcohol. A steam or ultrasonic humidifier can help congestion.  You can place a towel over your head and breathe in the steam from hot water  coming from a faucet. Avoid close contacts especially the very young and the elderly. Cover your mouth when you cough or sneeze. Always remember to wash your hands.  Get Help Right Away If: You develop worsening fever or sinus pain. You develop a severe head ache or  visual changes. Your symptoms persist after you have completed your treatment plan.  Make sure you Understand these instructions. Will watch your condition. Will get help right away if you are not doing well or get worse.  Thank you for choosing an e-visit.  Your e-visit answers were reviewed by a board certified advanced clinical practitioner to complete your personal care plan. Depending upon the condition, your plan could have included both over the counter or prescription medications.  Please review your pharmacy choice. Make sure the pharmacy is open so you can pick up prescription now. If there is a problem, you may contact your provider through [employer] and have the prescription routed to another pharmacy.  Your safety is important to us . If you have drug allergies check your prescription carefully.   For the next 24 hours you can use MyChart to ask questions about today's visit, request a non-urgent call back, or ask for a work or school excuse. You will get an email in the next two days asking about your experience. I hope that your e-visit has been valuable and will speed your recovery.    I have spent 5 minutes in review of e-visit questionnaire, review and updating patient chart, medical decision making and response to patient.   Delon CHRISTELLA Dickinson, PA-C

## 2024-01-12 ENCOUNTER — Telehealth: Admitting: Physician Assistant

## 2024-01-12 DIAGNOSIS — J329 Chronic sinusitis, unspecified: Secondary | ICD-10-CM

## 2024-01-13 ENCOUNTER — Telehealth: Admitting: Physician Assistant

## 2024-01-13 DIAGNOSIS — B9689 Other specified bacterial agents as the cause of diseases classified elsewhere: Secondary | ICD-10-CM | POA: Diagnosis not present

## 2024-01-13 DIAGNOSIS — J019 Acute sinusitis, unspecified: Secondary | ICD-10-CM | POA: Diagnosis not present

## 2024-01-13 MED ORDER — AZITHROMYCIN 250 MG PO TABS: ORAL_TABLET | ORAL | 0 refills | Status: AC

## 2024-01-13 MED ORDER — CETIRIZINE HCL 10 MG PO TABS: 10.0000 mg | ORAL_TABLET | Freq: Every day | ORAL | 0 refills | Status: AC

## 2024-01-13 MED ORDER — PREDNISONE 20 MG PO TABS: 40.0000 mg | ORAL_TABLET | Freq: Every day | ORAL | 0 refills | Status: AC

## 2024-01-13 NOTE — Progress Notes (Signed)
  Because you were recently treated for similar issues, I feel your condition warrants further evaluation and I recommend that you be seen in a face-to-face visit.   NOTE: There will be NO CHARGE for this E-Visit   If you are having a true medical emergency, please call 911.     For an urgent face to face visit, [facility] has multiple urgent care centers for your convenience.  Click the link below for the full list of locations and hours, walk-in wait times, appointment scheduling options and driving directions:  Urgent Care - [place], [place], [place], [place], [place], [place]       Your MyChart E-visit questionnaire answers were reviewed by a board certified advanced clinical practitioner to complete your personal care plan based on your specific symptoms.    Thank you for using e-Visits.

## 2024-01-13 NOTE — Progress Notes (Signed)
 Virtual Visit Consent   [name], you are scheduled for a virtual visit with a [facility] provider today. Just as with appointments in the office, your consent must be obtained to participate. Your consent will be active for this visit and any virtual visit you may have with one of our providers in the next 365 days. If you have a MyChart account, a copy of this consent can be sent to you electronically.  As this is a virtual visit, video technology does not allow for your provider to perform a traditional examination. This may limit your provider's ability to fully assess your condition. If your provider identifies any concerns that need to be evaluated in person or the need to arrange testing (such as labs, EKG, etc.), we will make arrangements to do so. Although advances in technology are sophisticated, we cannot ensure that it will always work on either your end or our end. If the connection with a video visit is poor, the visit may have to be switched to a telephone visit. With either a video or telephone visit, we are not always able to ensure that we have a secure connection.  By engaging in this virtual visit, you consent to the provision of healthcare and authorize for your insurance to be billed (if applicable) for the services provided during this visit. Depending on your insurance coverage, you may receive a charge related to this service.  I need to obtain your verbal consent now. Are you willing to proceed with your visit today? Lis Savitt has provided verbal consent on 01/13/2024 for a virtual visit (video or telephone). Delon CHRISTELLA Dickinson, PA-C  Date: 01/13/2024 10:44 AM   Virtual Visit via Video Note   I, Delon CHRISTELLA Dickinson, connected with  Deshunda Thackston  ([id number], 1975-11-24) on 01/13/24 at 10:30 AM EST by a video-enabled telemedicine application and verified that I am speaking with the correct person using two identifiers.  Location: Patient: Virtual Visit  Location Patient: Home Provider: Virtual Visit Location Provider: Home Office   I discussed the limitations of evaluation and management by telemedicine and the availability of in person appointments. The patient expressed understanding and agreed to proceed.    History of Present Illness: [name] is a 48 y.o. who identifies as a female who was assigned female at birth, and is being seen today for recurrent sinus congestion.  HPI: Sinusitis This is a recurrent problem. The current episode started 1 to 4 weeks ago (Just treated on 12/03/23, virtually, with Doxycycline , Prednisone , and Flonase  for same symtpoms; reports flared currently becuase she was given a couch and the person had a cat and she was not aware prior). The problem has been gradually worsening since onset. There has been no fever. The pain is moderate. Associated symptoms include congestion, ear pain (right), headaches and sinus pressure. Pertinent negatives include no chills, coughing, diaphoresis or sore throat. (Face and jaw pain) Treatments tried: mucinex, claritin, mucinex D, flonase . The treatment provided no relief.     Problems:  Patient Active Problem List   Diagnosis Date Noted   Menstrual migraine with status migrainosus, not intractable 01/22/2021   Prediabetes 01/25/2018   Allergic rhinitis 01/24/2018   Generalized anxiety disorder with panic attacks 01/24/2018   Morbid obesity with BMI of 45.0-49.9, adult (HCC) 01/24/2018   BRCA2 gene mutation positive 12/09/2017   Morbid obesity (HCC) 07/17/2017   Abdominal pain 03/28/2012   Epigastric pain 02/25/2012   Abdominal pain, LLQ 02/16/2011  Nausea & vomiting 02/16/2011   Rectal bleed 02/16/2011   ALT (SGPT) level raised 02/16/2011   Chills    ABDOMINAL PAIN, LEFT UPPER QUADRANT 10/10/2007   BIPOLAR DISORDER UNSPECIFIED 10/07/2007   GERD 10/07/2007   DIARRHEA, CHRONIC 10/07/2007    Allergies:  Allergies  Allergen Reactions   Keflex  [Cephalexin ]      Throat swelling, face itching and redness   Percocet [Oxycodone -Acetaminophen ] Nausea And Vomiting    Only in high doses   Metoclopramide Hcl Palpitations    tachycardia   Medications:  Current Outpatient Medications:    azithromycin  (ZITHROMAX ) 250 MG tablet, Take 2 tablets on day 1, then 1 tablet daily on days 2 through 5, Disp: 6 tablet, Rfl: 0   cetirizine (ZYRTEC) 10 MG tablet, Take 1 tablet (10 mg total) by mouth daily., Disp: 90 tablet, Rfl: 0   predniSONE  (DELTASONE ) 20 MG tablet, Take 2 tablets (40 mg total) by mouth daily with breakfast., Disp: 10 tablet, Rfl: 0   escitalopram (LEXAPRO) 10 MG tablet, Take 20 mg by mouth daily. , Disp: , Rfl:    fluticasone  (FLONASE ) 50 MCG/ACT nasal spray, Place 2 sprays into both nostrils daily., Disp: 16 g, Rfl: 0   LORazepam  (ATIVAN ) 0.5 MG tablet, Take 0.5 mg by mouth daily as needed. For anxiety, Disp: , Rfl:   Observations/Objective: Patient is well-developed, well-nourished in no acute distress.  Resting comfortably at home.  Head is normocephalic, atraumatic.  No labored breathing.  Speech is clear and coherent with logical content.  Patient is alert and oriented at baseline.    Assessment and Plan: 1. Acute bacterial sinusitis (Primary) - predniSONE  (DELTASONE ) 20 MG tablet; Take 2 tablets (40 mg total) by mouth daily with breakfast.  Dispense: 10 tablet; Refill: 0 - cetirizine (ZYRTEC) 10 MG tablet; Take 1 tablet (10 mg total) by mouth daily.  Dispense: 90 tablet; Refill: 0 - azithromycin  (ZITHROMAX ) 250 MG tablet; Take 2 tablets on day 1, then 1 tablet daily on days 2 through 5  Dispense: 6 tablet; Refill: 0  - Worsening symptoms that have not responded to OTC medications.  - Will give Azithromycin , Prednisone  - Change Claritin to Cetirizine - Continue allergy medications.  - Steam and humidifier can help - Stay well hydrated and get plenty of rest.  - Seek in person evaluation if no symptom improvement or if symptoms  worsen   Follow Up Instructions: I discussed the assessment and treatment plan with the patient. The patient was provided an opportunity to ask questions and all were answered. The patient agreed with the plan and demonstrated an understanding of the instructions.  A copy of instructions were sent to the patient via MyChart unless otherwise noted below.    The patient was advised to call back or seek an in-person evaluation if the symptoms worsen or if the condition fails to improve as anticipated.    Delon CHRISTELLA Dickinson, PA-C

## 2024-01-13 NOTE — Patient Instructions (Signed)
 Rexene Sammy Seed, thank you for joining Delon CHRISTELLA Dickinson, PA-C for today's virtual visit.  While this provider is not your primary care provider (PCP), if your PCP is located in our provider database this encounter information will be shared with them immediately following your visit.   A [facility] MyChart account gives you access to today's visit and all your visits, tests, and labs performed at [facility]  click here if you don't have a [facility] MyChart account or go to [email or web address]  Consent: (Patient) Rexene Sammy Diamant provided verbal consent for this virtual visit at the beginning of the encounter.  Current Medications:  Current Outpatient Medications:    azithromycin  (ZITHROMAX ) 250 MG tablet, Take 2 tablets on day 1, then 1 tablet daily on days 2 through 5, Disp: 6 tablet, Rfl: 0   cetirizine (ZYRTEC) 10 MG tablet, Take 1 tablet (10 mg total) by mouth daily., Disp: 90 tablet, Rfl: 0   predniSONE  (DELTASONE ) 20 MG tablet, Take 2 tablets (40 mg total) by mouth daily with breakfast., Disp: 10 tablet, Rfl: 0   escitalopram (LEXAPRO) 10 MG tablet, Take 20 mg by mouth daily. , Disp: , Rfl:    fluticasone  (FLONASE ) 50 MCG/ACT nasal spray, Place 2 sprays into both nostrils daily., Disp: 16 g, Rfl: 0   LORazepam  (ATIVAN ) 0.5 MG tablet, Take 0.5 mg by mouth daily as needed. For anxiety, Disp: , Rfl:    Medications ordered in this encounter:  Meds ordered this encounter  Medications   predniSONE  (DELTASONE ) 20 MG tablet    Sig: Take 2 tablets (40 mg total) by mouth daily with breakfast.    Dispense:  10 tablet    Refill:  0    Supervising Provider:   BLAISE ALEENE KIDD [[id number]]   cetirizine (ZYRTEC) 10 MG tablet    Sig: Take 1 tablet (10 mg total) by mouth daily.    Dispense:  90 tablet    Refill:  0    Supervising Provider:   LAMPTEY, PHILIP O [[id number]]   azithromycin  (ZITHROMAX ) 250 MG tablet    Sig: Take 2 tablets on day 1, then 1 tablet daily on days  2 through 5    Dispense:  6 tablet    Refill:  0    Supervising Provider:   LAMPTEY, PHILIP O [phone]     *If you need refills on other medications prior to your next appointment, please contact your pharmacy*  Follow-Up: Call back or seek an in-person evaluation if the symptoms worsen or if the condition fails to improve as anticipated.  [facility] [phone]  Other Instructions Sinus Infection, Adult A sinus infection, also called sinusitis, is inflammation of your sinuses. Sinuses are hollow spaces in the bones around your face. Your sinuses are located: Around your eyes. In the middle of your forehead. Behind your nose. In your cheekbones. Mucus normally drains out of your sinuses. When your nasal tissues become inflamed or swollen, mucus can become trapped or blocked. This allows bacteria, viruses, and fungi to grow, which leads to infection. Most infections of the sinuses are caused by a virus. A sinus infection can develop quickly. It can last for up to 4 weeks (acute) or for more than 12 weeks (chronic). A sinus infection often develops after a cold. What are the causes? This condition is caused by anything that creates swelling in the sinuses or stops mucus from draining. This includes: Allergies. Asthma. Infection from bacteria or viruses. Deformities or blockages  in your nose or sinuses. Abnormal growths in the nose (nasal polyps). Pollutants, such as chemicals or irritants in the air. Infection from fungi. This is rare. What increases the risk? You are more likely to develop this condition if you: Have a weak body defense system (immune system). Do a lot of swimming or diving. Overuse nasal sprays. Smoke. What are the signs or symptoms? The main symptoms of this condition are pain and a feeling of pressure around the affected sinuses. Other symptoms include: Stuffy nose or congestion that makes it difficult to breathe through your nose. Thick  yellow or greenish drainage from your nose. Tenderness, swelling, and warmth over the affected sinuses. A cough that may get worse at night. Decreased sense of smell and taste. Extra mucus that collects in the throat or the back of the nose (postnasal drip) causing a sore throat or bad breath. Tiredness (fatigue). Fever. How is this diagnosed? This condition is diagnosed based on: Your symptoms. Your medical history. A physical exam. Tests to find out if your condition is acute or chronic. This may include: Checking your nose for nasal polyps. Viewing your sinuses using a device that has a light (endoscope). Testing for allergies or bacteria. Imaging tests, such as an MRI or CT scan. In rare cases, a bone biopsy may be done to rule out more serious types of fungal sinus disease. How is this treated? Treatment for a sinus infection depends on the cause and whether your condition is chronic or acute. If caused by a virus, your symptoms should go away on their own within 10 days. You may be given medicines to relieve symptoms. They include: Medicines that shrink swollen nasal passages (decongestants). A spray that eases inflammation of the nostrils (topical intranasal corticosteroids). Rinses that help get rid of thick mucus in your nose (nasal saline washes). Medicines that treat allergies (antihistamines). Over-the-counter pain relievers. If caused by bacteria, your health care provider may recommend waiting to see if your symptoms improve. Most bacterial infections will get better without antibiotic medicine. You may be given antibiotics if you have: A severe infection. A weak immune system. If caused by narrow nasal passages or nasal polyps, surgery may be needed. Follow these instructions at home: Medicines Take, use, or apply over-the-counter and prescription medicines only as told by your health care provider. These may include nasal sprays. If you were prescribed an antibiotic  medicine, take it as told by your health care provider. Do not stop taking the antibiotic even if you start to feel better. Hydrate and humidify  Drink enough fluid to keep your urine pale yellow. Staying hydrated will help to thin your mucus. Use a cool mist humidifier to keep the humidity level in your home above 50%. Inhale steam for 10-15 minutes, 3-4 times a day, or as told by your health care provider. You can do this in the bathroom while a hot shower is running. Limit your exposure to cool or dry air. Rest Rest as much as possible. Sleep with your head raised (elevated). Make sure you get enough sleep each night. General instructions  Apply a warm, moist washcloth to your face 3-4 times a day or as told by your health care provider. This will help with discomfort. Use nasal saline washes as often as told by your health care provider. Wash your hands often with soap and water  to reduce your exposure to germs. If soap and water  are not available, use hand sanitizer. Do not smoke. Avoid being around  people who are smoking (secondhand smoke). Keep all follow-up visits. This is important. Contact a health care provider if: You have a fever. Your symptoms get worse. Your symptoms do not improve within 10 days. Get help right away if: You have a severe headache. You have persistent vomiting. You have severe pain or swelling around your face or eyes. You have vision problems. You develop confusion. Your neck is stiff. You have trouble breathing. These symptoms may be an emergency. Get help right away. Call 911. Do not wait to see if the symptoms will go away. Do not drive yourself to the hospital. Summary A sinus infection is soreness and inflammation of your sinuses. Sinuses are hollow spaces in the bones around your face. This condition is caused by nasal tissues that become inflamed or swollen. The swelling traps or blocks the flow of mucus. This allows bacteria, viruses, and  fungi to grow, which leads to infection. If you were prescribed an antibiotic medicine, take it as told by your health care provider. Do not stop taking the antibiotic even if you start to feel better. Keep all follow-up visits. This is important. This information is not intended to replace advice given to you by your health care provider. Make sure you discuss any questions you have with your health care provider. Document Revised: 01/28/2021 Document Reviewed: 01/28/2021 Elsevier Patient Education  2024 [employer]   If you have been instructed to have an in-person evaluation today at a local Urgent Care facility, please use the link below. It will take you to a list of all of our available [facility], including address, phone number and hours of operation. Please do not delay care.  [facility]  If you or a family member do not have a primary care provider, use the link below to schedule a visit and establish care. When you choose a [facility] primary care physician or advanced practice provider, you gain a long-term partner in health. Find a Primary Care Provider  Learn more about [facility]'s in-office and virtual care options: [facility] - Get Care Now

## 2024-02-10 ENCOUNTER — Other Ambulatory Visit: Payer: Self-pay | Admitting: Obstetrics and Gynecology

## 2024-02-10 DIAGNOSIS — Z1509 Genetic susceptibility to other malignant neoplasm: Secondary | ICD-10-CM

## 2024-02-10 DIAGNOSIS — Z1589 Genetic susceptibility to other disease: Secondary | ICD-10-CM

## 2024-03-06 ENCOUNTER — Other Ambulatory Visit

## 2024-03-15 ENCOUNTER — Other Ambulatory Visit

## 2024-04-11 ENCOUNTER — Other Ambulatory Visit

## 2024-05-10 ENCOUNTER — Other Ambulatory Visit
